# Patient Record
Sex: Female | Born: 1945 | Race: White | Hispanic: No | Smoking: Never smoker
Health system: Southern US, Community
[De-identification: ages and names within clinical notes are randomized; demographics above are authoritative.]

## PROBLEM LIST (undated history)

## (undated) DIAGNOSIS — Z87440 Personal history of urinary (tract) infections: Secondary | ICD-10-CM

## (undated) DIAGNOSIS — I10 Essential (primary) hypertension: Secondary | ICD-10-CM

## (undated) DIAGNOSIS — K222 Esophageal obstruction: Secondary | ICD-10-CM

## (undated) DIAGNOSIS — K219 Gastro-esophageal reflux disease without esophagitis: Secondary | ICD-10-CM

## (undated) DIAGNOSIS — K449 Diaphragmatic hernia without obstruction or gangrene: Secondary | ICD-10-CM

## (undated) DIAGNOSIS — Z85828 Personal history of other malignant neoplasm of skin: Secondary | ICD-10-CM

## (undated) DIAGNOSIS — Z8709 Personal history of other diseases of the respiratory system: Secondary | ICD-10-CM

## (undated) DIAGNOSIS — C801 Malignant (primary) neoplasm, unspecified: Secondary | ICD-10-CM

## (undated) DIAGNOSIS — F329 Major depressive disorder, single episode, unspecified: Secondary | ICD-10-CM

## (undated) DIAGNOSIS — Z973 Presence of spectacles and contact lenses: Secondary | ICD-10-CM

## (undated) DIAGNOSIS — R296 Repeated falls: Secondary | ICD-10-CM

## (undated) DIAGNOSIS — F32A Depression, unspecified: Secondary | ICD-10-CM

## (undated) DIAGNOSIS — S8291XA Unspecified fracture of right lower leg, initial encounter for closed fracture: Secondary | ICD-10-CM

## (undated) DIAGNOSIS — M1990 Unspecified osteoarthritis, unspecified site: Secondary | ICD-10-CM

## (undated) DIAGNOSIS — E785 Hyperlipidemia, unspecified: Secondary | ICD-10-CM

## (undated) DIAGNOSIS — J309 Allergic rhinitis, unspecified: Secondary | ICD-10-CM

## (undated) DIAGNOSIS — J189 Pneumonia, unspecified organism: Secondary | ICD-10-CM

## (undated) HISTORY — DX: Esophageal obstruction: K22.2

## (undated) HISTORY — DX: Essential (primary) hypertension: I10

## (undated) HISTORY — PX: HAND SURGERY: SHX662

## (undated) HISTORY — DX: Major depressive disorder, single episode, unspecified: F32.9

## (undated) HISTORY — DX: Depression, unspecified: F32.A

## (undated) HISTORY — DX: Unspecified osteoarthritis, unspecified site: M19.90

## (undated) HISTORY — PX: BLADDER SUSPENSION: SHX72

## (undated) HISTORY — DX: Hyperlipidemia, unspecified: E78.5

## (undated) HISTORY — PX: LATERAL COLLATERAL LIGAMENT REPAIR, KNEE: SHX1957

## (undated) HISTORY — DX: Allergic rhinitis, unspecified: J30.9

## (undated) HISTORY — PX: TOTAL ANKLE REPLACEMENT: SUR1218

## (undated) HISTORY — PX: TUBAL LIGATION: SHX77

## (undated) HISTORY — DX: Diaphragmatic hernia without obstruction or gangrene: K44.9

---

## 1975-02-25 HISTORY — PX: OTHER SURGICAL HISTORY: SHX169

## 1982-02-24 HISTORY — PX: APPENDECTOMY: SHX54

## 1998-11-07 ENCOUNTER — Other Ambulatory Visit: Admission: RE | Admit: 1998-11-07 | Discharge: 1998-11-07 | Payer: Self-pay | Admitting: Obstetrics and Gynecology

## 1999-10-30 ENCOUNTER — Encounter: Payer: Self-pay | Admitting: Gastroenterology

## 1999-11-07 ENCOUNTER — Other Ambulatory Visit: Admission: RE | Admit: 1999-11-07 | Discharge: 1999-11-07 | Payer: Self-pay | Admitting: Obstetrics and Gynecology

## 2000-06-08 ENCOUNTER — Encounter: Payer: Self-pay | Admitting: *Deleted

## 2000-06-09 ENCOUNTER — Encounter: Payer: Self-pay | Admitting: Orthopaedic Surgery

## 2000-06-09 ENCOUNTER — Inpatient Hospital Stay (HOSPITAL_COMMUNITY): Admission: EM | Admit: 2000-06-09 | Discharge: 2000-06-11 | Payer: Self-pay | Admitting: Emergency Medicine

## 2001-01-07 ENCOUNTER — Other Ambulatory Visit: Admission: RE | Admit: 2001-01-07 | Discharge: 2001-01-07 | Payer: Self-pay | Admitting: Obstetrics and Gynecology

## 2003-09-22 ENCOUNTER — Observation Stay (HOSPITAL_COMMUNITY): Admission: EM | Admit: 2003-09-22 | Discharge: 2003-09-22 | Payer: Self-pay | Admitting: Emergency Medicine

## 2004-04-24 HISTORY — PX: LAPAROSCOPIC CHOLECYSTECTOMY: SUR755

## 2004-04-30 ENCOUNTER — Ambulatory Visit: Payer: Self-pay | Admitting: Gastroenterology

## 2004-05-01 ENCOUNTER — Ambulatory Visit: Payer: Self-pay | Admitting: Gastroenterology

## 2004-05-01 ENCOUNTER — Ambulatory Visit (HOSPITAL_COMMUNITY): Admission: RE | Admit: 2004-05-01 | Discharge: 2004-05-01 | Payer: Self-pay | Admitting: Gastroenterology

## 2004-05-01 DIAGNOSIS — K2990 Gastroduodenitis, unspecified, without bleeding: Secondary | ICD-10-CM

## 2004-05-01 DIAGNOSIS — K2970 Gastritis, unspecified, without bleeding: Secondary | ICD-10-CM | POA: Insufficient documentation

## 2004-05-20 ENCOUNTER — Ambulatory Visit (HOSPITAL_COMMUNITY): Admission: RE | Admit: 2004-05-20 | Discharge: 2004-05-20 | Payer: Self-pay

## 2004-05-20 ENCOUNTER — Encounter (INDEPENDENT_AMBULATORY_CARE_PROVIDER_SITE_OTHER): Payer: Self-pay | Admitting: *Deleted

## 2004-06-13 ENCOUNTER — Ambulatory Visit: Payer: Self-pay | Admitting: Gastroenterology

## 2005-05-23 ENCOUNTER — Ambulatory Visit (HOSPITAL_BASED_OUTPATIENT_CLINIC_OR_DEPARTMENT_OTHER): Admission: RE | Admit: 2005-05-23 | Discharge: 2005-05-23 | Payer: Self-pay | Admitting: Urology

## 2006-03-09 ENCOUNTER — Encounter: Admission: RE | Admit: 2006-03-09 | Discharge: 2006-03-09 | Payer: Self-pay | Admitting: Family Medicine

## 2006-09-17 ENCOUNTER — Ambulatory Visit: Payer: Self-pay | Admitting: Gastroenterology

## 2006-09-17 LAB — CONVERTED CEMR LAB
ALT: 39 units/L — ABNORMAL HIGH (ref 0–35)
Basophils Relative: 0.7 % (ref 0.0–1.0)
HCT: 36 % (ref 36.0–46.0)
Hemoglobin: 12.9 g/dL (ref 12.0–15.0)
Lipase: 20 units/L (ref 11.0–59.0)
MCHC: 35.8 g/dL (ref 30.0–36.0)
MCV: 93.4 fL (ref 78.0–100.0)
Neutro Abs: 2.8 10*3/uL (ref 1.4–7.7)
Platelets: 289 10*3/uL (ref 150–400)
RBC: 3.86 M/uL — ABNORMAL LOW (ref 3.87–5.11)
Total Protein: 7.3 g/dL (ref 6.0–8.3)
WBC: 5.6 10*3/uL (ref 4.5–10.5)

## 2006-09-18 ENCOUNTER — Ambulatory Visit: Payer: Self-pay | Admitting: Gastroenterology

## 2006-10-08 ENCOUNTER — Ambulatory Visit: Payer: Self-pay | Admitting: Gastroenterology

## 2007-05-14 ENCOUNTER — Inpatient Hospital Stay (HOSPITAL_COMMUNITY): Admission: EM | Admit: 2007-05-14 | Discharge: 2007-05-16 | Payer: Self-pay | Admitting: Emergency Medicine

## 2007-06-02 DIAGNOSIS — R1013 Epigastric pain: Secondary | ICD-10-CM | POA: Insufficient documentation

## 2007-06-02 DIAGNOSIS — K449 Diaphragmatic hernia without obstruction or gangrene: Secondary | ICD-10-CM | POA: Insufficient documentation

## 2007-06-02 DIAGNOSIS — E7800 Pure hypercholesterolemia, unspecified: Secondary | ICD-10-CM | POA: Insufficient documentation

## 2007-06-02 DIAGNOSIS — K219 Gastro-esophageal reflux disease without esophagitis: Secondary | ICD-10-CM | POA: Insufficient documentation

## 2007-06-02 DIAGNOSIS — J309 Allergic rhinitis, unspecified: Secondary | ICD-10-CM | POA: Insufficient documentation

## 2007-06-02 DIAGNOSIS — I10 Essential (primary) hypertension: Secondary | ICD-10-CM | POA: Insufficient documentation

## 2007-07-05 ENCOUNTER — Ambulatory Visit (HOSPITAL_COMMUNITY): Admission: RE | Admit: 2007-07-05 | Discharge: 2007-07-05 | Payer: Self-pay | Admitting: Cardiology

## 2007-07-05 ENCOUNTER — Ambulatory Visit: Payer: Self-pay | Admitting: Vascular Surgery

## 2007-07-05 ENCOUNTER — Encounter (INDEPENDENT_AMBULATORY_CARE_PROVIDER_SITE_OTHER): Payer: Self-pay | Admitting: Cardiology

## 2009-01-17 ENCOUNTER — Ambulatory Visit (HOSPITAL_COMMUNITY): Admission: RE | Admit: 2009-01-17 | Discharge: 2009-01-19 | Payer: Self-pay | Admitting: Orthopedic Surgery

## 2010-04-09 ENCOUNTER — Encounter (INDEPENDENT_AMBULATORY_CARE_PROVIDER_SITE_OTHER): Payer: Self-pay | Admitting: *Deleted

## 2010-04-17 NOTE — Letter (Signed)
Summary: New Patient letter  [facility]  [address] [place], [place] [address]   Phone: [phone]  Fax: [phone]       04/09/2010 MRN: [id number]  [facility] [address] [place], [place]  [address]  Dear Ms. Rathe,  Welcome to the Gastroenterology Division at [facility].    You are scheduled to see Dr. Sheryn Bison on May 16, 2010 at 9:45am on the 3rd floor at [facility], [address]  We ask that you try to arrive at our office 15 minutes prior to your appointment time to allow for check-in.  We would like you to complete the enclosed self-administered evaluation form prior to your visit and bring it with you on the day of your appointment.  We will review it with you.  Also, please bring a complete list of all your medications or, if you prefer, bring the medication bottles and we will list them.  Please bring your insurance card so that we may make a copy of it.  If your insurance requires a referral to see a specialist, please bring your referral form from your primary care physician.  Co-payments are due at the time of your visit and may be paid by cash, check or credit card.     Your office visit will consist of a consult with your physician (includes a physical exam), any laboratory testing he/she may order, scheduling of any necessary diagnostic testing (e.g. x-ray, ultrasound, CT-scan), and scheduling of a procedure (e.g. Endoscopy, Colonoscopy) if required.  Please allow enough time on your schedule to allow for any/all of these possibilities.    If you cannot keep your appointment, please call [phone] to cancel or reschedule prior to your appointment date.  This allows [place] the opportunity to schedule an appointment for another patient in need of care.  If you do not cancel or reschedule by 5 p.m. the business day prior to your appointment date, you will be charged a $50.00 late cancellation/no-show fee.    Thank  you for choosing [facility] for your medical needs.  We appreciate the opportunity to care for you.  Please visit [place] at our website  to learn more about our practice.                     Sincerely,                                                             The Gastroenterology Division

## 2010-05-16 ENCOUNTER — Other Ambulatory Visit (INDEPENDENT_AMBULATORY_CARE_PROVIDER_SITE_OTHER): Payer: 59

## 2010-05-16 ENCOUNTER — Encounter: Payer: Self-pay | Admitting: Gastroenterology

## 2010-05-16 ENCOUNTER — Other Ambulatory Visit (INDEPENDENT_AMBULATORY_CARE_PROVIDER_SITE_OTHER): Payer: 59 | Admitting: Gastroenterology

## 2010-05-16 ENCOUNTER — Ambulatory Visit (INDEPENDENT_AMBULATORY_CARE_PROVIDER_SITE_OTHER): Payer: 59 | Admitting: Gastroenterology

## 2010-05-16 ENCOUNTER — Encounter: Payer: Self-pay | Admitting: *Deleted

## 2010-05-16 DIAGNOSIS — Z72820 Sleep deprivation: Secondary | ICD-10-CM

## 2010-05-16 DIAGNOSIS — K589 Irritable bowel syndrome without diarrhea: Secondary | ICD-10-CM

## 2010-05-16 DIAGNOSIS — R079 Chest pain, unspecified: Secondary | ICD-10-CM

## 2010-05-16 DIAGNOSIS — K219 Gastro-esophageal reflux disease without esophagitis: Secondary | ICD-10-CM

## 2010-05-16 DIAGNOSIS — Z7689 Persons encountering health services in other specified circumstances: Secondary | ICD-10-CM

## 2010-05-16 DIAGNOSIS — R159 Full incontinence of feces: Secondary | ICD-10-CM

## 2010-05-16 LAB — IBC PANEL
Saturation Ratios: 23.5 % (ref 20.0–50.0)
Transferrin: 304.5 mg/dL (ref 212.0–360.0)

## 2010-05-16 LAB — BASIC METABOLIC PANEL
CO2: 22 mEq/L (ref 19–32)
Chloride: 106 mEq/L (ref 96–112)
Creatinine, Ser: 0.8 mg/dL (ref 0.4–1.2)
Potassium: 4 mEq/L (ref 3.5–5.1)
Sodium: 136 mEq/L (ref 135–145)

## 2010-05-16 LAB — CBC WITH DIFFERENTIAL/PLATELET
Basophils Relative: 0.7 % (ref 0.0–3.0)
Eosinophils Absolute: 0.1 10*3/uL (ref 0.0–0.7)
Eosinophils Relative: 1.9 % (ref 0.0–5.0)
HCT: 39.3 % (ref 36.0–46.0)
Lymphs Abs: 2.5 10*3/uL (ref 0.7–4.0)
MCHC: 35.3 g/dL (ref 30.0–36.0)
MCV: 93.8 fl (ref 78.0–100.0)
Monocytes Absolute: 0.3 10*3/uL (ref 0.1–1.0)
Platelets: 254 10*3/uL (ref 150.0–400.0)
WBC: 5.6 10*3/uL (ref 4.5–10.5)

## 2010-05-16 LAB — HEPATIC FUNCTION PANEL
ALT: 37 U/L — ABNORMAL HIGH (ref 0–35)
AST: 28 U/L (ref 0–37)
Bilirubin, Direct: 0.1 mg/dL (ref 0.0–0.3)
Total Bilirubin: 0.8 mg/dL (ref 0.3–1.2)

## 2010-05-16 LAB — SEDIMENTATION RATE: Sed Rate: 15 mm/hr (ref 0–22)

## 2010-05-16 LAB — FERRITIN: Ferritin: 169 ng/mL (ref 10.0–291.0)

## 2010-05-16 LAB — TSH: TSH: 1.39 u[IU]/mL (ref 0.35–5.50)

## 2010-05-16 MED ORDER — ESOMEPRAZOLE MAGNESIUM 40 MG PO PACK: 40.0000 mg | PACK | Freq: Two times a day (BID) | ORAL | Status: DC

## 2010-05-16 MED ORDER — PEG-KCL-NACL-NASULF-NA ASC-C 100 G PO SOLR: 1.0000 | Freq: Once | ORAL | Status: AC

## 2010-05-16 MED ORDER — SUCRALFATE 1 GM/10ML PO SUSP: ORAL | Status: DC

## 2010-05-16 NOTE — Progress Notes (Signed)
History of Present Illness:  This is a  Pleasant 65 year old white female with multiple GI complaints. [name] saw her in 2006 at which time she had endoscopy and colonoscopy which were fairly unremarkable. At that time she was diagnosed as having symptomatic cholelithiasis and underwent laparoscopic cholecystectomy with Dr. Orson Slick. She currently is having rather severe substernal chest pain radiating  Into her neck and has intermittent solid food dysphagia, hoarseness, choking, S. Of cardiac evaluation by Dr. Julieanne Manson has been unremarkable. She also has insomnia A. Chronic fatigue and probably has sleep apnea. She denies anorexia or weight loss, for does have alternating diarrhea and constipation consistent with irritable bowel syndrome. She denies systemic or hepatobiliary complaints. She also denies any specific food intolerances.    she has noted intermittent fecal incontinency  Has had one child some 40 years ago. She denies rectal trauma or rectal surgery. She has depression and is currently on Effexor 75 mg 3 times a day per primary care. She has been on Nexium 40 mg a day for several years. Family history is noncontributory.    ROS: The remainder of the 8 point ROS is negative. There are multiple blunt complaints list of outpatient including anxiety and depression, low back pain, confusion, fatigue, nonspecific headaches, arise, shortness of breath with exertion, insomnia, swelling of her legs and feet, excessive thirst, excessive urination, horness, and occasional fecal and urine incontinency.    Past Medical History  Diagnosis Date  . Unspecified gastritis and gastroduodenitis without mention of hemorrhage   . Hiatal hernia   . Allergic rhinitis, cause unspecified   . Pure hypercholesterolemia   . Unspecified essential hypertension   . Esophageal reflux   . Abdominal pain, epigastric   . Arthritis   . Esophageal stenosis    Past Surgical History  Procedure Date  . Appendectomy     . Tubal ligation     76 reversal in 81 and tubal in 85  . Laparoscopic cholecystectomy 04/2004  . Laporoscopy 1977    ovarian cyst    reports that she has never smoked. She has never used smokeless tobacco. She reports that she drinks alcohol. She reports that she does not use illicit drugs. family history includes Diabetes in her sister; Heart disease in her brother; and Stomach cancer (age of onset:70) in her brother.  There is no history of Colon cancer. No Known Allergies     Physical Exam: General well developed well nourished patient in no acute distress, appearing their stated age Eyes PERRLA, no icterus fundoscopic exam per opthamologist Skin no lesions noted Neck supple, no adenopathy, no thyroid enlargement, no tenderness Chest clear to percussion and auscultation Heart no significant murmurs, gallops or rubs noted Abdomen no hepatosplenomegaly masses or tenderness, BS normal.  Rectal inspection normal no fissures, or fistulae noted.  No masses or tenderness on digital exam. Stool guaiac negative. There is reduced rectal tone and squeeze pressure noted. Extremities no acute joint lesions, edema, phlebitis or evidence of cellulitis. Neurologic patient oriented x 3, cranial nerves intact, no focal neurologic deficits noted. Psychological mental status normal and normal affect. 1 Assessment and plan: this patient has chronic IBS with alternating diarrhea and constipation. She also has gross severe acid reflux unresponsive to Nexium therapy. I have scheduled endoscopy and will screen for celiac disease, H. Pylori, and eosinophilic esophagitis. Increased her Nexium to 40 mg twice a day with when necessary Carafate suspension PC and each bedtime. followup colonoscopy also scheduled per her her abdominal symptomatology  most consistent with IBS. As mentioned above she is status post cholecystectomy. Symptomatology also is consistent with sleep apnea, I have scheduled her appointment Dr.  Shelle Iron  4 consideration of sleep apnea studies. Vascular not be within 2 hours of going to bed at night with other standard antireflux maneuvers. Numerous screening laboratory parameters also been ordered 4 review.

## 2010-05-16 NOTE — Patient Instructions (Signed)
Please go to the basement today for your labs.  Your prescription(s) have been sent to you pharmacy.  Your procedure has been scheduled for 05/17/2010, please follow the seperate instructions.  We have Scheduled you to see Dr Shelle Iron to discuss having a sleep study on 06/14/2010 arrive at 11:30am if you can not keep this appt please call [phone] to reschedule or cancel 24 hours in advance.  Increase your Nexium to twice a day dosing, a new rx has been sent.

## 2010-05-17 ENCOUNTER — Encounter: Payer: Self-pay | Admitting: Gastroenterology

## 2010-05-17 ENCOUNTER — Ambulatory Visit (AMBULATORY_SURGERY_CENTER): Payer: 59 | Admitting: Gastroenterology

## 2010-05-17 DIAGNOSIS — K222 Esophageal obstruction: Secondary | ICD-10-CM

## 2010-05-17 DIAGNOSIS — K449 Diaphragmatic hernia without obstruction or gangrene: Secondary | ICD-10-CM

## 2010-05-17 DIAGNOSIS — K589 Irritable bowel syndrome without diarrhea: Secondary | ICD-10-CM

## 2010-05-17 DIAGNOSIS — R1310 Dysphagia, unspecified: Secondary | ICD-10-CM

## 2010-05-17 DIAGNOSIS — R109 Unspecified abdominal pain: Secondary | ICD-10-CM

## 2010-05-17 DIAGNOSIS — Z1211 Encounter for screening for malignant neoplasm of colon: Secondary | ICD-10-CM

## 2010-05-17 DIAGNOSIS — K5730 Diverticulosis of large intestine without perforation or abscess without bleeding: Secondary | ICD-10-CM

## 2010-05-17 DIAGNOSIS — K219 Gastro-esophageal reflux disease without esophagitis: Secondary | ICD-10-CM

## 2010-05-17 DIAGNOSIS — K209 Esophagitis, unspecified without bleeding: Secondary | ICD-10-CM

## 2010-05-17 LAB — GLIA (IGA/G) + TTG IGA: Gliadin IgG: 6.2 U/mL (ref <20)

## 2010-05-17 NOTE — Patient Instructions (Signed)
Reviewed blue and green discharge sheets with patient and care partner.  Care partner signed discharge sheets.  Teaching Materials: Diverticulosis, High Fiber, GERD, Esophagitis, Dilation Diet  Findings:  Normal Colon except for the presence of diverticulosis.                  Recommended follow up 10 years.                  Hiatal Hernia, Esophagitis, Chronic GERD                  Probable stricture - dilated

## 2010-05-20 ENCOUNTER — Telehealth: Payer: Self-pay | Admitting: *Deleted

## 2010-05-20 DIAGNOSIS — R109 Unspecified abdominal pain: Secondary | ICD-10-CM

## 2010-05-20 LAB — HELICOBACTER PYLORI SCREEN-BIOPSY: UREASE: NEGATIVE

## 2010-05-20 NOTE — Telephone Encounter (Signed)

## 2010-05-22 ENCOUNTER — Encounter: Payer: Self-pay | Admitting: Gastroenterology

## 2010-05-23 NOTE — Procedures (Signed)
Summary: Colonoscopy  Patient: [name] Note: All result statuses are Final unless otherwise noted.  Tests: (1) Colonoscopy (COL)   COL Colonoscopy           DONE     [facility]     [address]     [place], [place]  [address]          COLONOSCOPY PROCEDURE REPORT          PATIENT:  [name], [name]  MR#:  [id number]     BIRTHDATE:  1945/08/11, 64 yrs. old  GENDER:  female     ENDOSCOPIST:  Vania Rea. Jarold Motto, MD, [facility]     REF. BY:     PROCEDURE DATE:  05/17/2010     PROCEDURE:  Average-risk screening colonoscopy     G0121     ASA CLASS:  Class II     INDICATIONS:  Routine Risk Screening     MEDICATIONS:   Fentanyl 75 mcg IV, Versed 10 mg IV          DESCRIPTION OF PROCEDURE:   After the risks benefits and     alternatives of the procedure were thoroughly explained, informed     consent was obtained.  Digital rectal exam was performed and     revealed no abnormalities.   The LB CF-H180AL [id number] endoscope     was introduced through the anus and advanced to the cecum, which     was identified by both the appendix and ileocecal valve, without     limitations.  The quality of the prep was excellent, using     MoviPrep.  The instrument was then slowly withdrawn as the colon     was fully examined.     <<PROCEDUREIMAGES>>          FINDINGS:  Mild diverticulosis was found in the sigmoid colon.     This was otherwise a normal examination of the colon.  No polyps or     cancers were seen.   Retroflexed views in the rectum revealed no     abnormalities.    The scope was then withdrawn from the patient     and the procedure completed.          COMPLICATIONS:  None     ENDOSCOPIC IMPRESSION:     1) Mild diverticulosis in the sigmoid colon     2) Otherwise normal examination     RECOMMENDATIONS:     1) Continue current colorectal screening recommendations for     "routine risk" patients with a repeat colonoscopy in 10 years.     REPEAT EXAM:  No       ______________________________     Vania Rea. Jarold Motto, MD, [facility]          CC:          n.     eSIGNED:   Vania Rea. Patterson at 05/17/2010 02:58 PM          [place], Beckstrom Dundarrach, [phone]  Note: An exclamation mark (!) indicates a result that was not dispersed into the flowsheet. Document Creation Date: 05/17/2010 2:59 PM _______________________________________________________________________  (1) Order result status: Final Collection or observation date-time: 05/17/2010 14:53 Requested date-time:  Receipt date-time:  Reported date-time:  Referring Physician:   Ordering Physician: Sheryn Bison [phone]) Specimen Source:  Source: Launa Grill Order Number: [id number] Lab site:

## 2010-05-23 NOTE — Procedures (Signed)
Summary: Upper Endoscopy  Patient: [name] Note: All result statuses are Final unless otherwise noted.  Tests: (1) Upper Endoscopy (EGD)   EGD Upper Endoscopy       DONE     [facility]     [address]     [place], [place]  [address]          ENDOSCOPY PROCEDURE REPORT          PATIENT:  [name], [name]  MR#:  [id number]     BIRTHDATE:  [birth year], 64 yrs. old  GENDER:  female          ENDOSCOPIST:  Vania Rea. Jarold Motto, MD, [facility]     Referred by:          PROCEDURE DATE:  05/17/2010     PROCEDURE:  EGD with biopsy, [address], Maloney Dilation of Esophagus     ASA CLASS:  Class II     INDICATIONS:  GERD, dysphagia          MEDICATIONS:   There was residual sedation effect present from     prior procedure., Fentanyl 25 mcg IV, Versed 3 mg IV     TOPICAL ANESTHETIC:  Exactacain Spray          DESCRIPTION OF PROCEDURE:   After the risks benefits and     alternatives of the procedure were thoroughly explained, informed     consent was obtained.  The LB GIF-H180 [id number] endoscope was     introduced through the mouth and advanced to the second portion of     the duodenum, without limitations.  The instrument was slowly     withdrawn as the mucosa was fully examined.     <<PROCEDUREIMAGES>>          A hiatal hernia was found.  Esophagitis was found at the     gastroesophageal junction. DILATED #21F MALONEY.TOLERATED WELL.     Normal duodenal folds were noted. si bx. done.    Retroflexed views     revealed a hiatal hernia.    The scope was then withdrawn from the     patient and the procedure completed.          COMPLICATIONS:  None          ENDOSCOPIC IMPRESSION:     1) Hiatal hernia     2) Esophagitis at the gastroesophageal junction     3) Normal duodenal folds     4) A hiatal hernia     CHRONIC GERD.PROBABLE STRICTURE DILATED.     RECOMMENDATIONS:     1) Await biopsy results     2) Clear liquids until, then soft foods rest iof day. Resume  prior diet tomorrow.     3) Rx CLO if positive     4) continue current medications          REPEAT EXAM:  No          ______________________________     Vania Rea. Jarold Motto, MD, [facility]          CC:          n.     eSIGNED:   Vania Rea. Kadee Philyaw at 05/17/2010 03:29 PM          [name], [name], [phone]  Note: An exclamation mark (!) indicates a result that was not dispersed into the flowsheet. Document Creation Date: 05/17/2010 3:29 PM _______________________________________________________________________  (1) Order result status: Final Collection or observation date-time: 05/17/2010 15:14 Requested date-time:  Receipt date-time:  Reported date-time:  Referring Physician:   Ordering Physician: Sheryn Bison [id number]) Specimen Source:  Source: Launa Grill Order Number: [id number] Lab site:

## 2010-05-24 ENCOUNTER — Telehealth: Payer: Self-pay | Admitting: Gastroenterology

## 2010-05-24 NOTE — Telephone Encounter (Signed)
Advised pt that her bx is neg for h pylori

## 2010-05-27 ENCOUNTER — Encounter: Payer: 59 | Admitting: Gastroenterology

## 2010-05-29 LAB — CBC
HCT: 38.4 % (ref 36.0–46.0)
Hemoglobin: 11.8 g/dL — ABNORMAL LOW (ref 12.0–15.0)
Hemoglobin: 13.3 g/dL (ref 12.0–15.0)
MCHC: 34.7 g/dL (ref 30.0–36.0)
MCV: 96.6 fL (ref 78.0–100.0)
Platelets: 258 10*3/uL (ref 150–400)
RBC: 3.48 MIL/uL — ABNORMAL LOW (ref 3.87–5.11)
RBC: 3.98 MIL/uL (ref 3.87–5.11)
RDW: 13.2 % (ref 11.5–15.5)
RDW: 13.4 % (ref 11.5–15.5)
WBC: 7 10*3/uL (ref 4.0–10.5)

## 2010-05-29 LAB — BASIC METABOLIC PANEL
BUN: 7 mg/dL (ref 6–23)
CO2: 25 mEq/L (ref 19–32)
Calcium: 8.8 mg/dL (ref 8.4–10.5)
Calcium: 9.7 mg/dL (ref 8.4–10.5)
Chloride: 106 mEq/L (ref 96–112)
Creatinine, Ser: 0.8 mg/dL (ref 0.4–1.2)
GFR calc Af Amer: >60 mL/min (ref >60)
GFR calc Af Amer: >60 mL/min (ref >60)
GFR calc non Af Amer: >60 mL/min (ref >60)
GFR calc non Af Amer: >60 mL/min (ref >60)
Glucose, Bld: 85 mg/dL (ref 70–99)
Potassium: 4.3 mEq/L (ref 3.5–5.1)
Sodium: 135 mEq/L (ref 135–145)
Sodium: 139 mEq/L (ref 135–145)

## 2010-05-29 LAB — D-DIMER, QUANTITATIVE: D-Dimer, Quant: 1.18 ug/mL-FEU — ABNORMAL HIGH (ref 0.00–0.48)

## 2010-05-29 LAB — BRAIN NATRIURETIC PEPTIDE: Pro B Natriuretic peptide (BNP): 64 pg/mL (ref 0.0–100.0)

## 2010-06-14 ENCOUNTER — Ambulatory Visit (INDEPENDENT_AMBULATORY_CARE_PROVIDER_SITE_OTHER): Payer: 59 | Admitting: Pulmonary Disease

## 2010-06-14 ENCOUNTER — Encounter: Payer: Self-pay | Admitting: Pulmonary Disease

## 2010-06-14 DIAGNOSIS — F5104 Psychophysiologic insomnia: Secondary | ICD-10-CM

## 2010-06-14 DIAGNOSIS — G4700 Insomnia, unspecified: Secondary | ICD-10-CM | POA: Insufficient documentation

## 2010-06-14 NOTE — Patient Instructions (Signed)
Start trazodone 50mg  2 tabs one or two hrs before bedtime Move bedtime to 11-12am, do not read or watch tv in bed If you cannot initiate sleep within , leave bedroom and watch tv or read in family room.  Do not use computer after 10pm.  Can return to bedroom when you begin to get sleepy.   Get up each am by 7:30, no napping during day, stay out of bedroom during day, and no exercise within 4 hrs of bedtime.  Work on weight loss If we can get you sleeping better, will do testing for sleep apnea.

## 2010-06-14 NOTE — Progress Notes (Signed)
  Subjective:    Patient ID: [name], female    DOB: 1945-07-11, 65 y.o.   MRN: [id number]  HPI The pt is a 64y/o female who I have been asked to see for sleep issues that she believes are due to insomnia.  She has had this for many years.  She goes to bed btw 9-10pm, and will usually read in bed.  She may take up to 3 hrs to fall asleep, or may never fall asleep the whole night.  She will usually give herself 30-31min to fall asleep while in bed, but then will then get up and go to family room to watch tv, read, or get on computer.  She will typically return to bed at 1am, will toss and turn until 2-3am, then will fall asleep until 730am 3/7 nights.  4/7 nights she continues to toss and turn until it is time to start her day.  She has a sense of frustration about going to sleep, and feels she cannot turn off her racing mind.  She has been tried on antihistamines, trazodone, and ambien without success.  She has been noted to have snoring, but never witnessed apneas.  She denies significant leg kicking or RLS symptoms.  She denies sleepiness during the day, and never takes naps.  She has no pets, and denies her husband snores.  She is not rested upon arising in the am's.  Her weight is up about 16 pounds over the last 31yrs.     Review of Systems  Constitutional: Positive for unexpected weight change. Negative for fever.  HENT: Positive for congestion, rhinorrhea, sneezing, postnasal drip and sinus pressure. Negative for ear pain, nosebleeds, sore throat, trouble swallowing and dental problem.   Eyes: Positive for redness and itching.  Respiratory: Positive for cough, chest tightness, shortness of breath and wheezing.   Cardiovascular: Negative for palpitations and leg swelling.  Gastrointestinal: Positive for nausea. Negative for vomiting.  Genitourinary: Negative for dysuria.  Musculoskeletal: Positive for joint swelling.  Skin: Positive for rash.  Neurological: Positive for headaches.    Hematological: Does not bruise/bleed easily.  Psychiatric/Behavioral: Positive for dysphoric mood. The patient is nervous/anxious.        Objective:   Physical Exam Constitutional:  Obese female, no acute distress  HENT:  Nares patent without discharge, but mildly narrowed  Oropharynx without exudate, palate and uvula are not significantly abnormal  Eyes:  Perrla, eomi, no scleral icterus  Neck:  No JVD, no TMG  Cardiovascular:  Normal rate, regular rhythm, no rubs or gallops.  No murmurs        Intact distal pulses  Pulmonary :  Normal breath sounds, no stridor or respiratory distress   No rales, rhonchi, or wheezing  Abdominal:  Soft, nondistended, bowel sounds present.  No tenderness noted.   Musculoskeletal:  No lower extremity edema noted.  Lymph Nodes:  No cervical lymphadenopathy noted  Skin:  No cyanosis noted  Neurologic:  Alert, appropriate, moves all 4 extremities without obvious deficit.         Assessment & Plan:

## 2010-06-18 ENCOUNTER — Telehealth: Payer: Self-pay | Admitting: Gastroenterology

## 2010-06-18 NOTE — Telephone Encounter (Signed)
LMOM for pt to call back.

## 2010-06-19 NOTE — Telephone Encounter (Signed)
Pt received a bill for 05/17/10 procedures-ECL- from Dr Arlyce Dice and wondered if he "put her to sleep"?  Dr Jarold Motto performed and signed off on the procedures, billing must have made a mistake. Pt will call the number listed for billing problems.

## 2010-06-23 NOTE — Assessment & Plan Note (Signed)
The pt is describing pyschophysiologic insomnia, and I have explained to her this is a learned behavior.  It is unclear if she has another sleep d/o superimposed such as osa or rls, but will have to treat her insomnia first and see how she responds.  I have told her that sleeping medications are never the answer for her problems, but may help short term when combined with behavioral therapy (the mainstay of treatment).  I have reviewed both stimulus control therapy and sleep restriction with her, as well as what constitutes good sleep hygiene.  She will try some of these techniques over the next 3 weeks to see if they help her.  If she is not making any progress, would recommend referral to behavioral specialist for cognitive behavioral therapy.

## 2010-06-28 ENCOUNTER — Ambulatory Visit: Payer: 59 | Admitting: Gastroenterology

## 2010-07-05 ENCOUNTER — Ambulatory Visit: Payer: 59 | Admitting: Pulmonary Disease

## 2010-07-09 NOTE — H&P (Signed)
[name]             ACCOUNT NO.:  [id number]   MEDICAL RECORD NO.:  [id number]          PATIENT TYPE:  INP   LOCATION:  1825                         FACILITY:  [facility]   PHYSICIAN:  Lonia Blood, M.D.DATE OF BIRTH:  10/08/1945   DATE OF ADMISSION:  05/14/2007  DATE OF DISCHARGE:                              HISTORY & PHYSICAL   PRIMARY CARE PHYSICIAN:  Dr. Bayard Beaver. Spear.   CHIEF COMPLAINT:  Left facial weakness and slurred speech.   HISTORY OF PRESENT ILLNESS:  [name] is a 65 year old female  with medical history as detailed below.  She was in her usual state of  health until today at 2:00 p.m.  She began to experience the sudden  onset of left-sided facial numbness.  This was affecting the entire left  side of her face.  Shortly thereafter she began to feel a facial droop  involving the left lateral aspect of the upper lip.  Shortly thereafter  she felt that her speech was slurred.  No one witnessed this directly.  She did speak multiple family members on the phone, however, who did  report that her speech was abnormal and that she did in fact sound to be  slurring her speech.  The patient reports that she had been recovering  from a viral upper respiratory infection lately.  She also has had some  intermittent left-sided flank and upper abdominal pain which she says is  subacute/semichronic.  The patient has had no chest pain.  There have  been no fevers or chills.  There has been no nausea or vomiting.  The  patient has not had similar symptoms.  There was no loss of sensation in  the arms or legs and no loss of strength in arms or legs.  There was no  gait abnormalities.  There was no loss of consciousness.   REVIEW OF SYSTEMS:  The patient reports that she had been severely  stressed.  Family members support this is well.  She also reports that  she is recently recovering from a viral upper respiratory type symptoms.  She also reports migratory  left low back, left lower quadrant and left  upper back crampy type musculoskeletal pains.  Comprehensive review of  systems is otherwise unremarkable.   PAST MEDICAL HISTORY:  1. Hyperlipidemia.  2. Hypertension.  3. Hiatal hernia with gastroesophageal reflux disease.  4. Open reduction and internal fixation of left trimalleolar ankle      fracture.  5. Status post cholecystectomy.  6. Degenerative joint disease of both knees.  7. Anxiety disorder.   OUTPATIENT MEDICATIONS:  1. Allegra daily.  2. Aspirin 81 mg daily.  3. Crestor 10 mg daily.  4. Effexor XR 37.5 mg daily.  5. Lisinopril 40 mg daily.  6. Nasonex daily.  7. Nexium daily.  8. Calcium plus D daily.  9. Xanax 0.5 mg b.i.d. p.r.n.   ALLERGIES:  NO KNOWN DRUG ALLERGIES.   FAMILY HISTORY:  The patient's family history is reviewed and is not  directly contributory to this admission.   SOCIAL HISTORY:  The patient is married.  She has a son.  She does not  smoke.  She does not drink alcohol to excess but does occasionally  partake of alcohol.   LAB REVIEW:  CBC is unremarkable.  Metabolic panel is unremarkable.  LFTs are unremarkable.  Albumin 4.2.  Coags are normal.  Point of care  cardiac markers are negative times one.  Alcohol level is 18 at time of  presentation.  CT scan of the head reveals no acute disease.   PHYSICAL EXAMINATION:  Temperature 97.2, blood pressure 147/73, heart  rate 92, respiratory rate 22, O2 saturation 98% on 3 liters per minute  nasal cannula.  GENERAL - Well-developed, obese female in no acute  respiratory distress.  HEENT: Normocephalic, atraumatic.  Pupils equal,  round, reactive to light and accommodation.  Extraocular muscles intact  bilaterally.  OC/OP clear.  NECK - No JVD.  No lymphadenopathy, no  thyromegaly.  LUNGS:  Clear to auscultation bilaterally without wheeze  or rhonchi.  CARDIOVASCULAR:  Regular rate and rhythm without murmur,  gallop or rub.  Normal S1 and S2.   ABDOMEN - Obese, soft, bowel sounds  present, no hepatosplenomegaly, no rebound or ascites.  EXTREMITIES:  There is no significant cyanosis, clubbing and edema bilateral lower  extremities.  NEUROLOGIC - Alert and oriented x4.  Cranial II-XII intact  bilaterally, 5/5 strength bilateral upper and lower extremities.  Intact  sensation judged throughout the Babinski.   ASSESSMENT/PLAN:  1. Left-sided facial weakness, slurred speech and numbness - Ms.      Jacobs presents with symptoms that are of concern for a transient      ischemic attack.  She will be admitted to rule out TIA.  She will      receive a full workup to include MRI and MRA of the head.  Must      also consider the possibility of a Bell's palsy.  Family reports a      significant history of severe anxiety/stress.  The possibility of      conversion disorder should also be considered, however I am      concerned that her symptoms are most consistent with a TIA.  We      will add Plavix to her typical aspirin a day regimen and follow up      lab results.  2. Hyperlipidemia - we will continue the patient's Crestor.  We will      check her fasting lipid panel and adjust her treatment as      necessary.  3. Hypertension - we will continue the patient's outpatient medical      regimen and follow her blood pressure closely.  4. Gastroesophageal reflux disease - we will dose the patient with      Protonix.  5. Anxiety disorder - we will continue Xanax and we will follow her      clinical course.      Lonia Blood, M.D.  Electronically Signed     JTM/MEDQ  D:  05/14/2007  T:  05/14/2007  Job:  [id number]

## 2010-07-09 NOTE — Assessment & Plan Note (Signed)
[facility]                         GASTROENTEROLOGY OFFICE NOTE   NAME:Folts, TACOYA ALTIZER                    MRN:          [id number]  DATE:09/17/2006                            DOB:          October 21, 1945    Ms. Whitacre is status post laparoscopic cholecystectomy in March of  2006.  She has done well since that time and has had chronic acid  reflux, for which she takes daily Nexium.  She recently, over the last  several months, has had a recurrent knot-like sensation in her right  upper quadrant with rather persistent nausea.  She has also had some  postoperative loose stools and diarrhea, especially if she eats fatty  foods.  She has had no melena or hematochezia, fever, chills, or any  specific hepatobiliary complaints.  Her appetite is good and her weight  is stable.  She denies true reflux or dysphagia.  She specifically  denies clay-colored stools, dark urine, icterus, fever, or chills.   MEDICATIONS:  1. In addition to her Nexium.  2. She takes daily Effexor.  3. Allegra.  4. Multivitamins.  5. Quinapril - hydrochlorothiazide 20/12.5 mg daily.   EXAM:  She is a healthy-appearing white female in no distress, appearing  her stated age.  She weighs 198 pounds and blood pressure is 118/82.  Pulse was 72 and  regular.  I could not appreciate stigmata of chronic liver disease.  Her abdominal  exam showed no real hepatosplenomegaly, and I could not feel any  abnormal mass in the right upper quadrant.  Abdominal exam otherwise is  normal and bowel sounds were normal.   ASSESSMENT:  Ms. Sachs has somewhat unusual pain, which really does  not sound like biliary colic.  As mentioned above, she is on regular  acid suppressive therapy and does not abuse NSAID.  She did have  previous colonoscopy 2 years ago that was unremarkable.   RECOMMENDATIONS:  1. Check liver profile, amylase, lipase, and CBC, along with upper      abdominal ultrasound exam to  exclude retained common duct stone.  2. Trial of Colestid 1 g mid morning for her bile-salt diarrhea.  3. Check in 2 to 3 weeks' time or p.r.n. depending on her clinical      workup and response.     Vania Rea. Jarold Motto, MD, Caleen Essex, FAGA  Electronically Signed    DRP/MedQ  DD: 09/17/2006  DT: 09/17/2006  Job #: [id number]   cc:   Lavonda Jumbo, M.D.  Lebron Conners, M.D.

## 2010-07-09 NOTE — Assessment & Plan Note (Signed)
[facility]                         GASTROENTEROLOGY OFFICE NOTE   NAME:[name], [name]                    MRN:          [id number]  DATE:10/08/2006                            DOB:          1945-10-01    Ms. Sollars's GI workup history been negative, including an ultrasound  of the abdomen with good pancreatic visualization.  She is concerned  because her brother died of pancreatic cancer.  Is status post  cholecystectomy and repeat ultrasound showed no evidence of common bile  duct enlargement.  Her liver tests were all normal except for an SGPT of  39, but she does have fatty infiltration of the liver associated with  her metabolic syndrome.   Her exam today shows rather exquisite tenderness over the left anterior  rib area.  Abdominal exam otherwise was unremarkable.  Vital signs were  normal.   ASSESSMENT:  I think Ms. 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   RECOMMENDATIONS:  1. Celebrex 200 mg twice a day while continuing Nexium therapy.  2. Orthopedic referral to Dr. Ranee Gosselin for consideration of      steroid injection to the costochondral area.     Vania Rea. Jarold Motto, MD, Caleen Essex, FAGA  Electronically Signed    DRP/MedQ  DD: 10/08/2006  DT: 10/09/2006  Job #: [id number]   cc:   Georges Lynch. Darrelyn Hillock, M.D.  Lavonda Jumbo, M.D.  Lebron Conners, M.D.

## 2010-07-09 NOTE — Discharge Summary (Signed)
Cassandra, Jacobs             ACCOUNT NO.:  [id number]   MEDICAL RECORD NO.:  [id number]          PATIENT TYPE:  INP   LOCATION:  3017                         FACILITY:  [facility]   PHYSICIAN:  Lonia Blood, M.D.DATE OF BIRTH:  Nov 06, 1945   DATE OF ADMISSION:  05/14/2007  DATE OF DISCHARGE:  05/16/2007                               DISCHARGE SUMMARY   PRIMARY CARE PHYSICIAN:  Tammy R. Collins Scotland, M.D.   DISCHARGE DIAGNOSES:  1. Left-sided facial weakness and slurred speech.      a.     Possible right brain transient ischemic attack.      b.     Complete workup negative for history for cerebrovascular       accident.      c.     Atherosclerotic disease noted with no focal stenosis.      d.     Plavix added to aspirin therapy with increase of lipid       lowering agent.      e.     Outpatient echocardiogram within 5-7 days recommended to       complete a workup.  2. Hypertension.  3. Gastroesophageal reflux disease with hiatal hernia.  4. Status post open reduction and internal fixation left trimalleolar      ankle fracture.  5. Status post cholecystectomy.  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  7. Anxiety disorder.   DISCHARGE MEDICATIONS:  1. Aspirin 81 mg p.o. daily.  2. Plavix 75 mg p.o. daily.  3. Crestor 20 mg p.o. daily.  4. Nexium 40 mg p.o. daily.  5. Effexor XR 37.5 mg p.o. daily.  6. Lisinopril 40 mg p.o. daily.  7. Nasonex inhaled daily.  8. Calcium plus D p.o. daily.  9. Xanax 0.5 mg p.o. b.i.d.   FOLLOWUP:  The patient is advised to follow up with her primary care  physician, Dr. Herb Grays, in 5-7 days.  At that time the patient  should be set up for an outpatient echocardiogram to rule out valvular  heart disease or intracardiac source of thrombi.  This is felt to be  exceedingly unlikely as the patient was in normal sinus rhythm  throughout her entire hospital stay and there are no appreciable murmurs  on exam.  This is recommended,  however, to simply complete the entire  inpatient stroke evaluation.  Also LFTs should be obtained at  approximately 3 weeks to assure the patient is tolerating her increased  dose of Crestor.  They were normal during hospital stay.   HOSPITAL CONSULTATIONS:  None.   PROCEDURES:  1. CT scan of the head on May 14, 2007, negative noncontrasted CT      scan of the head.  2. MRI and MRA of the head and neck May 15, 2007, mild atrophy and      small vessel disease.  No acute intracranial findings.  Mild      extracranial atherosclerotic type change without flow limiting      stenosis.  Mild intracranial atherosclerotic change without      significant stenosis intracranially.   HOSPITAL COURSE:  Ms. [name] is a very pleasant 65 year old  female who was admitted to the hospital on May 14, 2007, on suspicion  of TIA.  She presented with left-sided facial weakness and slurred  speech which lasted a number of hours and then resolved spontaneously.  By the time that she was seen by the admitting physician her symptoms  had completely resolved.  She was admitted for a full evaluation of TIA.  She passed a swallowing screen at the time of her admission.  Homocysteine level was found to be unremarkable.  CBC, CMET, coags and  UA were all unremarkable as well.  Fasting lipid panel revealed elevated  LDL in excess of 100 and therefore the patient's Crestor was increased.  LFTs were noted to be normal.  CT scan of the head was in fact normal.  MRI and MRA of the head and neck was carried out.  Results of this were  as noted above.  In summary there were no acute deficits appreciated.  The patient remained in her normal state throughout hospitalization with  no recurrence of symptoms.  Blood pressure was well-controlled.  Vital  signs were stable.  Carotid Dopplers had been ordered but given the  successful viewing of the carotids with the MRA of the neck it was not  felt that these  were indicated.  The patient was monitored on tele and  remained in normal sinus rhythm throughout her hospitalization without  arrhythmia.  Auscultation of the chest failed to reveal any evidence of  cardiac abnormality such as murmur, gallop or rub.  By March 22 the  patient was perfectly clinically stable and desired discharge home.  It  was not felt clinically indicated to keep the patient in the hospital  for an additional 48 hours that would be required to have an echo  obtained and read, in the absence of evidence of heart disease or  predisposing factors for intracardiac source of thrombus.  As a simple  measure of completeness it is recommended that this be accomplished in  the outpatient setting.  I have discussed this option with the patient  and she is understanding of the minimal risk involved and would like to  pursue an outpatient echo versus prolonging her inpatient stay for such.  Due to the noted atherosclerotic disease on MRI and MRA Plavix was added  to the patient's aspirin regimen.  She will continue this in the  outpatient setting.   On May 16, 2007, the patient is deemed to be stable medically.  Vital  signs were stable.  She is afebrile.  Physical exam is completely  unremarkable.  She is cleared for discharge with followup with her  primary care physician as discussed above.      Lonia Blood, M.D.  Electronically Signed     JTM/MEDQ  D:  05/16/2007  T:  05/16/2007  Job:  [id number]   cc:   Tammy R. Collins Scotland, M.D.

## 2010-07-12 ENCOUNTER — Other Ambulatory Visit: Payer: Self-pay | Admitting: Dermatology

## 2010-07-12 NOTE — Op Note (Signed)
[facility]  Patient:    [name], [name]                        MRN: [id number] Proc. Date: 06/09/00 Adm. Date:  [id number] Attending:  Randolm Idol                           Operative Report  PREOPERATIVE DIAGNOSIS:       Displaced bimalleolar fracture, left ankle with                               diastasis.  POSTOPERATIVE DIAGNOSIS:      Displaced bimalleolar fracture, left ankle with                               diastasis.  OPERATION:                    Open reduction and internal fixation.  SURGEON:                      Claude Manges. Cleophas Dunker, M.D.  ASSISTANT:                    Joan Mayans, P.A.-C.  ANESTHESIA:                   General orotracheal.  COMPLICATIONS:                None.  DESCRIPTION OF PROCEDURE:     With the patient comfortable on the operating table and under general orotracheal anesthesia, the left lower extremity was placed in a thigh tourniquet.  The previously applied posterior splint was removed and the leg was then prepped with Betadine scrub and DuraPrep from the tips of her toes to the knees.  Sterile draping was performed.  With the extremity still elevated, it was Esmarch exsanguinated with the proximal tourniquet at 350 mmHg.  The patients ankle had been reduced in the emergency room last evening. There was posterolateral position of the talus which was now in the ankle mortis, and the ankle appeared to be relatively stable.  The initial incision was made longitudinally over the fibula via sharp dissection and carried down to the subcutaneous tissue.  By blunt dissection, the soft tissue was then elevated off the fibula and retractors were inserted. The fibular fracture was significantly comminuted.  The distal extent of the fracture was just proximal to the ankle joint.  There were numerous segmental fractures over an area of about 3-4 inches.  A 9-hole titanium Ace fibular plate was applied after  reduction of the fracture, maintaining the reduction of bone clamps.  The plate was bent to conform to the shape of the fibula. The two distal screws were drilled, measured, and filled with self-tapping cancellous screws with excellent purchase.  The third hole and the one more proximal to the fracture site in the distal three segments was left open.  Five of the remaining six holes proximal to the fracture were then drilled, measured, and filled with cortical screws.  The more distal screws purchased the segmental fractures.  I thought the alignment looked quite good.  With the ankle hyper-dorsiflexion and maintaining compression across the diastasis, a drill hole was then placed through the first screw hole  distal to the fracture site at the level of the supramalleolar region.  I measured a 60 mm screw and a capped cortical screw was then placed across the fibula into the tibia with excellent purchase.  I obtained x-rays and felt that I had reduced the diastasis with symmetrical spaces both medially and laterally.  An oblique incision was then made over the medial malleolus by sharp dissection and carried down to the subcutaneous tissue, and then via blunt dissection, the soft tissue was elevated off the malleolus.  A portion of the posterior tibial tendon had invaginated into the fracture as well as portions of the periosteum.  These were removed.  The fracture was reduced.  A K-wire was placed across the fracture site.  We had excellent purchase with the guide pin.  We then drilled 60 mm and inserted a 60 mm cancellous screw with excellent purchase.  There was absolutely no motion across the fracture site.  I then checked the fracture again with image intensification in AP and lateral projections.  I felt we had excellent position.  The trimalleolar portion or posterior tibial plafond portion was anatomically reduced.  The wounds were irrigated, the periosteum closed with 3-0  Vicryl, the subcutaneous with 3-0 Vicryl, and the skin closed with skin clips.  Marcaine 0.25% without epinephrine was injected into the wound edges.  A sterile bulky dressing was applied.  The tourniquet was deflated with immediate capillary refill to the toes.  Posterior splints were then applied with Ace bandages.  The patient tolerated the procedure without complications. DD:  06/09/00 TD:  06/10/00 Job: [id number]

## 2010-07-12 NOTE — Consult Note (Signed)
[name], [name]                         ACCOUNT NO.:  [id number]   MEDICAL RECORD NO.:  [id number]                   PATIENT TYPE:  INP   LOCATION:  5511                                 FACILITY:  [facility]   PHYSICIAN:  Francisca December, M.D.               DATE OF BIRTH:  1945-11-15   DATE OF CONSULTATION:  09/22/2003  DATE OF DISCHARGE:                                   CONSULTATION   REASON FOR CONSULTATION:  Chest pain.   HISTORY OF PRESENT ILLNESS:  [name] is a 65 year old woman without  prior cardiac history who presented to [facility] emergency room with acute  episode of substernal chest pain/pressure which was crushing in nature.  It  was her first episode of chest pain experienced.  She has a history of GERD  but her prior symptoms were not like this.  Her chest pain duration was  about 15 minutes and she took three baby aspirin with some decrease in the  pain.  The pain radiated to the left anterior shoulder, up in the left jaw,  and left back.  She came to the emergency room and by the time of arrival  her chest pain had mostly resolved.  ECGs were negative for ischemia.  Cardiac enzymes have been negative x3.  There was some mild nausea with her  chest pressure and mild shortness of breath.   PAST MEDICAL HISTORY:  1. GERD/hiatal hernia.  2. Hypertension.  3. Dyslipidemia.  4. Anxiety.   FAMILY HISTORY:  Father had an enlarged heart.  Brother had bypass surgery  and is a smoker.   HOME MEDICATIONS:  Zocor and Nexium.   DRUG ALLERGIES:  None known.   SOCIAL HISTORY:  No history of alcohol or tobacco abuse.  She is married.   REVIEW OF SYMPTOMS:  Recently weaning off Effexor without history of  tachypalpitations.  No fevers or other constitutional symptoms.  No dyspnea,  orthopnea, or PND.  Has not had any abdominal pain.  She has had no change  in her exercise tolerance, usually does exercise daily without symptoms.  No  dyspnea, no fatigue.  She has no  lower extremity edema.   PHYSICAL EXAMINATION:  VITAL SIGNS:  Her blood pressure is 127/57, pulse 82,  respirations 18, temperature 97.9.  GENERAL:  This is a pleasant, well-appearing, mildly-obese 65 year old woman  in no distress.  HEENT:  Unremarkable.  Head is atraumatic and normocephalic.  The pupils are  equal, round, react to light and accommodation.  Extraocular movements are  intact.  Oral mucosa is pink and moist.  Tongue is not coated.  NECK:  Supple without thyromegaly or masses.  The carotid upstrokes are  normal.  There is no bruit, there is no jugular venous distention.  CHEST:  Clear with adequate excursion, normal vesicular breath sounds are  heard throughout.  The precordium is quiet.  Normal S1 and S2.  A  soft  ejection systolic murmur present along the left sternal border.  PMI is not  palpable.  ABDOMEN:  Soft, nontender, without hepatosplenomegaly or midline pulsatile  mass.  Bowel sounds present in all quadrants.  GENITOURINARY:  External genitalia is without lesions.  RECTAL:  Not performed.  EXTREMITIES:  Show full range of motion, no edema, and intact distal pulses.  NEUROLOGIC:  Cranial nerves II-XII are intact.  Motor and sensory grossly  intact. Gait not tested.  SKIN:  Warm, dry, and clear.   ACCESSORY CLINICAL DATA:  Hemoglobin is 11.9, hematocrit 35.1.  Serum  electrolytes:  BUN, creatinine, glucose are normal.  CK-MB, troponin,  myoglobin all negative x3 (point of care enzymes).   Electrocardiogram normal.   Exercise stress test:  The patient exercised into the third stage of Bruce  protocol, achieved target heart rate.  There was mild left anterior chest  discomfort during exercise, resolved 4 minutes into recovery.  There were no  diagnostic electrocardiographic changes.  Subsequent Cardiolite images show  no perfusion defect.  There is a mild fixed defect from breast attenuation  anteriorly.  LV EF 62%.   IMPRESSION:  Noncardiac chest pain also  quite anginal in nature.  Probably  secondary to gastroesophageal reflux disease.   PLAN/RECOMMENDATION:  1. Reassurance given.  Would continue Nexium, perhaps increase to twice     daily.  2. No restrictions on activity level.  3. Would be happy to reevaluate in the future if chest discomfort continues     at either her discretion or Dr. Delford Field.                                               Francisca December, M.D.    JHE/MEDQ  D:  09/22/2003  T:  09/22/2003  Job:  [id number]   cc:   Angeline Slim, M.D.

## 2010-07-12 NOTE — Discharge Summary (Signed)
[name], [name]                         ACCOUNT NO.:  [id number]   MEDICAL RECORD NO.:  [id number]                   PATIENT TYPE:  INP   LOCATION:  5511                                 FACILITY:  [facility]   PHYSICIAN:  Isla Pence, M.D.             DATE OF BIRTH:  [birth year]   DATE OF ADMISSION:  09/21/2003  DATE OF DISCHARGE:  09/22/2003                                 DISCHARGE SUMMARY   DISCHARGE DIAGNOSES:  1. Noncardiac chest pain with negative stress Cardiolite and negative     cardiac enzymes.  2. History of hiatal hernia.  3. History of hypertension.  4. History of hyperlipidemia.  5. Previous history of anxiety disorder.   DISCHARGE MEDICATIONS:  The patient is to resume all of her home medications  including Tenoretic which she could not tell what the dose was, Zocor, and  Nexium; however, the Nexium she has been advised to increase it to twice a  day to see if it would be any help with her pain.  Although this pain was a  one-time episode.  I have also advised the patient to do Ecotrin 81 mg p.o.  q.d.   ACTIVITY:  as tolerated.   DIET:  Low fat and low salt.   FOLLOW UP:  Follow up with Dr. Lavonda Jumbo, her primary care physician, in  one to two weeks.   HOSPITAL COURSE:  This 65 year old female, with history of hypertension and  hyperlipidemia, was admitted to the [facility] service with classic  anginal-type of symptoms with midsternal chest pressure that happened on  awakening.  There was no associated shortness of breath but she did have  mild diaphoresis with radiation of the pain into her left arm and into her  left jaw.  She did feel a little nauseated.   She at that time took three aspirin and came into the emergency room.  In  any case, the patient was admitted on the telemetry service.  Cardiac  enzymes were obtained and all two have been negatives.  Her cardiac markers  have been negative.  Because of the nature of the pain, cardiology  was  consulted for a stress test and she underwent the stress Cardiolite earlier  today and cardiology came back after reviewing the nuclear imaging studies  to say that it was negative.  There was no suggestion of reversible  ischemia.  Therefore, the patient was deemed safe for discharge.  The  patient is also anxious about wanting to get home.  The patient has not had  any more chest pain since her admission here.   The cardiologist questions as to whether she might have a component of  gastroesophageal reflux disease contributing towards her symptoms and she  does have the hiatal hernia and therefore recommended the possibility of  increasing her Nexium to b.i.d.  I have gone ahead and advised her to do so.  Of mention, the EF per the cardiology note on the stress Cardiolite was 62%  and there was mild breast attenuation noted.   LABORATORY DATA:  Her admission labs showed H&H of 11.9 and 35.  Sodium was  139, potassium 3.7, chloride was 106, glucose of 94.  BUN and creatinine  were normal at 14 and 1.1.  As mentioned earlier, the cardiac markers showed  a MB of less than 1.  Her myoglobin was 47.7 on the second one.  The first  one was 49.9.  Her cardiac panel showed a total CPK of the max of 100.  MB  max was 1.2.  Troponin was 0.01 and 0.02 respectively on the first and  second.   Chest x-ray showed no acute disease.  It was essentially read as a normal  chest x-ray.   The patient is being discharged to home in stable condition with follow up  as previously mentioned and once again the patient is also anxious and  wanting to go home.                                                Isla Pence, M.D.    RRV/MEDQ  D:  09/22/2003  T:  09/23/2003  Job:  [id number]   cc:   Lavonda Jumbo, M.D.  [address] [place], [place] [address]  Fax: [phone]

## 2010-07-12 NOTE — Op Note (Signed)
[name], [name]             ACCOUNT NO.:  [id number]   MEDICAL RECORD NO.:  [id number]          PATIENT TYPE:  AMB   LOCATION:  DAY                          FACILITY:  [facility]   PHYSICIAN:  Lorre Munroe., M.D.DATE OF BIRTH:  30-Dec-1945   DATE OF PROCEDURE:  05/20/2004  DATE OF DISCHARGE:                                 OPERATIVE REPORT   PREOPERATIVE DIAGNOSIS:  Symptomatic gallstones.   POSTOPERATIVE DIAGNOSIS:  Symptomatic gallstones.   OPERATION:  Laparoscopic cholecystectomy.   SURGEON:  Lebron Conners, M.D.   ASSISTANT:  Anselm Pancoast. Zachery Dakins, M.D.   ANESTHESIA:  General.   PROCEDURE:  After the patient was monitored and anesthetized and had routine  preparation and draping of the abdomen, I infiltrated local anesthetic just  below the umbilicus and made a short transverse incision and dissected down  through the fat into the fascia and incised the fascia in the midline, then  bluntly entered the peritoneum.  There were a few fatty adhesions in that  region, and I broke those down with my finger.  I placed a 0 Vicryl  pursestring suture in the fascia, and then secured a Hasson cannula and  inflated the abdomen with CO2.  I put in the camera and saw that there was  some evidence of chronic inflammation of the gallbladder, and there were  some adhesions in the lower abdomen due to previous surgery.  Otherwise,  everything had a normal appearance.  I anesthetized three additional spots  and placed a 10 mm epigastric port and two 5 mm right abdominal ports.  I  then positioned the patient head up, foot down, and tilted to the left.  Retracting the gallbladder toward the right shoulder, I took down the  adhesions until I clearly saw the infundibulum of the gallbladder, and I  grasped that and retracted it to the right.  I then dissected the  hepatoduodenal ligament, incising the anterior peritoneum and dissecting out  structures which were clearly the cystic duct  emerging from the infundibulum  of the gallbladder, and clearly the cystic artery traversing the triangle of  Calot.  I clipped the cystic duct with four clips and cut between the two  closest to the gallbladder, and clipped the cystic artery with three clips  and cut between the two closest to the gallbladder.  I found one other small  vessel and clipped and divided that as well.  I then used the cautery to  dissect the gallbladder out of the fossa and the liver.  It was very thin  walled, and I accidentally made a couple of small holes in it.  Some bile  spilled, and I suctioned that away and irrigated that away and removed the  irrigant, but no stones spilled out.  After detaching the gallbladder from  the liver, I got good hemostasis in the gallbladder fossa, and I checked the  clips to ensure security.  I then placed the gallbladder in a plastic pouch  and removed it through the umbilical incision and tied the pursestring  suture.  I checked the area to make sure there  was no visceral injury, and I  saw no sign of that.  I  then removed the two lateral ports under direct vision and saw no bleeding  from the abdominal wall.  I allowed the CO2 to escape and removed the  epigastric port, and then closed all skin incisions with intracuticular 4-0  Vicryl and Steri-Strips.  The patient was stable throughout the procedure  and went to PACU in good condition.      WB/MEDQ  D:  05/20/2004  T:  05/20/2004  Job:  [id number]   cc:   Lavonda Jumbo, M.D.  [address] [place], [place] [address]  Fax: [phone]   Vania Rea. Jarold Motto, M.D. [facility]

## 2010-07-12 NOTE — Op Note (Signed)
[name], [name]             ACCOUNT NO.:  [id number]   MEDICAL RECORD NO.:  [id number]          PATIENT TYPE:  AMB   LOCATION:  [facility]                         FACILITY:  [facility]   PHYSICIAN:  Mark C. Vernie Ammons, M.D.  DATE OF BIRTH:  1945/12/25   DATE OF PROCEDURE:  05/23/2005  DATE OF DISCHARGE:                                 OPERATIVE REPORT   PREOP DIAGNOSIS:  Stress urinary continence.   POSTOP DIAGNOSIS:  Stress urinary continence.   PROCEDURE:  Transobturator sling.   SURGEON:  Mark C. Vernie Ammons, M.D.   Threasa HeadsLudger Nutting, MD   ANESTHESIA:  General.   SPECIMENS:  None.   BLOOD LOSS:  Approximately 50 mL.   DRAINS:  None.   COMPLICATIONS:  None.   INDICATIONS:  The patient is a 65 year old white female with stress urinary  incontinence.  She was found on CMG to have a stable bladder with normal  capacity.  She had a grade 1 cystocele as well.  We discussed the treatment  options.  She has elected to proceed with transobturator sling; and  understands the risks, complications, and alternatives.   DESCRIPTION OF OPERATION:  After informed consent, the patient brought to  the major OR, placed on the table, administered general anesthesia, then  moved to the dorsal lithotomy position.  Genitalia and vagina were sterilely  prepped and draped.  A 16-French Foley catheter was placed in the bladder;  and a weighted speculum was placed in the vagina.  Then 1% lidocaine with  epinephrine was used to the infiltrate the subvaginal mucosa in the midline  over the urethra; and then a midline incision was made over the mid urethral  level.  Sharp dissection was then undertaken on right and left sides,  dissecting beneath the vaginal mucosa.  This allowed palpation of the mid  urethra easily and an index finger could be placed on each side with the  undersurface of the symphysis pubis easily palpable.   Stab incisions were then made over the obturator fossae on the right  and  left sides, 5 mm lateral to the midline at the level of the clitoris.  The  bladder was then fully drained through the Foley catheter and the  transobturator trocar was then passed through the skin incision, through the  transobturator fascia, and obturator fossa, and back behind the symphysis  pubis and directed out at the mid urethral level through the vaginal  incision.  This was performed first on the left, then right sides.  The  sling material was affixed to the obturators and brought back through the  skin incisions.   The Foley catheter was then removed; and the 22-French rigid cystoscope with  70-degree lens was inserted into the bladder.  The bladder was noted be free  of any tumor, stones, or inflammatory lesions.  Ureteral orifices had normal  configuration and position.  There was no evidence of perforation, injury,  foreign bodies, or other abnormality.  The urethra appeared normal as well.   Foley catheter was replaced; the sling was then adjusted; and the plastic  coating removed  first on right then left sides.  I made sure that there was  no tension on the sling material at the mid urethral level.  I then  irrigated this location as well as both stab incisions with antibiotic  solution and excised the excess sling material.  The skin incisions were  then closed with Dermabond.  The vaginal incision was then closed with a  running 2-0 Vicryl suture.  The Foley catheter was removed; and the patient  was taken to recovery room in stable satisfactory condition.  She tolerated  the procedure well with no intraoperative complications.  She will be  observed in the recovery room and discharged home after she voids.  She will  follow up my office in 1 week for recheck; and she will be given a  prescription for Cipro 500 mg b.i.d. for 5 days and Vicodin HP #38.      Mark C. Vernie Ammons, M.D.  Electronically Signed     MCO/MEDQ  D:  05/23/2005  T:  05/26/2005  Job:   [id number]

## 2010-07-12 NOTE — Discharge Summary (Signed)
[facility]  Patient:    [name], [name]                        MRN: [id number] Adm. Date:  [id number] Disc. Date: [id number] Attending:  Randolm Idol Dictator:   Jamelle Rushing, P.A.                           Discharge Summary  ADMISSION DIAGNOSES: 1. Left trimalleolar ankle fracture. 2. Hypertension. 3. Gastroesophageal reflux disease. 4. Menopause.  DISCHARGE DIAGNOSES: 1. Status post open reduction and internal fixation of left trimalleolar ankle    fracture. 2. Hypertension. 3. Gastroesophageal reflux disease.  HISTORY OF PRESENT ILLNESS:  The patient is a 65 year old white female who slipped on the wet grass on the date of admission.  Patient fell on her left ankle having significant deformity and unable to stand.  Patient was brought to the emergency room for evaluation where x-rays revealed a left trimalleolar ankle fracture.  HOSPITAL COURSE:  Patient was admitted for pain management and planning ORIF of her left ankle.  In the emergency room, patient did have some local analgesia and had her ankle reduced by Dr. Norlene Campbell.  The patient was then placed in a posterior splint and transferred to the orthopedic floor to await a surgical time for ORIF of her ankle.  MEDICATIONS: 1. ______ 25 mg p.o. q.d. q.d. 2. Premarin 0.625 mg p.o. q.d. 3. Vitamin E, B, C, and soy.  DRUG ALLERGIES:  No known drug allergies.  SURGICAL PROCEDURE:  On June 09, 2000, patient was taken to the OR by Dr. Norlene Campbell, assisted by Jamelle Rushing, P.A.-C.  Under general anesthesia, an ORIF of her left trimalleolar fracture was performed.  Patient tolerated the surgical procedure well, there were no complications, and she was placed in a posterior splint after the surgical procedure and transferred to the recovery room and then to the orthopedic floor in stable condition.  CONSULTS:  On April 16, a physical therapy consult was requested.  The  patient did one course of physical therapy for crutch ambulation and, then, she refused any further physical therapy attempts.  HOSPITAL COURSE:  On June 09, 2000, patient was admitted to [facility] under the care of Dr. Norlene Campbell.  Patient was seen in the emergency room where she was evaluated for a left trimalleolar fracture which was dislocated.  The patient had local anesthetic applied and reduced in the ER by Dr. Norlene Campbell.  The patient was placed in the posterior splint, then transferred to the orthopedics floor for further awaiting of a surgical time.  Patient had surgical procedure for ORIF of her anle on April 16.  The patient tolerated this well and, then, had a two-day postoperative course in which she worked well with physical therapy on one attempt but then refused on any further attempts.  The patients initial pain management with PCA morphine was not controlling her very well, so she was changed to OxyContin CR 10 mg and Percocet p.r.n. breakthrough pain, and this did very well.  The patient initially had some problems with nausea, but this improved over the first postoperative day.  Patient was ready to be discharged home on postoperative day #2.  Her foot was neuromotor, vascularly intact.  The splint was a good fit.  The patient refused further home health physical therapy.  Patient was discharged to home in good condition.  LABORATORY DATA:  On June 10, 2000, CBC - WBC was 11.4, hemoglobin 11.4, hematocrit 32.1, platelets 317.  Routine chemistries on admission were all within normal limits with glucose of 125.  A urinalysis on admission was normal.  EKG on admission had a ventricular rate of 85 beats per minute with an unusual P axis, possible ectopic atrial rhythm.  I was unable to rule out anterior infarct, age undetermined.  MEDICATIONS ON DISCHARGE: 1. Continue routine home medications. 2. OxyContin CR 10 mg one tablet every 12  hours. 3. Percocet 5 mg 100 tablets every 4 to 6 hours for breakthrough pain if    needed.  ACTIVITY:  Rest with foot elevated as much as possible.  Patient is to maintain touchdown weightbearing of the left foot with the use of crutches.  DIET:  No restrictions.  WOUND CARE:  Patient is to keep splint clean and dry.  FOLLOW-UP:  Patient is to call for a follow-up appointment on the next Monday with Dr. Cleophas Dunker.  CONDITION ON DISCHARGE:  Good. DD:  06/11/00 TD:  06/11/00 Job: [id number]

## 2010-07-12 NOTE — H&P (Signed)
NAMEMENDI, CONSTABLE                         ACCOUNT NO.:  [id number]   MEDICAL RECORD NO.:  [id number]                   PATIENT TYPE:  EMS   LOCATION:  [facility]                                 FACILITY:  [facility]   PHYSICIAN:  Hollice Espy, M.D.            DATE OF BIRTH:  October 12, 1945   DATE OF ADMISSION:  09/21/2003  DATE OF DISCHARGE:                                HISTORY & PHYSICAL   ATTENDING PHYSICIAN:  Isla Pence, M.D.   PRIMARY CARE PHYSICIAN:  Lavonda Jumbo, M.D.   CHIEF COMPLAINT:  Chest pain.   HISTORY OF PRESENT ILLNESS:  The patient is a 65 year old white female with  a past medical history of hyperlipidemia and hypertension who presents with  a one day history of left-sided and mid sternal chest pressure. She states  that has been previously doing well with no complaints of previous episodes  of chest pain. In the late afternoon she started having some chest  discomfort described as a heavy chest pressure, crushing sensation which  also radiated to her left shoulder and to the left side of her jaw. She felt  diaphoretic and slight nauseous, but she denied any syncope or shortness of  breath. She became concerned, took three aspirin, and came to the emergency  room. There in the emergency room she had an EKG, cardiac enzymes, and a  standard set of labs ordered. Her initial set of cardiac enzymes drawn at  approximately 7:30 p.m. were normal. In addition, her EKG showed no evidence  of ST-wave changes or depression. It was read as a normal sinus rhythm. The  patient was given nitroglycerin and IV fluids in the emergency room. She  states that her chest pressure greatly decreased, but she could fully say it  was secondary to nitroglycerin or just on its own. She she still says she  has a trace of this mild chest discomfort, but it is less than 1/10 compared  to the previous it was an 8 or 9 out of 10. She, otherwise, has no  complaints. She does have a mild  headache now. She denies any visual  changes, dysphagia, any sharp chest pain, palpitations, shortness of breath,  wheeze, cough, abdominal pain, hematuria, dysuria, constipation, or  diarrhea. She denies any focal extremity weakness. Overall she does feel  fatigued.   PAST MEDICAL HISTORY:  1. Hiatal hernia.  2. Hypertension.  3. Hyperlipidemia.  4. Previous anxiety.   MEDICATIONS:  The patient is currently on Tenoretic which is a combination  ACE inhibitor and diuretic as well as Zocor. She was recently weaned off  SSRI.   ALLERGIES:  She has no known drug allergies.   SOCIAL HISTORY:  She denies any tobacco, alcohol, or drug abuse.   FAMILY HISTORY:  History of hypertension.   PHYSICAL EXAMINATION:  VITAL SIGNS: On admission temperature 99, heart rate  98, blood pressure 129/76, respirations 24. Since then her  heart rate has  come down to 77. O2 saturation is 94% on room air and 98% on two liters.  GENERAL: She is alert and oriented times three, in no apparent distress.  HEENT: Normocephalic and atraumatic. She has no carotid bruits.  HEART: Regular rate and rhythm. S1 and S2.  LUNGS: Clear to auscultation bilaterally.  ABDOMEN: Soft, nontender, nondistended. Positive bowel sounds.  EXTREMITIES: No clubbing or cyanosis. Her left leg is noted to be slightly  swollen, but she tells me she had a broken ankle with a chronic plate placed  in it.   LABORATORY WORK:  Sodium 139, potassium 2.7, chloride 106, bicarbonate 22,  BUN 14, creatinine 1.1, glucose 94. CPK=MB 49.9 and troponin-I less than  0.05.  The second set is 47.7  and less than 0.05. H&H is noted to be 11.9  and 35.   ASSESSMENT/PLAN:  1. Chest pain times one day classic description with her electrocardiogram     and first set of enzymes normal.  Will two more sets.  Stress test in the     morning.  2. Hyperlipidemia. She is on Zocor.  3. Hypertension. She is on Tenoretic.  4. History of hiatal hernia and her  first stress test is negative. She may     be worked up for gastroesophageal reflux disease.                                                Hollice Espy, M.D.    SKK/MEDQ  D:  09/22/2003  T:  09/22/2003  Job:  [id number]

## 2010-09-23 ENCOUNTER — Other Ambulatory Visit: Payer: Self-pay | Admitting: Gastroenterology

## 2010-11-18 LAB — CBC
HCT: 35.3 — ABNORMAL LOW
HCT: 39
Hemoglobin: 13.8
Platelets: 217
Platelets: 231
RBC: 3.73 — ABNORMAL LOW
RDW: 13.1
WBC: 7.9
WBC: 9.3

## 2010-11-18 LAB — DIFFERENTIAL
Eosinophils Relative: 1
Lymphocytes Relative: 29
Lymphs Abs: 2.7
Monocytes Absolute: 0.6
Monocytes Relative: 6
Neutro Abs: 6

## 2010-11-18 LAB — LIPID PANEL
Triglycerides: 111
VLDL: 22

## 2010-11-18 LAB — BASIC METABOLIC PANEL
BUN: 11
Creatinine, Ser: 0.77
GFR calc Af Amer: >60
GFR calc non Af Amer: >60
Potassium: 4.4

## 2010-11-18 LAB — COMPREHENSIVE METABOLIC PANEL
AST: 25
Albumin: 4.2
BUN: 13
Chloride: 108
Creatinine, Ser: 0.9
GFR calc Af Amer: >60
Total Protein: 7

## 2010-11-18 LAB — CK TOTAL AND CKMB (NOT AT ARMC)
CK, MB: 1.7
Relative Index: 1.5

## 2010-11-18 LAB — URINALYSIS, ROUTINE W REFLEX MICROSCOPIC
Hgb urine dipstick: NEGATIVE
Nitrite: NEGATIVE
Protein, ur: NEGATIVE
Specific Gravity, Urine: 1.005
Urobilinogen, UA: 1

## 2010-11-18 LAB — APTT: aPTT: 29

## 2010-11-18 LAB — ETHANOL: Alcohol, Ethyl (B): 18 — ABNORMAL HIGH

## 2010-11-18 LAB — HEMOGLOBIN A1C
Hgb A1c MFr Bld: 5.9
Mean Plasma Glucose: 133

## 2010-11-18 LAB — HOMOCYSTEINE: Homocysteine: 8

## 2010-11-18 LAB — TROPONIN I: Troponin I: 0.01

## 2011-04-11 ENCOUNTER — Emergency Department (HOSPITAL_COMMUNITY): Payer: 59

## 2011-04-11 ENCOUNTER — Encounter (HOSPITAL_COMMUNITY): Payer: Self-pay | Admitting: *Deleted

## 2011-04-11 ENCOUNTER — Other Ambulatory Visit: Payer: Self-pay

## 2011-04-11 ENCOUNTER — Emergency Department (HOSPITAL_COMMUNITY)
Admission: EM | Admit: 2011-04-11 | Discharge: 2011-04-11 | Disposition: A | Discharge status: Home / Self Care | Payer: 59 | Attending: Emergency Medicine | Admitting: Emergency Medicine

## 2011-04-11 DIAGNOSIS — I10 Essential (primary) hypertension: Secondary | ICD-10-CM | POA: Insufficient documentation

## 2011-04-11 DIAGNOSIS — R51 Headache: Secondary | ICD-10-CM | POA: Insufficient documentation

## 2011-04-11 DIAGNOSIS — Z8673 Personal history of transient ischemic attack (TIA), and cerebral infarction without residual deficits: Secondary | ICD-10-CM | POA: Insufficient documentation

## 2011-04-11 DIAGNOSIS — R112 Nausea with vomiting, unspecified: Secondary | ICD-10-CM | POA: Insufficient documentation

## 2011-04-11 DIAGNOSIS — H539 Unspecified visual disturbance: Secondary | ICD-10-CM | POA: Insufficient documentation

## 2011-04-11 DIAGNOSIS — G319 Degenerative disease of nervous system, unspecified: Secondary | ICD-10-CM | POA: Insufficient documentation

## 2011-04-11 LAB — CBC
HCT: 37.9 % (ref 36.0–46.0)
Hemoglobin: 13.2 g/dL (ref 12.0–15.0)
MCV: 90.7 fL (ref 78.0–100.0)
WBC: 8.5 10*3/uL (ref 4.0–10.5)

## 2011-04-11 LAB — BASIC METABOLIC PANEL
BUN: 13 mg/dL (ref 6–23)
Chloride: 102 mEq/L (ref 96–112)
Glucose, Bld: 98 mg/dL (ref 70–99)
Potassium: 3.7 mEq/L (ref 3.5–5.1)

## 2011-04-11 NOTE — ED Notes (Signed)
Patient transported to CT 

## 2011-04-11 NOTE — ED Provider Notes (Signed)
History     CSN: [id number]  Arrival date & time 04/11/11  1948   First MD Initiated Contact with Patient 04/11/11 2013      Chief Complaint  Patient presents with  . Headache    ? possible ?    (Consider location/radiation/quality/duration/timing/severity/associated sxs/prior treatment) Patient is a 66 y.o. female presenting with headaches. The history is provided by the patient.  Headache  The current episode started more than 1 week ago. The problem has been gradually worsening. The headache is associated with nothing. The pain is located in the occipital region. The quality of the pain is described as sharp. The pain is mild. Radiates to: left neck and left eye. Associated symptoms include nausea and vomiting. Pertinent negatives include no anorexia, no near-syncope and no shortness of breath. She has tried nothing for the symptoms. The treatment provided no relief.  Pt did notice some decrease in her vision on the left side as well.  Pt has been having these type of headaches off and on for years.  Pt also has some soreness in the back of her head.  Past Medical History  Diagnosis Date  . Unspecified gastritis and gastroduodenitis without mention of hemorrhage   . Allergic rhinitis, cause unspecified   . Pure hypercholesterolemia   . Unspecified essential hypertension   . Esophageal reflux   . Abdominal pain, epigastric   . Arthritis   . Esophageal stenosis   . Anxiety   . Hiatal hernia   . Chronic headaches     Past Surgical History  Procedure Date  . Appendectomy 1984  . Tubal ligation     76 reversal in 81 and tubal in 85  . Laparoscopic cholecystectomy 04/2004  . Laporoscopy 1977    ovarian cyst  . Tubaligation   . Lateral collateral ligament repair, knee   . Hand surgery   . Total ankle replacement     Family History  Problem Relation Age of Onset  . Stomach cancer Brother 44    question if started pancreatic   . Diabetes Sister   . Heart disease  Brother   . Colon cancer Neg Hx     History  Substance Use Topics  . Smoking status: Never Smoker   . Smokeless tobacco: Never Used  . Alcohol Use: 1.8 oz/week    3 Cans of beer per week     one or two per week     OB History    Grav Para Term Preterm Abortions TAB SAB Ect Mult Living                  Review of Systems  Respiratory: Negative for shortness of breath.   Cardiovascular: Negative for near-syncope.  Gastrointestinal: Positive for nausea and vomiting. Negative for anorexia.  Neurological: Positive for headaches.  All other systems reviewed and are negative.    Allergies  Review of patient's allergies indicates no known allergies.  Home Medications   Current Outpatient Rx  Name Route Sig Dispense Refill  . EZETIMIBE-SIMVASTATIN 10-10 MG PO TABS Oral Take 1 tablet by mouth at bedtime.    Marland Kitchen ALUM & MAG HYDROXIDE-SIMETH 200-200-20 MG/5ML PO SUSP Oral Take by mouth as needed.      Marland Kitchen AMLODIPINE-ATORVASTATIN 5-80 MG PO TABS Oral Take 1 tablet by mouth daily.      . CYMBALTA 60 MG PO CPEP Oral Take 1 capsule by mouth Daily.    Marland Kitchen ESOMEPRAZOLE MAGNESIUM 40 MG PO PACK Oral Take 40  mg by mouth 2 (two) times daily. 60 each 3  . LORATADINE 10 MG PO TABS Oral Take 10 mg by mouth daily.      . SUCRALFATE 1 GM/10ML PO SUSP  Take 10ml after meals and at bedtime 420 mL 1  . TRAZODONE HCL 50 MG PO TABS Oral Take 1 tablet by mouth At bedtime as needed.    . VENLAFAXINE HCL 75 MG PO TABS Oral Take 75 mg by mouth 3 (three) times daily.        BP 150/82  Pulse 89  Temp(Src) 98.3 F (36.8 C) (Oral)  Resp 19  SpO2 100%  Physical Exam  Nursing note and vitals reviewed. Constitutional: She is oriented to person, place, and time. She appears well-developed and well-nourished. No distress.  HENT:  Head: Normocephalic and atraumatic.  Right Ear: External ear normal.  Left Ear: External ear normal.  Mouth/Throat: Oropharynx is clear and moist.  Eyes: Conjunctivae are normal.  Right eye exhibits no discharge. Left eye exhibits no discharge. No scleral icterus.  Neck: Neck supple. No tracheal deviation present.  Cardiovascular: Normal rate, regular rhythm and intact distal pulses.   Pulmonary/Chest: Effort normal and breath sounds normal. No stridor. No respiratory distress. She has no wheezes. She has no rales.  Abdominal: Soft. Bowel sounds are normal. She exhibits no distension. There is no tenderness. There is no rebound and no guarding.  Musculoskeletal: She exhibits no edema and no tenderness.  Neurological: She is alert and oriented to person, place, and time. She has normal strength. No cranial nerve deficit ( no gross defecits noted) or sensory deficit. She exhibits normal muscle tone. She displays no seizure activity. Coordination normal.       No pronator drift bilateral upper extrem, able to hold both legs off bed for 5 seconds, sensation intact in all extremities, no visual field cuts, no left or right sided neglect  Skin: Skin is warm and dry. No rash noted.  Psychiatric: She has a normal mood and affect.    ED Course  Procedures (including critical care time)  Labs Reviewed  BASIC METABOLIC PANEL - Abnormal; Notable for the following:    GFR calc non Af Amer 62 (*)    GFR calc Af Amer 72 (*)    All other components within normal limits  CBC  SEDIMENTATION RATE   Ct Head Wo Contrast  04/11/2011  *RADIOLOGY REPORT*  Clinical Data: Right-sided headache.  Worse occipital region.  Left eye visual changes.  History of TIA.  CT HEAD WITHOUT CONTRAST  Technique:  Contiguous axial images were obtained from the base of the skull through the vertex without contrast.  Comparison: Brain MR 05/15/2007.  CT of 03/29.  Findings: Bone windows demonstrate clear paranasal sinuses and mastoid air cells.  Soft tissue windows demonstrate mildly age advanced cerebral atrophy.  This results in prominence of the extraaxial spaces, especially adjacent the frontal lobes.  This  is similar to on the prior exam. No  mass lesion, hemorrhage, hydrocephalus, acute infarct, intra-axial, or extra-axial fluid collection.  IMPRESSION:  1. No acute intracranial abnormality. 2.  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  Original Report Authenticated By: Consuello Bossier, M.D.      MDM  I suspect the patient could be having migraine type headaches. She does have a history of chronic headaches although states she's never been told that she has migraines. Her symptoms are not suggestive of subarachnoid hemorrhage. Her neck is supple and there is no fever and  I doubt meningitis. At this time she is feeling better and did not want any medications for her headache. CAT scan does not show any acute abnormality. She has no temporal artery tenderness and her sedimentation rate is normal. This point patient will be discharged home with reassurance        Celene Kras, MD 04/11/11 [phone]

## 2011-04-11 NOTE — ED Notes (Signed)
Pt has had a headache all day that concentrates in the occipital region.  Pt has noticed visual changes in her left eye.  Pupils are equal and reactive, Pt has equal strength in bilateral upper and lower extremities.  Pt is A&O x 4. Pt feels as if the left side of her face is "drooping" but this is not noted with visual inspection.  Pt has hx of TIA.

## 2013-07-08 ENCOUNTER — Other Ambulatory Visit: Payer: Self-pay

## 2013-12-21 ENCOUNTER — Emergency Department (HOSPITAL_COMMUNITY): Payer: Medicare Other

## 2013-12-21 ENCOUNTER — Encounter (HOSPITAL_COMMUNITY): Payer: Self-pay | Admitting: Emergency Medicine

## 2013-12-21 ENCOUNTER — Inpatient Hospital Stay (HOSPITAL_COMMUNITY)
Admission: EM | Admit: 2013-12-21 | Discharge: 2013-12-28 | DRG: 492 | Disposition: A | Discharge status: Home Health | Payer: Medicare Other | Attending: Orthopedic Surgery | Admitting: Orthopedic Surgery

## 2013-12-21 DIAGNOSIS — N390 Urinary tract infection, site not specified: Secondary | ICD-10-CM | POA: Diagnosis not present

## 2013-12-21 DIAGNOSIS — J9601 Acute respiratory failure with hypoxia: Secondary | ICD-10-CM | POA: Diagnosis not present

## 2013-12-21 DIAGNOSIS — J189 Pneumonia, unspecified organism: Secondary | ICD-10-CM | POA: Diagnosis not present

## 2013-12-21 DIAGNOSIS — Y95 Nosocomial condition: Secondary | ICD-10-CM | POA: Diagnosis not present

## 2013-12-21 DIAGNOSIS — M1711 Unilateral primary osteoarthritis, right knee: Secondary | ICD-10-CM | POA: Diagnosis present

## 2013-12-21 DIAGNOSIS — E780 Pure hypercholesterolemia: Secondary | ICD-10-CM | POA: Diagnosis present

## 2013-12-21 DIAGNOSIS — K449 Diaphragmatic hernia without obstruction or gangrene: Secondary | ICD-10-CM | POA: Diagnosis present

## 2013-12-21 DIAGNOSIS — Y92015 Private garage of single-family (private) house as the place of occurrence of the external cause: Secondary | ICD-10-CM | POA: Diagnosis not present

## 2013-12-21 DIAGNOSIS — Z419 Encounter for procedure for purposes other than remedying health state, unspecified: Secondary | ICD-10-CM

## 2013-12-21 DIAGNOSIS — Z6831 Body mass index (BMI) 31.0-31.9, adult: Secondary | ICD-10-CM

## 2013-12-21 DIAGNOSIS — W010XXA Fall on same level from slipping, tripping and stumbling without subsequent striking against object, initial encounter: Secondary | ICD-10-CM | POA: Diagnosis present

## 2013-12-21 DIAGNOSIS — S82143A Displaced bicondylar fracture of unspecified tibia, initial encounter for closed fracture: Secondary | ICD-10-CM | POA: Diagnosis present

## 2013-12-21 DIAGNOSIS — E785 Hyperlipidemia, unspecified: Secondary | ICD-10-CM | POA: Diagnosis present

## 2013-12-21 DIAGNOSIS — B961 Klebsiella pneumoniae [K. pneumoniae] as the cause of diseases classified elsewhere: Secondary | ICD-10-CM | POA: Diagnosis not present

## 2013-12-21 DIAGNOSIS — K219 Gastro-esophageal reflux disease without esophagitis: Secondary | ICD-10-CM | POA: Diagnosis present

## 2013-12-21 DIAGNOSIS — R7981 Abnormal blood-gas level: Secondary | ICD-10-CM

## 2013-12-21 DIAGNOSIS — S82141A Displaced bicondylar fracture of right tibia, initial encounter for closed fracture: Secondary | ICD-10-CM | POA: Diagnosis present

## 2013-12-21 DIAGNOSIS — B373 Candidiasis of vulva and vagina: Secondary | ICD-10-CM | POA: Diagnosis not present

## 2013-12-21 DIAGNOSIS — I10 Essential (primary) hypertension: Secondary | ICD-10-CM | POA: Diagnosis present

## 2013-12-21 DIAGNOSIS — Z7982 Long term (current) use of aspirin: Secondary | ICD-10-CM

## 2013-12-21 DIAGNOSIS — S82141F Displaced bicondylar fracture of right tibia, subsequent encounter for open fracture type IIIA, IIIB, or IIIC with routine healing: Secondary | ICD-10-CM

## 2013-12-21 DIAGNOSIS — R509 Fever, unspecified: Secondary | ICD-10-CM

## 2013-12-21 DIAGNOSIS — B9689 Other specified bacterial agents as the cause of diseases classified elsewhere: Secondary | ICD-10-CM | POA: Diagnosis present

## 2013-12-21 DIAGNOSIS — M25561 Pain in right knee: Secondary | ICD-10-CM | POA: Diagnosis present

## 2013-12-21 LAB — CBC
HCT: 40.1 % (ref 36.0–46.0)
Hemoglobin: 13.9 g/dL (ref 12.0–15.0)
MCH: 31 pg (ref 26.0–34.0)
MCHC: 34.7 g/dL (ref 30.0–36.0)
MCV: 89.5 fL (ref 78.0–100.0)
PLATELETS: 315 10*3/uL (ref 150–400)
RBC: 4.48 MIL/uL (ref 3.87–5.11)
RDW: 12.8 % (ref 11.5–15.5)
WBC: 11.7 10*3/uL — ABNORMAL HIGH (ref 4.0–10.5)

## 2013-12-21 LAB — COMPREHENSIVE METABOLIC PANEL
ALK PHOS: 118 U/L — AB (ref 39–117)
ALT: 35 U/L (ref 0–35)
AST: 51 U/L — ABNORMAL HIGH (ref 0–37)
Albumin: 4.7 g/dL (ref 3.5–5.2)
Anion gap: 19 — ABNORMAL HIGH (ref 5–15)
BILIRUBIN TOTAL: 0.7 mg/dL (ref 0.3–1.2)
BUN: 17 mg/dL (ref 6–23)
CALCIUM: 9.9 mg/dL (ref 8.4–10.5)
CHLORIDE: 98 meq/L (ref 96–112)
CO2: 21 meq/L (ref 19–32)
Creatinine, Ser: 0.83 mg/dL (ref 0.50–1.10)
GFR, EST AFRICAN AMERICAN: 83 mL/min — AB (ref >90)
GFR, EST NON AFRICAN AMERICAN: 71 mL/min — AB (ref >90)
GLUCOSE: 100 mg/dL — AB (ref 70–99)
Potassium: 3.9 mEq/L (ref 3.7–5.3)
SODIUM: 138 meq/L (ref 137–147)
Total Protein: 7.9 g/dL (ref 6.0–8.3)

## 2013-12-21 LAB — PROTIME-INR
INR: 1.06 (ref 0.00–1.49)
Prothrombin Time: 13.9 seconds (ref 11.6–15.2)

## 2013-12-21 LAB — APTT: aPTT: 69 seconds — ABNORMAL HIGH (ref 24–37)

## 2013-12-21 MED ORDER — PANTOPRAZOLE SODIUM 40 MG PO TBEC
40.0000 mg | DELAYED_RELEASE_TABLET | Freq: Every day | ORAL | Status: DC
  Administered 2013-12-22: 40 mg via ORAL
  Filled 2013-12-21 (×2): qty 1

## 2013-12-21 MED ORDER — LORATADINE 10 MG PO TABS
10.0000 mg | ORAL_TABLET | Freq: Every day | ORAL | Status: DC
  Administered 2013-12-22 – 2013-12-28 (×7): 10 mg via ORAL
  Filled 2013-12-21 (×7): qty 1

## 2013-12-21 MED ORDER — EZETIMIBE-SIMVASTATIN 10-10 MG PO TABS: 1.0000 | ORAL_TABLET | Freq: Every day | ORAL | Status: DC

## 2013-12-21 MED ORDER — DULOXETINE HCL 30 MG PO CPEP
30.0000 mg | ORAL_CAPSULE | Freq: Every day | ORAL | Status: DC
  Administered 2013-12-22 – 2013-12-26 (×5): 30 mg via ORAL
  Filled 2013-12-21 (×5): qty 1

## 2013-12-21 MED ORDER — ALPRAZOLAM 0.25 MG PO TABS
0.2500 mg | ORAL_TABLET | Freq: Every evening | ORAL | Status: DC | PRN
  Administered 2013-12-22 – 2013-12-27 (×4): 0.25 mg via ORAL
  Filled 2013-12-21 (×4): qty 1

## 2013-12-21 MED ORDER — ONDANSETRON HCL 4 MG/2ML IJ SOLN: 4.0000 mg | Freq: Four times a day (QID) | INTRAMUSCULAR | Status: DC | PRN

## 2013-12-21 MED ORDER — OXYCODONE-ACETAMINOPHEN 5-325 MG PO TABS
2.0000 | ORAL_TABLET | Freq: Once | ORAL | Status: AC
  Administered 2013-12-21: 2 via ORAL
  Filled 2013-12-21: qty 2

## 2013-12-21 MED ORDER — MORPHINE SULFATE 2 MG/ML IJ SOLN
2.0000 mg | INTRAMUSCULAR | Status: DC | PRN
  Administered 2013-12-21 – 2013-12-28 (×12): 2 mg via INTRAVENOUS
  Filled 2013-12-21 (×12): qty 1

## 2013-12-21 MED ORDER — DULOXETINE HCL 60 MG PO CPEP
60.0000 mg | ORAL_CAPSULE | Freq: Every day | ORAL | Status: DC
  Filled 2013-12-21: qty 1

## 2013-12-21 MED ORDER — SODIUM CHLORIDE 0.45 % IV SOLN
INTRAVENOUS | Status: DC
  Administered 2013-12-21 – 2013-12-22 (×2): via INTRAVENOUS

## 2013-12-21 MED ORDER — ONDANSETRON HCL 4 MG PO TABS: 4.0000 mg | ORAL_TABLET | Freq: Four times a day (QID) | ORAL | Status: DC | PRN

## 2013-12-21 MED ORDER — ONDANSETRON 4 MG PO TBDP
4.0000 mg | ORAL_TABLET | Freq: Once | ORAL | Status: AC
  Administered 2013-12-21: 4 mg via ORAL
  Filled 2013-12-21: qty 1

## 2013-12-21 MED ORDER — EZETIMIBE 10 MG PO TABS
10.0000 mg | ORAL_TABLET | Freq: Every day | ORAL | Status: DC
  Administered 2013-12-21 – 2013-12-26 (×6): 10 mg via ORAL
  Filled 2013-12-21 (×8): qty 1

## 2013-12-21 MED ORDER — OXYCODONE HCL 5 MG PO TABS
5.0000 mg | ORAL_TABLET | ORAL | Status: DC | PRN
  Administered 2013-12-21 (×2): 5 mg via ORAL
  Filled 2013-12-21 (×4): qty 1

## 2013-12-21 MED ORDER — TRAZODONE HCL 50 MG PO TABS
50.0000 mg | ORAL_TABLET | Freq: Every day | ORAL | Status: DC
  Administered 2013-12-21 – 2013-12-25 (×5): 50 mg via ORAL
  Filled 2013-12-21 (×9): qty 1

## 2013-12-21 MED ORDER — SIMVASTATIN 10 MG PO TABS
10.0000 mg | ORAL_TABLET | Freq: Every day | ORAL | Status: DC
  Administered 2013-12-22 – 2013-12-27 (×6): 10 mg via ORAL
  Filled 2013-12-21 (×8): qty 1

## 2013-12-21 NOTE — ED Provider Notes (Signed)
CSN: [id number]     Arrival date & time 12/21/13  1257 History  This chart was scribed for non-physician practitioner, Margarita Mail, PA-C,working with Delice Bison Ward, DO, by Marlowe Kays, ED Scribe. This patient was seen in room WTR7/WTR7 and the patient's care was started at 2:42 PM.  Chief Complaint  Patient presents with  . Knee Pain  . Fall   Patient is a 68 y.o. female presenting with knee pain and fall. The history is provided by the patient. No language interpreter was used.  Knee Pain Fall   HPI Comments:  [name] is a 68 y.o. female with PMH of gastritis, hypercholesterolemia, GERD and chronic HA who presents to the Emergency Department complaining of moderate right knee swelling and pain that occurred earlier this morning. Pt reports she is scheduled for knee replacement of this knee in one month with Dr. Wynelle Link. She states she walking this morning and the knee locked up causing her to fall. She reports hearing a popping noise and states the knee went back underneath her. She states she took Ibuprofen right before the fall. She denies LOC, head injury, nausea, vomiting, numbness, tingling or weakness of the right lower extremities.   Past Medical History  Diagnosis Date  . Unspecified gastritis and gastroduodenitis without mention of hemorrhage   . Allergic rhinitis, cause unspecified   . Pure hypercholesterolemia   . Unspecified essential hypertension   . Esophageal reflux   . Abdominal pain, epigastric   . Arthritis   . Esophageal stenosis   . Anxiety   . Hiatal hernia   . Chronic headaches    Past Surgical History  Procedure Laterality Date  . Appendectomy  1984  . Tubal ligation      76 reversal in 81 and tubal in 85  . Laparoscopic cholecystectomy  04/2004  . Laporoscopy  1977    ovarian cyst  . Tubaligation    . Lateral collateral ligament repair, knee    . Hand surgery    . Total ankle replacement     Family History  Problem Relation Age  of Onset  . Stomach cancer Brother 76    question if started pancreatic   . Diabetes Sister   . Heart disease Brother   . Colon cancer Neg Hx    History  Substance Use Topics  . Smoking status: Never Smoker   . Smokeless tobacco: Never Used  . Alcohol Use: 1.8 oz/week    3 Cans of beer per week     Comment: one or two per week    OB History   Grav Para Term Preterm Abortions TAB SAB Ect Mult Living                 Review of Systems  Gastrointestinal: Negative for nausea and vomiting.  Musculoskeletal: Positive for arthralgias and joint swelling.  Skin: Negative for color change and wound.  Neurological: Negative for syncope, weakness and numbness.    Allergies  Review of patient's allergies indicates no known allergies.  Home Medications   Prior to Admission medications   Medication Sig Start Date End Date Taking? Authorizing Provider  ALPRAZolam (XANAX) 0.25 MG tablet Take 0.25 mg by mouth at bedtime as needed.    Historical Provider, MD  aspirin 325 MG tablet Take 325 mg by mouth daily.    Historical Provider, MD  CYMBALTA 60 MG capsule Take 1 capsule by mouth Daily.    Historical Provider, MD  esomeprazole (NEXIUM) 40 MG  packet Take 40 mg by mouth daily before breakfast. 05/16/10   Sable Feil, MD  ezetimibe-simvastatin (VYTORIN) 10-10 MG per tablet Take 1 tablet by mouth at bedtime.    Historical Provider, MD  loratadine (CLARITIN) 10 MG tablet Take 10 mg by mouth daily.      Historical Provider, MD  traZODone (DESYREL) 50 MG tablet Take 1 tablet by mouth At bedtime as needed. FOR SLEEP    Historical Provider, MD   Triage Vitals: BP 138/61  Pulse 95  Temp(Src) 98.1 F (36.7 C) (Oral)  Resp 16  SpO2 96% Physical Exam  Nursing note and vitals reviewed. Constitutional: She is oriented to person, place, and time. She appears well-developed and well-nourished.  HENT:  Head: Normocephalic and atraumatic.  Eyes: EOM are normal.  Neck: Normal range of motion.   Cardiovascular: Normal rate.   Pulmonary/Chest: Effort normal.  Musculoskeletal: Normal range of motion. She exhibits edema and tenderness.  Moderate swelling to right anterior knee. Tenderness to palpation along joint line. ROM severely limited due to pain. Swelling extends down to calf.  Neurological: She is alert and oriented to person, place, and time.  Skin: Skin is warm and dry.  Psychiatric: She has a normal mood and affect. Her behavior is normal.    ED Course  Procedures (including critical care time) DIAGNOSTIC STUDIES: Oxygen Saturation is 96% on RA, adequate by my interpretation.   COORDINATION OF CARE: 2:50 PM- Will contact on call physician for Dr. Wynelle Link and order pain medication. Pt verbalizes understanding and agrees to plan.  Medications - No data to display  Labs Review Labs Reviewed - No data to display  Imaging Review Dg Knee Complete 4 Views Right  12/21/2013   CLINICAL DATA:  Fall. Right knee injury and pain. Initial encounter.  EXAM: RIGHT KNEE - COMPLETE 4+ VIEW  COMPARISON:  None.  FINDINGS: Small to moderate knee joint effusion is seen. Non standard positioning limits evaluation, however there is a mildly depressed fracture involving the lateral tibial plateau and tibial spines. No evidence of dislocation. Generalized osteopenia noted.  IMPRESSION: Mildly depressed fracture involving the lateral tibial plateau and tibial spines.  Small to moderate knee joint effusion.  Osteopenia.   Electronically Signed   By: Earle Gell M.D.   On: 12/21/2013 14:30     EKG Interpretation None      MDM   Final diagnoses:  Tibial plateau fracture    Patient with tibial plateau fracture. I have spoken with doctor on call for [facility]. patietn will be admitted for repair. Patient informed degrees with plan of care. She is stable throughout her course at the emergency department. Screening labs ordered. I personally performed the services described in this  documentation, which was scribed in my presence. The recorded information has been reviewed and is accurate.    Margarita Mail, PA-C 12/27/13 1730

## 2013-12-21 NOTE — Progress Notes (Deleted)
Pt received from ED. Report received via phone approx 5 min previous. Pt placed in bed & vital signs obtained. Kymberly Blomberg, [employer]

## 2013-12-21 NOTE — H&P (Signed)
Agree with above Closed injury NVI Compartments soft/NT Case and images reviewed with Dr Maureen Ralphs Admit for pain control Plan for surgery (ORIF) Friday with Dr Maureen Ralphs NPO after midnight Thursday

## 2013-12-21 NOTE — Progress Notes (Signed)
Pt arrived from ED to 1614. Report received via phone approx 5 min ago. Pt placed in bed, vital signs obtained. Edvin Albus, [employer]

## 2013-12-21 NOTE — ED Notes (Signed)
Pt states that she is scheduled to have knee replacement surgery.  States that her rt knee locked up and she fell onto that knee.  C/o pain and swelling.

## 2013-12-21 NOTE — H&P (Signed)
[name] is an 68 y.o. female.   Chief Complaint: right knee pain HPI:  Patient states that she tripped earlier today landing on right knee.  Unable to weightbear after. Denies other injuries or LOC.  Known hx of right knee djd and is followed by Dr Wynelle Link for this and is sched for right TKR 30 Nov.  xrays and CT scan showed a bicondylar tibial plateau fracture.   Past Medical History  Diagnosis Date  . Unspecified gastritis and gastroduodenitis without mention of hemorrhage   . Allergic rhinitis, cause unspecified   . Pure hypercholesterolemia   . Unspecified essential hypertension   . Esophageal reflux   . Abdominal pain, epigastric   . Arthritis   . Esophageal stenosis   . Anxiety   . Hiatal hernia   . Chronic headaches     Past Surgical History  Procedure Laterality Date  . Appendectomy  1984  . Tubal ligation      76 reversal in 81 and tubal in 85  . Laparoscopic cholecystectomy  04/2004  . Laporoscopy  1977    ovarian cyst  . Tubaligation    . Lateral collateral ligament repair, knee    . Hand surgery    . Total ankle replacement      Family History  Problem Relation Age of Onset  . Stomach cancer Brother 37    question if started pancreatic   . Diabetes Sister   . Heart disease Brother   . Colon cancer Neg Hx    Social History:  reports that she has never smoked. She has never used smokeless tobacco. She reports that she drinks about 1.8 ounces of alcohol per week. She reports that she does not use illicit drugs.  Allergies: No Known Allergies   (Not in a hospital admission)  No results found for this or any previous visit (from the past 48 hour(s)). Ct Knee Right Wo Contrast  12/21/2013   CLINICAL DATA:  Tibial plateau fracture. Initial encounter. RIGHT knee swelling.  EXAM: CT OF THE RIGHT knee KNEE WITHOUT CONTRAST  TECHNIQUE: Multidetector CT imaging of the RIGHT knee knee was performed according to the standard protocol. Multiplanar CT image  reconstructions were also generated.  COMPARISON:  12/21/2013 radiographs.  FINDINGS: Lipohemarthrosis is present. There is an lateral tibial plateau fracture with central depression measuring 8 mm. Fracture extends into the proximal tibiofibular joint. The distal femur appears intact. 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 Proximal fibula appears intact. Metaphysis intact.  Medial tibial plateau fracture is also present, only well seen on the sagittal images. The fracture of the medial tibial plateau is nondepressed (image 30 series 7). There is no cortical step-off.  IMPRESSION: Bicondylar tibial plateau fracture with depression of the central lateral tibial plateau and no displacement or depression of the medial tibial plateau fracture. This is compatible with Schatzker V fracture.   Electronically Signed   By: Dereck Ligas M.D.   On: 12/21/2013 16:00   Dg Knee Complete 4 Views Right  12/21/2013   CLINICAL DATA:  Fall. Right knee injury and pain. Initial encounter.  EXAM: RIGHT KNEE - COMPLETE 4+ VIEW  COMPARISON:  None.  FINDINGS: Small to moderate knee joint effusion is seen. Non standard positioning limits evaluation, however there is a mildly depressed fracture involving the lateral tibial plateau and tibial spines. No evidence of dislocation. Generalized osteopenia noted.  IMPRESSION: Mildly depressed fracture involving the lateral tibial plateau and tibial spines.  Small to moderate knee  joint effusion.  Osteopenia.   Electronically Signed   By: Earle Gell M.D.   On: 12/21/2013 14:30    Review of Systems  Constitutional: Negative.   HENT: Negative.   Respiratory: Negative.   Cardiovascular: Negative.   Gastrointestinal: Negative.   Musculoskeletal: Positive for joint pain. Negative for back pain and neck pain.  Neurological: Negative.   Psychiatric/Behavioral: Negative.     Blood pressure 138/61, pulse 95, temperature 98.1 F (36.7 C),  temperature source Oral, resp. rate 16, SpO2 96.00%. Physical Exam  Constitutional: She is oriented to person, place, and time. She appears well-developed and well-nourished.  HENT:  Head: Normocephalic and atraumatic.  Eyes: EOM are normal. Pupils are equal, round, and reactive to light.  Neck: Normal range of motion.  Respiratory: Effort normal. No respiratory distress.  Musculoskeletal: She exhibits tenderness (right knee.  swelling.  compartments soft and nontender.  neurologically intact.  ).  Neurological: She is alert and oriented to person, place, and time.  Skin: Skin is warm and dry.  Psychiatric: She has a normal mood and affect. Her behavior is normal.     Assessment/Plan Right bicondylar tibial plateau fracture.  Will admit patient.  Will likely need ORIF procedure.  My attending Dr Melina Schools has reviewed xrays/CT and will discuss with Dr Wynelle Link.  NPO after midnight.  Knee immobilizer. Strict nwb.  All questions answered.    Deaundra Dupriest M 12/21/2013, 4:54 PM

## 2013-12-21 NOTE — ED Notes (Signed)
Report given to [employer], Therapist, sports at [employer] [employer] [employer]

## 2013-12-22 ENCOUNTER — Encounter (HOSPITAL_COMMUNITY): Payer: Self-pay | Admitting: *Deleted

## 2013-12-22 LAB — BASIC METABOLIC PANEL
Anion gap: 14 (ref 5–15)
BUN: 11 mg/dL (ref 6–23)
CO2: 20 mEq/L (ref 19–32)
CREATININE: 0.74 mg/dL (ref 0.50–1.10)
Calcium: 9.1 mg/dL (ref 8.4–10.5)
Chloride: 101 mEq/L (ref 96–112)
GFR calc non Af Amer: 86 mL/min — ABNORMAL LOW (ref >90)
GLUCOSE: 132 mg/dL — AB (ref 70–99)
Potassium: 4.4 mEq/L (ref 3.7–5.3)
Sodium: 135 mEq/L — ABNORMAL LOW (ref 137–147)

## 2013-12-22 LAB — URINALYSIS, ROUTINE W REFLEX MICROSCOPIC
BILIRUBIN URINE: NEGATIVE
GLUCOSE, UA: NEGATIVE mg/dL
HGB URINE DIPSTICK: NEGATIVE
KETONES UR: NEGATIVE mg/dL
Leukocytes, UA: NEGATIVE
Nitrite: NEGATIVE
PROTEIN: NEGATIVE mg/dL
Specific Gravity, Urine: 1.015 (ref 1.005–1.030)
Urobilinogen, UA: 0.2 mg/dL (ref 0.0–1.0)
pH: 5.5 (ref 5.0–8.0)

## 2013-12-22 LAB — CBC
HEMATOCRIT: 36.3 % (ref 36.0–46.0)
HEMOGLOBIN: 12.3 g/dL (ref 12.0–15.0)
MCH: 30.8 pg (ref 26.0–34.0)
MCHC: 33.9 g/dL (ref 30.0–36.0)
MCV: 90.8 fL (ref 78.0–100.0)
Platelets: 313 10*3/uL (ref 150–400)
RBC: 4 MIL/uL (ref 3.87–5.11)
RDW: 12.9 % (ref 11.5–15.5)
WBC: 8.2 10*3/uL (ref 4.0–10.5)

## 2013-12-22 LAB — SURGICAL PCR SCREEN
MRSA, PCR: NEGATIVE
Staphylococcus aureus: NEGATIVE

## 2013-12-22 MED ORDER — OXYCODONE HCL 5 MG PO TABS
5.0000 mg | ORAL_TABLET | ORAL | Status: DC | PRN
  Administered 2013-12-22 – 2013-12-26 (×17): 10 mg via ORAL
  Filled 2013-12-22 (×17): qty 2

## 2013-12-22 MED ORDER — ACETAMINOPHEN 325 MG PO TABS
650.0000 mg | ORAL_TABLET | ORAL | Status: DC | PRN
Start: 2013-12-22 — End: 2013-12-26
  Administered 2013-12-22 – 2013-12-25 (×3): 650 mg via ORAL
  Filled 2013-12-22 (×2): qty 2

## 2013-12-22 MED ORDER — NON FORMULARY: 40.0000 mg | Status: DC

## 2013-12-22 MED ORDER — ESOMEPRAZOLE MAGNESIUM 40 MG PO CPDR
40.0000 mg | DELAYED_RELEASE_CAPSULE | Freq: Every day | ORAL | Status: DC
  Administered 2013-12-23 – 2013-12-28 (×6): 40 mg via ORAL
  Filled 2013-12-22 (×6): qty 1

## 2013-12-22 MED ORDER — SODIUM CHLORIDE 0.9 % IV SOLN
INTRAVENOUS | Status: DC
  Administered 2013-12-22 – 2013-12-23 (×4): via INTRAVENOUS

## 2013-12-22 MED ORDER — METHOCARBAMOL 500 MG PO TABS
500.0000 mg | ORAL_TABLET | Freq: Four times a day (QID) | ORAL | Status: DC | PRN
  Administered 2013-12-22 – 2013-12-28 (×11): 500 mg via ORAL
  Filled 2013-12-22 (×12): qty 1

## 2013-12-22 MED ORDER — SODIUM CHLORIDE 0.9 % IV SOLN
INTRAVENOUS | Status: DC
  Administered 2013-12-23: via INTRAVENOUS

## 2013-12-22 MED ORDER — METHOCARBAMOL 1000 MG/10ML IJ SOLN
500.0000 mg | Freq: Four times a day (QID) | INTRAVENOUS | Status: DC | PRN
  Administered 2013-12-23 – 2013-12-26 (×2): 500 mg via INTRAVENOUS
  Filled 2013-12-22 (×4): qty 5

## 2013-12-22 MED ORDER — CEFAZOLIN SODIUM-DEXTROSE 2-3 GM-% IV SOLR
2.0000 g | Freq: Three times a day (TID) | INTRAVENOUS | Status: DC
  Filled 2013-12-22 (×5): qty 50

## 2013-12-22 MED ORDER — ACETAMINOPHEN 325 MG PO TABS
ORAL_TABLET | ORAL | Status: AC
  Filled 2013-12-22: qty 2

## 2013-12-22 MED ORDER — ENOXAPARIN SODIUM 40 MG/0.4ML ~~LOC~~ SOLN
40.0000 mg | SUBCUTANEOUS | Status: AC
  Administered 2013-12-22: 40 mg via SUBCUTANEOUS
  Filled 2013-12-22: qty 0.4

## 2013-12-22 NOTE — Progress Notes (Signed)
  Patient reports pain as mild and moderate.   Patient seen in rounds with Dr. Wynelle Link. Patient is currently a bedrest due to the fracture. They will be NWB to the right. Plan is to go Home after hospital stay.  She was seen in rounds by Dr. Wynelle Link this morning.  She was admitted by Dr. Rolena Infante yesterday following a fall in her garage at home.  She had pain and brought to [facility] where x-rays revealed a displaced right tibial plateau fracture.  She was placed in a knee immobilizer and placed at bedrest.  Will obtain a consent or ORIF right tibia.  Allow her to eat today and then NPO after MN tonight.  PRN meds ordered.  Foley to gravity.  IV ANCEF on call to OR tomorrow. Increase fluids at MN when becomes NPO.  Vital signs in last 24 hours: Temp:  [97.8 F (36.6 C)-98.4 F (36.9 C)] 98.3 F (36.8 C) (10/29 0636) Pulse Rate:  [83-100] 100 (10/29 0636) Resp:  [16-20] 18 (10/29 0636) BP: (123-146)/(46-63) 123/46 mmHg (10/29 0636) SpO2:  [92 %-96 %] 92 % (10/29 0636) Weight:  [84.369 kg (186 lb)] 84.369 kg (186 lb) (10/29 0730)  I&O's: I/O last 3 completed shifts: In: 935 [I.V.:935] Out: 1200 [Urine:1200]    Labs:  Recent Labs  12/21/13 1928 12/22/13 0425  HGB 13.9 12.3    Recent Labs  12/21/13 1928 12/22/13 0425  WBC 11.7* 8.2  RBC 4.48 4.00  HCT 40.1 36.3  PLT 315 313    Recent Labs  12/21/13 1928 12/22/13 0425  NA 138 135*  K 3.9 4.4  CL 98 101  CO2 21 20  BUN 17 11  CREATININE 0.83 0.74  GLUCOSE 100* 132*  CALCIUM 9.9 9.1    Recent Labs  12/21/13 1928  INR 1.06     EXAM: General - Patient is Alert, Appropriate and Oriented Extremity - Neurovascular intact Sensation intact distally Knee Immobilizer in place Motor Function - intact, moving foot and toes well on exam.    Past Medical History  Diagnosis Date  . Unspecified gastritis and gastroduodenitis without mention of hemorrhage   . Allergic rhinitis, cause unspecified   . Pure  hypercholesterolemia   . Unspecified essential hypertension   . Esophageal reflux   . Abdominal pain, epigastric   . Arthritis   . Esophageal stenosis   . Anxiety   . Hiatal hernia   . Chronic headaches     Assessment/Plan: Active Problems: Right closed, displace Tibial plateau fracture  Estimated body mass index is 31.91 kg/(m^2) as calculated from the following:   Height as of this encounter: 5\' 4"  (1.626 m).   Weight as of this encounter: 84.369 kg (186 lb).  Plan - Surgery tomorrow as per Dr. Wynelle Link  DVT Prophylaxis - Lovenox dose this morning.  No Lovenox tomorrow.  Will place on further DVT prophylaxis treatment postop tomorrow. NWB right leg PRN meds Foley to gravity  Arlee Muslim, PA-C Orthopaedic Surgery 12/22/2013, 9:07 AM

## 2013-12-22 NOTE — Progress Notes (Signed)
Placed #14 [place] Foley Catheter as pt unable to urinate in Bedpan and having increased pain when moving in bed with assistance. Pt tolerated procedure well with immediate relief of pressure in pelvic region and immediate flow of clear yellow urine into drainage bag.

## 2013-12-22 NOTE — Progress Notes (Signed)
12/22/13 2120 Nursing Dr Tonita Cong called reg temp of  101.1 at this time. Tylenol po ordered. Will continue to monitor patient.

## 2013-12-22 NOTE — Progress Notes (Signed)
Utilization review completed.  

## 2013-12-23 ENCOUNTER — Other Ambulatory Visit: Payer: 59

## 2013-12-23 ENCOUNTER — Inpatient Hospital Stay (HOSPITAL_COMMUNITY): Payer: Medicare Other

## 2013-12-23 ENCOUNTER — Other Ambulatory Visit: Payer: Self-pay

## 2013-12-23 DIAGNOSIS — R509 Fever, unspecified: Secondary | ICD-10-CM

## 2013-12-23 DIAGNOSIS — J189 Pneumonia, unspecified organism: Secondary | ICD-10-CM | POA: Diagnosis not present

## 2013-12-23 DIAGNOSIS — Y95 Nosocomial condition: Secondary | ICD-10-CM | POA: Diagnosis not present

## 2013-12-23 LAB — BASIC METABOLIC PANEL
ANION GAP: 11 (ref 5–15)
BUN: 6 mg/dL (ref 6–23)
CALCIUM: 8.7 mg/dL (ref 8.4–10.5)
CHLORIDE: 102 meq/L (ref 96–112)
CO2: 27 meq/L (ref 19–32)
Creatinine, Ser: 0.71 mg/dL (ref 0.50–1.10)
GFR calc Af Amer: >90 mL/min (ref >90)
GFR calc non Af Amer: 87 mL/min — ABNORMAL LOW (ref >90)
GLUCOSE: 144 mg/dL — AB (ref 70–99)
Potassium: 3.8 mEq/L (ref 3.7–5.3)
SODIUM: 140 meq/L (ref 137–147)

## 2013-12-23 LAB — CBC
HCT: 33.6 % — ABNORMAL LOW (ref 36.0–46.0)
Hemoglobin: 11.1 g/dL — ABNORMAL LOW (ref 12.0–15.0)
MCH: 30.7 pg (ref 26.0–34.0)
MCHC: 33 g/dL (ref 30.0–36.0)
MCV: 92.8 fL (ref 78.0–100.0)
PLATELETS: 239 10*3/uL (ref 150–400)
RBC: 3.62 MIL/uL — AB (ref 3.87–5.11)
RDW: 12.8 % (ref 11.5–15.5)
WBC: 10.2 10*3/uL (ref 4.0–10.5)

## 2013-12-23 LAB — GLUCOSE, CAPILLARY: GLUCOSE-CAPILLARY: 115 mg/dL — AB (ref 70–99)

## 2013-12-23 MED ORDER — CEFAZOLIN SODIUM-DEXTROSE 2-3 GM-% IV SOLR
2.0000 g | Freq: Once | INTRAVENOUS | Status: AC
  Administered 2013-12-24: 2 g via INTRAVENOUS
  Filled 2013-12-23: qty 50

## 2013-12-23 MED ORDER — LEVOFLOXACIN IN D5W 750 MG/150ML IV SOLN
750.0000 mg | Freq: Every day | INTRAVENOUS | Status: DC
  Administered 2013-12-23 – 2013-12-27 (×5): 750 mg via INTRAVENOUS
  Filled 2013-12-23 (×5): qty 150

## 2013-12-23 MED ORDER — ENOXAPARIN SODIUM 40 MG/0.4ML ~~LOC~~ SOLN
40.0000 mg | Freq: Every day | SUBCUTANEOUS | Status: AC
  Administered 2013-12-23: 40 mg via SUBCUTANEOUS
  Filled 2013-12-23: qty 0.4

## 2013-12-23 MED ORDER — ENOXAPARIN SODIUM 40 MG/0.4ML ~~LOC~~ SOLN
40.0000 mg | SUBCUTANEOUS | Status: DC
Start: 2013-12-24 — End: 2013-12-26
  Administered 2013-12-24 – 2013-12-25 (×2): 40 mg via SUBCUTANEOUS
  Filled 2013-12-23 (×3): qty 0.4

## 2013-12-23 MED ORDER — SODIUM CHLORIDE 0.9 % IV SOLN
INTRAVENOUS | Status: DC
Start: 2013-12-24 — End: 2013-12-26
  Administered 2013-12-24: 16:00:00 via INTRAVENOUS

## 2013-12-23 MED ORDER — ALBUTEROL SULFATE (2.5 MG/3ML) 0.083% IN NEBU: 2.5000 mg | INHALATION_SOLUTION | Freq: Four times a day (QID) | RESPIRATORY_TRACT | Status: DC | PRN

## 2013-12-23 NOTE — Consult Note (Signed)
[facility] Medical Consultation  FENIX RUPPE NTI:[id number] DOB: [birth year] DOA: 12/21/2013 PCP: PROVIDER NOT IN SYSTEM   Requesting physician: Dr Maureen Ralphs Date of consultation: 12-23-2013 Reason for consultation: Hypoxemia, PNA.   Impression/Recommendations Active Problems:   Tibial plateau fracture    1-Acute Hypoxic Respiratory failure.  Patient stable. Oxygen sat high 90 % on 2 L. No work of breathing notice.  Chest x ray with Subtle interstitial accentuation in the lungs could reflect early atypical pneumonia or drug reaction. Will start Levaquin to cover for Nosocomial PNA. Patient has been in the hospital since 10-28.  PRN albuterol.  She received Lovenox for DVT prophylaxis 10-29.   2-PNA; IV Levaquin. Blood culture ordered.   3-Right closed, displace Tibial plateau fracture; per Ortho.   4-Fever; probably related to PNA. Will also check Urine culture.   I will followup again tomorrow. Please contact me if I can be of assistance in the meanwhile. Thank you for this consultation.  Chief Complaint: wheezing.   HPI: 68 year old with PMH significant for hyperlipidemia, Arthritis, who presents after a Mechanical fall on 10-28 found to have right closed displace Tibial Plateau fracture. She was admitted by ortho on 10-28. Patient spike fever at 101 on 10-29. Today she was mildly hypoxic. She relates dry cough for last week. She denies dyspnea, chest pain, nausea or vomiting. No dysuria.    Review of Systems:  Negative, except as per HPI.   Past Medical History  Diagnosis Date  . Unspecified gastritis and gastroduodenitis without mention of hemorrhage   . Allergic rhinitis, cause unspecified   . Pure hypercholesterolemia   . Unspecified essential hypertension   . Esophageal reflux   . Abdominal pain, epigastric   . Arthritis   . Esophageal stenosis   . Anxiety   . Hiatal hernia   . Chronic headaches    Past Surgical History  Procedure Laterality Date   . Appendectomy  1984  . Tubal ligation      75 reversal in 81 and tubal in 85  . Laparoscopic cholecystectomy  04/2004  . Laporoscopy  1977    ovarian cyst  . Tubaligation    . Lateral collateral ligament repair, knee    . Hand surgery    . Total ankle replacement     Social History:  reports that she has never smoked. She has never used smokeless tobacco. She reports that she drinks about 1.8 ounces of alcohol per week. She reports that she does not use illicit drugs.  No Known Allergies Family History  Problem Relation Age of Onset  . Stomach cancer Brother 31    question if started pancreatic   . Diabetes Sister   . Heart disease Brother   . Colon cancer Neg Hx     Prior to Admission medications   Medication Sig Start Date End Date Taking? Authorizing Provider  aspirin 325 MG tablet Take 325 mg by mouth daily.   Yes Historical Provider, MD  DULoxetine (CYMBALTA) 30 MG capsule Take 30 mg by mouth daily.   Yes Historical Provider, MD  esomeprazole (NEXIUM) 40 MG packet Take 40 mg by mouth daily before breakfast. 05/16/10  Yes Sable Feil, MD  ezetimibe-simvastatin (VYTORIN) 10-10 MG per tablet Take 1 tablet by mouth at bedtime.   Yes Historical Provider, MD  ibuprofen (ADVIL,MOTRIN) 200 MG tablet Take 600 mg by mouth every 6 (six) hours as needed for moderate pain (knee pain).   Yes Historical Provider, MD  loratadine (CLARITIN)  10 MG tablet Take 10 mg by mouth daily.     Yes Historical Provider, MD  vitamin C (ASCORBIC ACID) 500 MG tablet Take 500 mg by mouth daily.   Yes Historical Provider, MD   Physical Exam: Blood pressure 119/50, pulse 93, temperature 98.7 F (37.1 C), temperature source Oral, resp. rate 18, height 5\' 4"  (1.626 m), weight 84.369 kg (186 lb), SpO2 90.00%. Filed Vitals:   12/23/13 0452  BP: 119/50  Pulse: 93  Temp: 98.7 F (37.1 C)  Resp: 18   General:  Appears calm and comfortable Eyes: PERRL, normal lids, irises & conjunctiva ENT: grossly  normal hearing, lips & tongue Neck: no LAD, masses or thyromegaly Cardiovascular: RRR, no m/r/g. No LE edema. Respiratory: CTA bilaterally, no w/r/r. Normal respiratory effort. Abdomen: soft, ntnd Skin: no rash or induration seen on limited exam Musculoskeletal: right LE with knee immobilizer.  Psychiatric: grossly normal mood and affect, speech fluent and appropriate Neurologic: grossly non-focal.     Labs on Admission:  Basic Metabolic Panel:  Recent Labs Lab 12/21/13 1928 12/22/13 0425 12/23/13 0540  NA 138 135* 140  K 3.9 4.4 3.8  CL 98 101 102  CO2 21 20 27   GLUCOSE 100* 132* 144*  BUN 17 11 6   CREATININE 0.83 0.74 0.71  CALCIUM 9.9 9.1 8.7   Liver Function Tests:  Recent Labs Lab 12/21/13 1928  AST 51*  ALT 35  ALKPHOS 118*  BILITOT 0.7  PROT 7.9  ALBUMIN 4.7   No results found for this basename: LIPASE, AMYLASE,  in the last 168 hours No results found for this basename: AMMONIA,  in the last 168 hours CBC:  Recent Labs Lab 12/21/13 1928 12/22/13 0425  WBC 11.7* 8.2  HGB 13.9 12.3  HCT 40.1 36.3  MCV 89.5 90.8  PLT 315 313   Cardiac Enzymes: No results found for this basename: CKTOTAL, CKMB, CKMBINDEX, TROPONINI,  in the last 168 hours BNP: No components found with this basename: POCBNP,  CBG:  Recent Labs Lab 12/23/13 0740  GLUCAP 115*    Radiological Exams on Admission: Dg Chest 1 View  12/23/2013   CLINICAL DATA:  Hypoxia.  Wheezing.  Preoperative for knee surgery.  EXAM: CHEST - 1 VIEW  COMPARISON:  01/18/2009; report from 01/02/2011  FINDINGS: Low lung volumes. Elevated right hemidiaphragm. Bibasilar scarring not changed from 2010.  Subtle interstitial accentuation in the lungs without a new airspace opacity.  IMPRESSION: 1. Subtle interstitial accentuation in the lungs could reflect early atypical pneumonia or drug reaction. 2. Bibasilar scarring. 3. Chronically elevated right hemidiaphragm.   Electronically Signed   By: Sherryl Barters M.D.   On: 12/23/2013 09:52   Ct Knee Right Wo Contrast  12/21/2013   CLINICAL DATA:  Tibial plateau fracture. Initial encounter. RIGHT knee swelling.  EXAM: CT OF THE RIGHT knee KNEE WITHOUT CONTRAST  TECHNIQUE: Multidetector CT imaging of the RIGHT knee knee was performed according to the standard protocol. Multiplanar CT image reconstructions were also generated.  COMPARISON:  12/21/2013 radiographs.  FINDINGS: Lipohemarthrosis is present. There is an lateral tibial plateau fracture with central depression measuring 8 mm. Fracture extends into the proximal tibiofibular joint. The distal femur appears intact. 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 Proximal fibula appears intact. Metaphysis intact.  Medial tibial plateau fracture is also present, only well seen on the sagittal images. The fracture of the medial tibial plateau is nondepressed (image 30 series 7). There is no cortical step-off.  IMPRESSION: Bicondylar  tibial plateau fracture with depression of the central lateral tibial plateau and no displacement or depression of the medial tibial plateau fracture. This is compatible with Schatzker V fracture.   Electronically Signed   By: Dereck Ligas M.D.   On: 12/21/2013 16:00   Dg Knee Complete 4 Views Right  12/21/2013   CLINICAL DATA:  Fall. Right knee injury and pain. Initial encounter.  EXAM: RIGHT KNEE - COMPLETE 4+ VIEW  COMPARISON:  None.  FINDINGS: Small to moderate knee joint effusion is seen. Non standard positioning limits evaluation, however there is a mildly depressed fracture involving the lateral tibial plateau and tibial spines. No evidence of dislocation. Generalized osteopenia noted.  IMPRESSION: Mildly depressed fracture involving the lateral tibial plateau and tibial spines.  Small to moderate knee joint effusion.  Osteopenia.   Electronically Signed   By: Earle Gell M.D.   On: 12/21/2013 14:30    EKG: ordered.   Time  spent: 75 minutes.   Niel Hummer A [facility] Pager [phone]  If 7PM-7AM, please contact night-coverage [email or web address] Password [email or web address] 12/23/2013, 12:25 PM

## 2013-12-23 NOTE — Progress Notes (Signed)
  Patient reports pain as mild and moderate.   Patient seen in rounds by Dr. Wynelle Link. They will be NWB to the right. Plan is to go Home after hospital stay.  Vital signs in last 24 hours: Temp:  [98.7 F (37.1 C)-101.5 F (38.6 C)] 101.5 F (38.6 C) (10/30 1352) Pulse Rate:  [93-103] 93 (10/30 1349) Resp:  [16-18] 16 (10/30 1349) BP: (119-135)/(50-59) 135/51 mmHg (10/30 1349) SpO2:  [90 %-97 %] 91 % (10/30 1349)  I&O's: I/O last 3 completed shifts: In: 3160 [P.O.:1320; I.V.:1790; IV Piggyback:50] Out: 7169 [Urine:4850] Total I/O In: 317.5 [I.V.:317.5] Out: 475 [Urine:475]  Labs:  Recent Labs  12/21/13 1928 12/22/13 0425  HGB 13.9 12.3    Recent Labs  12/21/13 1928 12/22/13 0425  WBC 11.7* 8.2  RBC 4.48 4.00  HCT 40.1 36.3  PLT 315 313    Recent Labs  12/22/13 0425 12/23/13 0540  NA 135* 140  K 4.4 3.8  CL 101 102  CO2 20 27  BUN 11 6  CREATININE 0.74 0.71  GLUCOSE 132* 144*  CALCIUM 9.1 8.7    Recent Labs  12/21/13 1928  INR 1.06     EXAM: General - Patient is Alert and Appropriate Extremity - Neurovascular intact Sensation intact distally Knee immobilizer for now Motor Function - intact, moving foot and toes well on exam.   Past Medical History  Diagnosis Date  . Unspecified gastritis and gastroduodenitis without mention of hemorrhage   . Allergic rhinitis, cause unspecified   . Pure hypercholesterolemia   . Unspecified essential hypertension   . Esophageal reflux   . Abdominal pain, epigastric   . Arthritis   . Esophageal stenosis   . Anxiety   . Hiatal hernia   . Chronic headaches     Assessment/Plan: Active Problems:   Tibial plateau fracture   Fever   Hospital acquired PNA  Estimated body mass index is 31.91 kg/(m^2) as calculated from the following:   Height as of this encounter: 5\' 4"  (1.626 m).   Weight as of this encounter: 84.369 kg (186 lb).  Plan was for surgery today.  Unable to proceed at lunch.  Developed  some hypoxia and noted wheezing.  Anesthesia suggested CXR and results showed: CHEST - 1 VIEW  COMPARISON: 01/18/2009; report from 01/02/2011  FINDINGS:  Low lung volumes. Elevated right hemidiaphragm. Bibasilar scarring  not changed from 2010.  Subtle interstitial accentuation in the lungs without a new airspace  opacity.  IMPRESSION:  1. Subtle interstitial accentuation in the lungs could reflect early  atypical pneumonia or drug reaction.  2. Bibasilar scarring.  3. Chronically elevated right hemidiaphragm.  Due to findings and the continued need for O2, Medicine consult called. Greatly appreciate consult. Started on Levaquin and PRN Albuterol.  DVT Prophylaxis - Lovenox NWB right leg  PRN meds  Foley to gravity  Tentative plan was changed to tomorrow morning but due to the recent findings, Dr. Wynelle Link will discuss the situation with the patient later this evening to determine future plans. Currently NPO after MN tonight in anticipation of the procedure.  Cassandra Muslim, PA-C Orthopaedic Surgery 12/23/2013, 4:53 PM

## 2013-12-23 NOTE — Progress Notes (Signed)
I have spoken again with Mrs. [name] and given her probable pneumonia coupled with the fevers and hypoxia we have decided to cancel the surgery for tomorrow morning and I will reschedule her for Monday afternoon as long as her pulmonary status has improved.

## 2013-12-24 DIAGNOSIS — J189 Pneumonia, unspecified organism: Secondary | ICD-10-CM

## 2013-12-24 LAB — BASIC METABOLIC PANEL
ANION GAP: 11 (ref 5–15)
BUN: 5 mg/dL — ABNORMAL LOW (ref 6–23)
CO2: 29 meq/L (ref 19–32)
CREATININE: 0.69 mg/dL (ref 0.50–1.10)
Calcium: 8.8 mg/dL (ref 8.4–10.5)
Chloride: 104 mEq/L (ref 96–112)
GFR calc Af Amer: >90 mL/min (ref >90)
GFR calc non Af Amer: 88 mL/min — ABNORMAL LOW (ref >90)
Glucose, Bld: 105 mg/dL — ABNORMAL HIGH (ref 70–99)
Potassium: 3.8 mEq/L (ref 3.7–5.3)
Sodium: 144 mEq/L (ref 137–147)

## 2013-12-24 MED ORDER — DOCUSATE SODIUM 100 MG PO CAPS
100.0000 mg | ORAL_CAPSULE | Freq: Two times a day (BID) | ORAL | Status: DC
  Administered 2013-12-24 – 2013-12-28 (×7): 100 mg via ORAL

## 2013-12-24 NOTE — Progress Notes (Signed)
[facility] PROGRESS NOTE  [name] SHF:[id number] DOB: 15-Aug-1945 DOA: 12/21/2013 PCP: PROVIDER NOT IN SYSTEM  Assessment/Plan: 1-Acute Hypoxic Respiratory failure.  Chest x ray with Subtle interstitial accentuation in the lungs could reflect early atypical pneumonia or drug reaction.  Continue with  Levaquin to cover for Nosocomial PNA. Patient has been in the hospital since 10-28.  PRN albuterol.  On Lovenox for DVT prophylaxis.    2-PNA; IV Levaquin day 2.  Blood culture pending.   3-Right closed, displace Tibial plateau fracture; per Ortho.   4-Fever; probably related to PNA. Urine culture pending.    Procedures:  none  Antibiotics:  Levaquin 10-30  HPI/Subjective: Denies dyspnea, chest pain. Feeling well.   Objective: Filed Vitals:   12/24/13 1345  BP: 121/60  Pulse: 80  Temp: 98.5 F (36.9 C)  Resp: 16    Intake/Output Summary (Last 24 hours) at 12/24/13 1400 Last data filed at 12/24/13 1345  Gross per 24 hour  Intake 1727.5 ml  Output   5175 ml  Net -3447.5 ml   Filed Weights   12/22/13 0730  Weight: 84.369 kg (186 lb)    Exam:   General:  No distress.   Cardiovascular: S 1, S 2 RRR  Respiratory: mild crackles bases.   Abdomen: Bs present, soft, nt  Musculoskeletal: no edema.   Data Reviewed: Basic Metabolic Panel:  Recent Labs Lab 12/21/13 1928 12/22/13 0425 12/23/13 0540 12/24/13 0500  NA 138 135* 140 144  K 3.9 4.4 3.8 3.8  CL 98 101 102 104  CO2 21 20 27 29   GLUCOSE 100* 132* 144* 105*  BUN 17 11 6  5*  CREATININE 0.83 0.74 0.71 0.69  CALCIUM 9.9 9.1 8.7 8.8   Liver Function Tests:  Recent Labs Lab 12/21/13 1928  AST 51*  ALT 35  ALKPHOS 118*  BILITOT 0.7  PROT 7.9  ALBUMIN 4.7   No results found for this basename: LIPASE, AMYLASE,  in the last 168 hours No results found for this basename: AMMONIA,  in the last 168 hours CBC:  Recent Labs Lab 12/21/13 1928 12/22/13 0425 12/23/13 1436  WBC  11.7* 8.2 10.2  HGB 13.9 12.3 11.1*  HCT 40.1 36.3 33.6*  MCV 89.5 90.8 92.8  PLT 315 313 239   Cardiac Enzymes: No results found for this basename: CKTOTAL, CKMB, CKMBINDEX, TROPONINI,  in the last 168 hours BNP (last 3 results) No results found for this basename: PROBNP,  in the last 8760 hours CBG:  Recent Labs Lab 12/23/13 0740  GLUCAP 115*    Recent Results (from the past 240 hour(s))  SURGICAL PCR SCREEN     Status: None   Collection Time    12/21/13 11:59 PM      Result Value Ref Range Status   MRSA, PCR NEGATIVE  NEGATIVE Final   Staphylococcus aureus NEGATIVE  NEGATIVE Final   Comment:            The Xpert SA Assay (FDA     approved for NASAL specimens     in patients over 41 years of age),     is one component of     a comprehensive surveillance     program.  Test performance has     been validated by [employer] for patients greater     than or equal to 19 year old.     It is not intended     to diagnose infection nor to  guide or monitor treatment.  CULTURE, BLOOD (ROUTINE X 2)     Status: None   Collection Time    12/23/13 12:59 PM      Result Value Ref Range Status   Specimen Description BLOOD LEFT ARM   Final   Special Requests BOTTLES DRAWN AEROBIC AND ANAEROBIC 10CC   Final   Culture  Setup Time     Final   Value: 12/23/2013 18:07     Performed at [facility]   Culture     Final   Value:        BLOOD CULTURE RECEIVED NO GROWTH TO DATE CULTURE WILL BE HELD FOR 5 DAYS BEFORE ISSUING A FINAL NEGATIVE REPORT     Performed at [facility]   Report Status PENDING   Incomplete  CULTURE, BLOOD (ROUTINE X 2)     Status: None   Collection Time    12/23/13  1:06 PM      Result Value Ref Range Status   Specimen Description BLOOD LEFT HAND   Final   Special Requests BOTTLES DRAWN AEROBIC ONLY 2CC   Final   Culture  Setup Time     Final   Value: 12/23/2013 18:10     Performed at [facility]   Culture     Final   Value:         BLOOD CULTURE RECEIVED NO GROWTH TO DATE CULTURE WILL BE HELD FOR 5 DAYS BEFORE ISSUING A FINAL NEGATIVE REPORT     Performed at [facility]   Report Status PENDING   Incomplete     Studies: Dg Chest 1 View  12/23/2013   CLINICAL DATA:  Hypoxia.  Wheezing.  Preoperative for knee surgery.  EXAM: CHEST - 1 VIEW  COMPARISON:  01/18/2009; report from 01/02/2011  FINDINGS: Low lung volumes. Elevated right hemidiaphragm. Bibasilar scarring not changed from 2010.  Subtle interstitial accentuation in the lungs without a new airspace opacity.  IMPRESSION: 1. Subtle interstitial accentuation in the lungs could reflect early atypical pneumonia or drug reaction. 2. Bibasilar scarring. 3. Chronically elevated right hemidiaphragm.   Electronically Signed   By: Sherryl Barters M.D.   On: 12/23/2013 09:52    Scheduled Meds: . DULoxetine  30 mg Oral Daily  . enoxaparin (LOVENOX) injection  40 mg Subcutaneous Q24H  . esomeprazole  40 mg Oral Q1200  . ezetimibe  10 mg Oral QHS  . levofloxacin (LEVAQUIN) IV  750 mg Intravenous Q1200  . loratadine  10 mg Oral Daily  . simvastatin  10 mg Oral QHS  . traZODone  50 mg Oral QHS   Continuous Infusions: . sodium chloride 50 mL/hr at 12/23/13 1851  . sodium chloride      Active Problems:   Tibial plateau fracture   Fever   Hospital acquired PNA    Time spent: 35 minutes.     Niel Hummer A  [facility] Pager [phone]. If 7PM-7AM, please contact night-coverage at [email or web address], password [facility] 12/24/2013, 2:00 PM  LOS: 3 days

## 2013-12-24 NOTE — Progress Notes (Signed)
Patient refused foot pumps; patient stated that they creates legs pain.

## 2013-12-24 NOTE — Progress Notes (Signed)
Subjective: [name] reports feeling well today. She was hoping that her knee would be fixed today, but understands that she needs to wait until Monday now. She has been started on IV levo for her PNA. She states that she is not currently having any CP or SOB. Her pain is well controlled in her knee; pain is 4/10 currently. She denies any new HA, CP, SOB, N, V, fever, chills, changes in appetite.   Objective: Vital signs in last 24 hours: Temp:  [98.3 F (36.8 C)-101.5 F (38.6 C)] 99.3 F (37.4 C) (10/31 0504) Pulse Rate:  [76-93] 89 (10/31 0504) Resp:  [16] 16 (10/31 0504) BP: (105-135)/(46-57) 119/46 mmHg (10/31 0504) SpO2:  [90 %-94 %] 94 % (10/31 0504)  Intake/Output from previous day: 10/30 0701 - 10/31 0700 In: 2425 [P.O.:1160; I.V.:1265] Out: 4525 [Urine:4525] Intake/Output this shift:     Recent Labs  12/21/13 1928 12/22/13 0425 12/23/13 1436  HGB 13.9 12.3 11.1*    Recent Labs  12/22/13 0425 12/23/13 1436  WBC 8.2 10.2  RBC 4.00 3.62*  HCT 36.3 33.6*  PLT 313 239    Recent Labs  12/23/13 0540 12/24/13 0500  NA 140 144  K 3.8 3.8  CL 102 104  CO2 27 29  BUN 6 5*  CREATININE 0.71 0.69  GLUCOSE 144* 105*  CALCIUM 8.7 8.8    Recent Labs  12/21/13 1928  INR 1.06    Physcial exam: WD/WN female in nad. A and O x4. Mood and affect appropriate. EOMI. Respirations normal and unlabored with 2L [place] in place. Abdomen soft and non-tender. R knee in immobilizer. NV intact with 2+ distal pulsesl and 5/5 strength of toe extensors and flexors bilaterally. Distal sensation intact bilaterally. No lymphadenopathy. No calf swelling or palpable cords.    Assessment/Plan: Closed R tibial plateau fracture: -NWB RLE; in immobilizer -Continue pain medications  -On lovenox for DVT prophylaxis -Continue IV ABX tx -Plan for ORIF with Dr. Elmyra Ricks on Monday, 11/2.   [name] 12/24/2013, 8:42 AM  [phone]

## 2013-12-25 DIAGNOSIS — R509 Fever, unspecified: Secondary | ICD-10-CM

## 2013-12-25 DIAGNOSIS — M79609 Pain in unspecified limb: Secondary | ICD-10-CM

## 2013-12-25 DIAGNOSIS — N390 Urinary tract infection, site not specified: Secondary | ICD-10-CM

## 2013-12-25 LAB — CBC
HEMATOCRIT: 33.6 % — AB (ref 36.0–46.0)
Hemoglobin: 11 g/dL — ABNORMAL LOW (ref 12.0–15.0)
MCH: 30.2 pg (ref 26.0–34.0)
MCHC: 32.7 g/dL (ref 30.0–36.0)
MCV: 92.3 fL (ref 78.0–100.0)
Platelets: 229 10*3/uL (ref 150–400)
RBC: 3.64 MIL/uL — ABNORMAL LOW (ref 3.87–5.11)
RDW: 12.8 % (ref 11.5–15.5)
WBC: 6.5 10*3/uL (ref 4.0–10.5)

## 2013-12-25 NOTE — Progress Notes (Signed)
Subjective: Patient complains of right calf pain.  No complaints of chest pain.  sched for orif tibial plateau fracture tomorrow.    Objective: Vital signs in last 24 hours: Temp:  [98.4 F (36.9 C)-98.5 F (36.9 C)] 98.4 F (36.9 C) (11/01 0620) Pulse Rate:  [80-89] 89 (11/01 0620) Resp:  [16-20] 20 (11/01 0620) BP: (116-123)/(47-60) 116/51 mmHg (11/01 0620) SpO2:  [93 %-96 %] 93 % (11/01 0620)  Intake/Output from previous day: 10/31 0701 - 11/01 0700 In: 920 [P.O.:920] Out: 2750 [Urine:2750] Intake/Output this shift: Total I/O In: 240 [P.O.:240] Out: 700 [Urine:700]   Recent Labs  12/23/13 1436 12/25/13 0529  HGB 11.1* 11.0*    Recent Labs  12/23/13 1436 12/25/13 0529  WBC 10.2 6.5  RBC 3.62* 3.64*  HCT 33.6* 33.6*  PLT 239 229    Recent Labs  12/23/13 0540 12/24/13 0500  NA 140 144  K 3.8 3.8  CL 102 104  CO2 27 29  BUN 6 5*  CREATININE 0.71 0.69  GLUCOSE 144* 105*  CALCIUM 8.7 8.8   No results for input(s): LABPT, INR in the last 72 hours.  Exam:  Some knee and calf swelling.  Compartments soft.  Marked right calf ttp.  NVI.    Assessment/Plan: Will get STAT venous doppler right LE today.  Needs to be done before surgical procedure tomorrow.    Shaunessy Dobratz M 12/25/2013, 12:26 PM

## 2013-12-25 NOTE — Progress Notes (Signed)
[facility] PROGRESS NOTE  [name] HGD:[id number] DOB: 11-06-1945 DOA: 12/21/2013 PCP: PROVIDER NOT IN SYSTEM  Assessment/Plan: 1-Acute Hypoxic Respiratory failure. Improved.  Chest x ray with Subtle interstitial accentuation in the lungs could reflect early atypical pneumonia or drug reaction.  Continue with  Levaquin to cover for Nosocomial PNA. Patient has been in the hospital since 10-28.  PRN albuterol.  On Lovenox for DVT prophylaxis.    2-PNA; IV Levaquin day 3.  Blood culture no growth to date.   3-Right closed, displace Tibial plateau fracture; per Ortho.  Patient complaining of calf pain, ortho order Doppler.   4-Fever; probably related to PNA. Urine growing Gram negative Rods.   5-UTI: Urine culture growing gram negative rods. Continue with Levaquin.    Procedures:  none  Antibiotics:  Levaquin 10-30  HPI/Subjective: Feeling ok, no chest pain, no dyspnea.  She complained of calf pain today.   Objective: Filed Vitals:   12/25/13 0620  BP: 116/51  Pulse: 89  Temp: 98.4 F (36.9 C)  Resp: 20    Intake/Output Summary (Last 24 hours) at 12/25/13 1254 Last data filed at 12/25/13 1116  Gross per 24 hour  Intake   1040 ml  Output   3450 ml  Net  -2410 ml   Filed Weights   12/22/13 0730  Weight: 84.369 kg (186 lb)    Exam:   General:  No distress.   Cardiovascular: S 1, S 2 RRR  Respiratory: CTA  Abdomen: Bs present, soft, nt  Musculoskeletal: no edema.   Data Reviewed: Basic Metabolic Panel:  Recent Labs Lab 12/21/13 1928 12/22/13 0425 12/23/13 0540 12/24/13 0500  NA 138 135* 140 144  K 3.9 4.4 3.8 3.8  CL 98 101 102 104  CO2 21 20 27 29   GLUCOSE 100* 132* 144* 105*  BUN 17 11 6  5*  CREATININE 0.83 0.74 0.71 0.69  CALCIUM 9.9 9.1 8.7 8.8   Liver Function Tests:  Recent Labs Lab 12/21/13 1928  AST 51*  ALT 35  ALKPHOS 118*  BILITOT 0.7  PROT 7.9  ALBUMIN 4.7   No results for input(s): LIPASE, AMYLASE  in the last 168 hours. No results for input(s): AMMONIA in the last 168 hours. CBC:  Recent Labs Lab 12/21/13 1928 12/22/13 0425 12/23/13 1436 12/25/13 0529  WBC 11.7* 8.2 10.2 6.5  HGB 13.9 12.3 11.1* 11.0*  HCT 40.1 36.3 33.6* 33.6*  MCV 89.5 90.8 92.8 92.3  PLT 315 313 239 229   Cardiac Enzymes: No results for input(s): CKTOTAL, CKMB, CKMBINDEX, TROPONINI in the last 168 hours. BNP (last 3 results) No results for input(s): PROBNP in the last 8760 hours. CBG:  Recent Labs Lab 12/23/13 0740  GLUCAP 115*    Recent Results (from the past 240 hour(s))  Surgical PCR screen     Status: None   Collection Time: 12/21/13 11:59 PM  Result Value Ref Range Status   MRSA, PCR NEGATIVE NEGATIVE Final   Staphylococcus aureus NEGATIVE NEGATIVE Final    Comment:        The Xpert SA Assay (FDA approved for NASAL specimens in patients over 25 years of age), is one component of a comprehensive surveillance program.  Test performance has been validated by [employer] for patients greater than or equal to 84 year old. It is not intended to diagnose infection nor to guide or monitor treatment.  Culture, blood (routine x 2)     Status: None (Preliminary result)   Collection Time:  12/23/13 12:59 PM  Result Value Ref Range Status   Specimen Description BLOOD LEFT ARM  Final   Special Requests BOTTLES DRAWN AEROBIC AND ANAEROBIC 10CC  Final   Culture  Setup Time   Final    12/23/2013 18:07 Performed at [facility]   Culture   Final           BLOOD CULTURE RECEIVED NO GROWTH TO DATE CULTURE WILL BE HELD FOR 5 DAYS BEFORE ISSUING A FINAL NEGATIVE REPORT Performed at [facility]   Report Status PENDING  Incomplete  Culture, blood (routine x 2)     Status: None (Preliminary result)   Collection Time: 12/23/13  1:06 PM  Result Value Ref Range Status   Specimen Description BLOOD LEFT HAND  Final   Special Requests BOTTLES DRAWN AEROBIC ONLY 2CC  Final   Culture   Setup Time   Final    12/23/2013 18:10 Performed at [facility]   Culture   Final           BLOOD CULTURE RECEIVED NO GROWTH TO DATE CULTURE WILL BE HELD FOR 5 DAYS BEFORE ISSUING A FINAL NEGATIVE REPORT Performed at [facility]   Report Status PENDING  Incomplete  Urine culture     Status: None (Preliminary result)   Collection Time: 12/23/13  1:49 PM  Result Value Ref Range Status   Specimen Description URINE, CLEAN CATCH  Final   Special Requests NONE  Final   Culture  Setup Time   Final    12/24/2013 00:21 Performed at [place]   Final    >=100,000 COLONIES/ML Performed at [place] Performed at [facility]    Report Status PENDING  Incomplete     Studies: No results found.  Scheduled Meds: . docusate sodium  100 mg Oral BID  . DULoxetine  30 mg Oral Daily  . enoxaparin (LOVENOX) injection  40 mg Subcutaneous Q24H  . esomeprazole  40 mg Oral Q1200  . ezetimibe  10 mg Oral QHS  . levofloxacin (LEVAQUIN) IV  750 mg Intravenous Q1200  . loratadine  10 mg Oral Daily  . simvastatin  10 mg Oral QHS  . traZODone  50 mg Oral QHS   Continuous Infusions: . sodium chloride 50 mL/hr at 12/24/13 1620    Active Problems:   Tibial plateau fracture   Fever   Hospital acquired PNA    Time spent: 25 minutes.     Niel Hummer A  [facility] Pager [phone]. If 7PM-7AM, please contact night-coverage at [email or web address], password [facility] 12/25/2013, 12:54 PM  LOS: 4 days

## 2013-12-25 NOTE — Progress Notes (Signed)
CARE MANAGEMENT NOTE 12/25/2013  Patient:  [name], [name]   Account Number:  [id number]  Date Initiated:  12/25/2013  Documentation initiated by:  [facility]  Subjective/Objective Assessment:   orif tibial plateau fracture     Action/Plan:   waiting final recommendation   Anticipated DC Date:     Anticipated DC Plan:  [place]  CM consult      Choice offered to / List presented to:             Status of service:  In process, will continue to follow Medicare Important Message given?  YES (If response is "NO", the following Medicare IM given date fields will be blank) Date Medicare IM given:  12/25/2013 Medicare IM given by:  [facility] Date Additional Medicare IM given:   Additional Medicare IM given by:    Discharge Disposition:    Per UR Regulation:    If discussed at Long Length of Stay Meetings, dates discussed:    Comments:  12/25/2013 1130 NCM spoke to pt and states she has walker at home. She needs wheels for walker. [facility] supplied pt with wheels for her walker. Ex-husband will bring her walker to ensure wheels work. She has bedside commode at home. Agreeable to [facility] for [facility] if needed at dc. Jonnie Finner RN CCM Case Mgmt phone [phone]

## 2013-12-25 NOTE — Progress Notes (Signed)
VASCULAR LAB PRELIMINARY  PRELIMINARY  PRELIMINARY  PRELIMINARY  Right lower extremity venous Doppler completed.    Preliminary report:  There is no DVT or SVT noted in the right lower extremity.   Taren Toops, RVT 12/25/2013, 2:24 PM

## 2013-12-26 ENCOUNTER — Inpatient Hospital Stay (HOSPITAL_COMMUNITY): Payer: Medicare Other

## 2013-12-26 ENCOUNTER — Encounter (HOSPITAL_COMMUNITY): Payer: Self-pay | Admitting: Certified Registered Nurse Anesthetist

## 2013-12-26 ENCOUNTER — Inpatient Hospital Stay (HOSPITAL_COMMUNITY): Payer: Medicare Other | Admitting: Certified Registered Nurse Anesthetist

## 2013-12-26 ENCOUNTER — Encounter (HOSPITAL_COMMUNITY)
Admission: EM | Disposition: A | Discharge status: Home Health | Payer: Self-pay | Source: Home / Self Care | Attending: Orthopedic Surgery

## 2013-12-26 DIAGNOSIS — N3000 Acute cystitis without hematuria: Secondary | ICD-10-CM

## 2013-12-26 DIAGNOSIS — S82141F Displaced bicondylar fracture of right tibia, subsequent encounter for open fracture type IIIA, IIIB, or IIIC with routine healing: Secondary | ICD-10-CM

## 2013-12-26 HISTORY — PX: ORIF TIBIA PLATEAU: SHX2132

## 2013-12-26 LAB — URINE CULTURE

## 2013-12-26 SURGERY — OPEN REDUCTION INTERNAL FIXATION (ORIF) TIBIAL PLATEAU
Anesthesia: General | Site: Knee | Laterality: Right

## 2013-12-26 MED ORDER — DEXAMETHASONE SODIUM PHOSPHATE 10 MG/ML IJ SOLN
INTRAMUSCULAR | Status: AC
  Filled 2013-12-26: qty 1

## 2013-12-26 MED ORDER — BISACODYL 10 MG RE SUPP: 10.0000 mg | Freq: Every day | RECTAL | Status: DC | PRN

## 2013-12-26 MED ORDER — CEFAZOLIN SODIUM-DEXTROSE 2-3 GM-% IV SOLR
INTRAVENOUS | Status: DC | PRN
  Administered 2013-12-26: 2 g via INTRAVENOUS

## 2013-12-26 MED ORDER — ENOXAPARIN SODIUM 40 MG/0.4ML ~~LOC~~ SOLN
40.0000 mg | SUBCUTANEOUS | Status: DC
  Administered 2013-12-27 – 2013-12-28 (×2): 40 mg via SUBCUTANEOUS
  Filled 2013-12-26 (×3): qty 0.4

## 2013-12-26 MED ORDER — OXYCODONE HCL 5 MG PO TABS: 5.0000 mg | ORAL_TABLET | Freq: Once | ORAL | Status: AC | PRN

## 2013-12-26 MED ORDER — FENTANYL CITRATE 0.05 MG/ML IJ SOLN
INTRAMUSCULAR | Status: AC
Start: 2013-12-26 — End: 2013-12-26
  Filled 2013-12-26: qty 2

## 2013-12-26 MED ORDER — POLYETHYLENE GLYCOL 3350 17 G PO PACK: 17.0000 g | PACK | Freq: Every day | ORAL | Status: DC | PRN

## 2013-12-26 MED ORDER — LACTATED RINGERS IV SOLN
INTRAVENOUS | Status: DC
  Administered 2013-12-27 (×2): via INTRAVENOUS

## 2013-12-26 MED ORDER — ONDANSETRON HCL 4 MG/2ML IJ SOLN: 4.0000 mg | Freq: Four times a day (QID) | INTRAMUSCULAR | Status: DC | PRN

## 2013-12-26 MED ORDER — MIDAZOLAM HCL 2 MG/2ML IJ SOLN
INTRAMUSCULAR | Status: AC
  Filled 2013-12-26: qty 2

## 2013-12-26 MED ORDER — ONDANSETRON HCL 4 MG/2ML IJ SOLN
INTRAMUSCULAR | Status: AC
  Filled 2013-12-26: qty 2

## 2013-12-26 MED ORDER — MIDAZOLAM HCL 5 MG/5ML IJ SOLN
INTRAMUSCULAR | Status: DC | PRN
  Administered 2013-12-26 (×2): 1 mg via INTRAVENOUS

## 2013-12-26 MED ORDER — ALBUTEROL SULFATE HFA 108 (90 BASE) MCG/ACT IN AERS
INHALATION_SPRAY | RESPIRATORY_TRACT | Status: DC | PRN
  Administered 2013-12-26: 5 via RESPIRATORY_TRACT

## 2013-12-26 MED ORDER — ACETAMINOPHEN 160 MG/5ML PO SOLN: 325.0000 mg | ORAL | Status: DC | PRN

## 2013-12-26 MED ORDER — OXYCODONE HCL 5 MG/5ML PO SOLN: 5.0000 mg | Freq: Once | ORAL | Status: AC | PRN

## 2013-12-26 MED ORDER — ACETAMINOPHEN 325 MG PO TABS
325.0000 mg | ORAL_TABLET | ORAL | Status: DC | PRN
  Administered 2013-12-26 – 2013-12-28 (×4): 650 mg via ORAL
  Filled 2013-12-26 (×4): qty 2

## 2013-12-26 MED ORDER — FENTANYL CITRATE 0.05 MG/ML IJ SOLN
INTRAMUSCULAR | Status: AC
  Filled 2013-12-26: qty 5

## 2013-12-26 MED ORDER — 0.9 % SODIUM CHLORIDE (POUR BTL) OPTIME
TOPICAL | Status: DC | PRN
  Administered 2013-12-26: 1000 mL

## 2013-12-26 MED ORDER — LACTATED RINGERS IV SOLN
INTRAVENOUS | Status: DC
  Administered 2013-12-26: 1000 mL via INTRAVENOUS

## 2013-12-26 MED ORDER — DOCUSATE SODIUM 100 MG PO CAPS: 100.0000 mg | ORAL_CAPSULE | Freq: Two times a day (BID) | ORAL | Status: DC

## 2013-12-26 MED ORDER — PROPOFOL 10 MG/ML IV BOLUS
INTRAVENOUS | Status: AC
  Filled 2013-12-26: qty 20

## 2013-12-26 MED ORDER — FENTANYL CITRATE 0.05 MG/ML IJ SOLN
INTRAMUSCULAR | Status: DC | PRN
  Administered 2013-12-26: 100 ug via INTRAVENOUS
  Administered 2013-12-26 (×5): 50 ug via INTRAVENOUS

## 2013-12-26 MED ORDER — ALBUTEROL SULFATE HFA 108 (90 BASE) MCG/ACT IN AERS
INHALATION_SPRAY | RESPIRATORY_TRACT | Status: AC
  Filled 2013-12-26: qty 6.7

## 2013-12-26 MED ORDER — ONDANSETRON HCL 4 MG PO TABS: 4.0000 mg | ORAL_TABLET | Freq: Four times a day (QID) | ORAL | Status: DC | PRN

## 2013-12-26 MED ORDER — DEXAMETHASONE SODIUM PHOSPHATE 10 MG/ML IJ SOLN
INTRAMUSCULAR | Status: DC | PRN
  Administered 2013-12-26: 10 mg via INTRAVENOUS

## 2013-12-26 MED ORDER — DULOXETINE HCL 30 MG PO CPEP
30.0000 mg | ORAL_CAPSULE | Freq: Every day | ORAL | Status: DC
  Administered 2013-12-26 – 2013-12-28 (×3): 30 mg via ORAL
  Filled 2013-12-26 (×5): qty 1

## 2013-12-26 MED ORDER — MORPHINE SULFATE 2 MG/ML IJ SOLN: 1.0000 mg | INTRAMUSCULAR | Status: DC | PRN

## 2013-12-26 MED ORDER — METOPROLOL TARTRATE 1 MG/ML IV SOLN
INTRAVENOUS | Status: AC
  Filled 2013-12-26: qty 5

## 2013-12-26 MED ORDER — SUCCINYLCHOLINE CHLORIDE 20 MG/ML IJ SOLN
INTRAMUSCULAR | Status: DC | PRN
  Administered 2013-12-26: 100 mg via INTRAVENOUS

## 2013-12-26 MED ORDER — HYDROMORPHONE HCL 1 MG/ML IJ SOLN
0.2500 mg | INTRAMUSCULAR | Status: DC | PRN
  Administered 2013-12-26 (×2): 0.5 mg via INTRAVENOUS

## 2013-12-26 MED ORDER — PROPOFOL 10 MG/ML IV BOLUS
INTRAVENOUS | Status: DC | PRN
  Administered 2013-12-26 (×2): 50 mg via INTRAVENOUS
  Administered 2013-12-26: 160 mg via INTRAVENOUS

## 2013-12-26 MED ORDER — ONDANSETRON HCL 4 MG/2ML IJ SOLN
INTRAMUSCULAR | Status: DC | PRN
  Administered 2013-12-26: 4 mg via INTRAVENOUS

## 2013-12-26 MED ORDER — LACTATED RINGERS IV SOLN
INTRAVENOUS | Status: DC | PRN
  Administered 2013-12-26 (×3): via INTRAVENOUS

## 2013-12-26 MED ORDER — METOCLOPRAMIDE HCL 10 MG PO TABS: 5.0000 mg | ORAL_TABLET | Freq: Three times a day (TID) | ORAL | Status: DC | PRN

## 2013-12-26 MED ORDER — LIDOCAINE HCL (CARDIAC) 20 MG/ML IV SOLN
INTRAVENOUS | Status: DC | PRN
Start: 2013-12-26 — End: 2013-12-26
  Administered 2013-12-26: 80 mg via INTRAVENOUS

## 2013-12-26 MED ORDER — FLEET ENEMA 7-19 GM/118ML RE ENEM: 1.0000 | ENEMA | Freq: Once | RECTAL | Status: AC | PRN

## 2013-12-26 MED ORDER — STERILE WATER FOR IRRIGATION IR SOLN
Status: DC | PRN
  Administered 2013-12-26: 1500 mL

## 2013-12-26 MED ORDER — METOCLOPRAMIDE HCL 5 MG/ML IJ SOLN: 5.0000 mg | Freq: Three times a day (TID) | INTRAMUSCULAR | Status: DC | PRN

## 2013-12-26 MED ORDER — HYDROMORPHONE HCL 1 MG/ML IJ SOLN
INTRAMUSCULAR | Status: AC
  Filled 2013-12-26: qty 1

## 2013-12-26 MED ORDER — CEFAZOLIN SODIUM-DEXTROSE 2-3 GM-% IV SOLR
INTRAVENOUS | Status: AC
  Filled 2013-12-26: qty 50

## 2013-12-26 MED ORDER — METOPROLOL TARTRATE 1 MG/ML IV SOLN
INTRAVENOUS | Status: DC | PRN
  Administered 2013-12-26: 2 mg via INTRAVENOUS

## 2013-12-26 MED ORDER — OXYCODONE HCL 5 MG PO TABS
5.0000 mg | ORAL_TABLET | ORAL | Status: DC | PRN
  Administered 2013-12-27 (×2): 15 mg via ORAL
  Administered 2013-12-27 (×3): 10 mg via ORAL
  Administered 2013-12-28 (×3): 15 mg via ORAL
  Filled 2013-12-26: qty 3
  Filled 2013-12-26: qty 2
  Filled 2013-12-26 (×4): qty 3
  Filled 2013-12-26 (×2): qty 2

## 2013-12-26 SURGICAL SUPPLY — 67 items
BAG SPEC THK2 15X12 ZIP CLS (MISCELLANEOUS) ×1
BAG ZIPLOCK 12X15 (MISCELLANEOUS) ×2 IMPLANT
BANDAGE ELASTIC 6 VELCRO ST LF (GAUZE/BANDAGES/DRESSINGS) ×1 IMPLANT
BANDAGE ESMARK 6X9 LF (GAUZE/BANDAGES/DRESSINGS) ×1 IMPLANT
BIT DRILL 100X2.5XANTM LCK (BIT) IMPLANT
BIT DRILL CAL (BIT) IMPLANT
BIT DRILL SLEEVE MEASURING (BIT) IMPLANT
BIT DRL 100X2.5XANTM LCK (BIT) ×1
BNDG CMPR 9X6 STRL LF SNTH (GAUZE/BANDAGES/DRESSINGS) ×1
BNDG ESMARK 6X9 LF (GAUZE/BANDAGES/DRESSINGS) ×2
BONE VOID FILLER CERAMENT (Orthopedic Implant) ×1 IMPLANT
CLOSURE STERI-STRIP 1/4X4 (GAUZE/BANDAGES/DRESSINGS) ×1 IMPLANT
CUFF TOURN SGL QUICK 34 (TOURNIQUET CUFF) ×2
CUFF TRNQT CYL 34X4X40X1 (TOURNIQUET CUFF) ×1 IMPLANT
DRAPE C-ARM 42X120 X-RAY (DRAPES) ×2 IMPLANT
DRAPE EXTREMITY T 121X128X90 (DRAPE) ×1 IMPLANT
DRAPE POUCH INSTRU U-SHP 10X18 (DRAPES) ×1 IMPLANT
DRAPE SHEET LG 3/4 BI-LAMINATE (DRAPES) ×2 IMPLANT
DRAPE U-SHAPE 47X51 STRL (DRAPES) ×2 IMPLANT
DRILL BIT 2.5MM (BIT) ×2
DRILL BIT CAL (BIT) ×2
DRILL SLEEVE MEASURING (BIT) ×2
DRSG ADAPTIC 3X8 NADH LF (GAUZE/BANDAGES/DRESSINGS) ×1 IMPLANT
DRSG EMULSION OIL 3X16 NADH (GAUZE/BANDAGES/DRESSINGS) ×2 IMPLANT
DURAPREP 26ML APPLICATOR (WOUND CARE) ×2 IMPLANT
ELECT REM PT RETURN 9FT ADLT (ELECTROSURGICAL) ×2
ELECTRODE REM PT RTRN 9FT ADLT (ELECTROSURGICAL) ×1 IMPLANT
GAUZE SPONGE 4X4 12PLY STRL (GAUZE/BANDAGES/DRESSINGS) ×1 IMPLANT
GLOVE BIO SURGEON STRL SZ7.5 (GLOVE) ×2 IMPLANT
GLOVE BIO SURGEON STRL SZ8 (GLOVE) ×2 IMPLANT
GLOVE BIOGEL PI IND STRL 8 (GLOVE) ×3 IMPLANT
GLOVE BIOGEL PI INDICATOR 8 (GLOVE) ×3
GOWN STRL REUS W/TWL LRG LVL3 (GOWN DISPOSABLE) ×2 IMPLANT
GOWN STRL REUS W/TWL XL LVL3 (GOWN DISPOSABLE) ×2 IMPLANT
IMMOBILIZER KNEE 20 (SOFTGOODS) ×3 IMPLANT
IMMOBILIZER KNEE 20 THIGH 36 (SOFTGOODS) ×1 IMPLANT
K-WIRE ACE 1.6X6 (WIRE) ×6
KIT BASIN OR (CUSTOM PROCEDURE TRAY) ×2 IMPLANT
KWIRE ACE 1.6X6 (WIRE) IMPLANT
MANIFOLD NEPTUNE II (INSTRUMENTS) ×2 IMPLANT
NS IRRIG 1000ML POUR BTL (IV SOLUTION) ×2 IMPLANT
PACK TOTAL JOINT (CUSTOM PROCEDURE TRAY) ×2 IMPLANT
PAD ABD 8X10 STRL (GAUZE/BANDAGES/DRESSINGS) ×1 IMPLANT
PAD CAST 4YDX4 CTTN HI CHSV (CAST SUPPLIES) ×2 IMPLANT
PADDING CAST COTTON 4X4 STRL (CAST SUPPLIES) ×4
PADDING CAST COTTON 6X4 STRL (CAST SUPPLIES) ×4 IMPLANT
PLATE LOCK 5H STD RT PROX TIB (Plate) ×1 IMPLANT
POSITIONER SURGICAL ARM (MISCELLANEOUS) ×2 IMPLANT
SCREW CORTICAL 3.5MM 36MM (Screw) ×1 IMPLANT
SCREW CORTICAL 3.5MM 38MM (Screw) ×1 IMPLANT
SCREW CORTICAL 3.5MM 40MM (Screw) ×1 IMPLANT
SCREW LOCK CORT STAR 3.5X50 (Screw) ×1 IMPLANT
SCREW LOCK CORT STAR 3.5X56 (Screw) ×1 IMPLANT
SCREW LOCK CORT STAR 3.5X65 (Screw) ×1 IMPLANT
SCREW LP 3.5X70MM (Screw) ×1 IMPLANT
SCREW LP 3.5X75MM (Screw) ×2 IMPLANT
SPONGE LAP 18X18 X RAY DECT (DISPOSABLE) ×4 IMPLANT
STOCKINETTE 8 INCH (MISCELLANEOUS) ×2 IMPLANT
STRIP CLOSURE SKIN 1/2X4 (GAUZE/BANDAGES/DRESSINGS) ×2 IMPLANT
SUCTION FRAZIER TIP 10 FR DISP (SUCTIONS) ×2 IMPLANT
SUT ETHILON 4 0 PS 2 18 (SUTURE) ×2 IMPLANT
SUT MNCRL AB 4-0 PS2 18 (SUTURE) ×2 IMPLANT
SUT VIC AB 1 CT1 27 (SUTURE) ×10
SUT VIC AB 1 CT1 27XBRD ANTBC (SUTURE) ×5 IMPLANT
SUT VIC AB 2-0 CT2 27 (SUTURE) ×2 IMPLANT
TOWEL OR 17X26 10 PK STRL BLUE (TOWEL DISPOSABLE) ×4 IMPLANT
WATER STERILE IRR 1500ML POUR (IV SOLUTION) ×2 IMPLANT

## 2013-12-26 NOTE — Plan of Care (Signed)
Problem: Phase I Progression Outcomes Goal: Clear liquids, advance diet as tolerated Outcome: Completed/Met Date Met:  12/26/13

## 2013-12-26 NOTE — Plan of Care (Signed)
Problem: Phase II Progression Outcomes Goal: Encourage coughing & deep breathing Outcome: Completed/Met Date Met:  12/26/13 Goal: Wean O2 if indicated Outcome: Not Applicable Date Met:  60/60/04 Goal: Pain controlled Outcome: Completed/Met Date Met:  12/26/13 Goal: Tolerating diet Outcome: Completed/Met Date Met:  12/26/13

## 2013-12-26 NOTE — Plan of Care (Signed)
Problem: Phase I Progression Outcomes Goal: Post op CMS Neurovascular WDL Outcome: Completed/Met Date Met:  12/26/13

## 2013-12-26 NOTE — Transfer of Care (Signed)
Immediate Anesthesia Transfer of Care Note  Patient: [name]  Procedure(s) Performed: Procedure(s): OPEN REDUCTION INTERNAL FIXATION (ORIF) TIBIAL PLATEAU (Right)  Patient Location: PACU  Anesthesia Type:General  Level of Consciousness: Patient easily awoken, sedated, comfortable, cooperative, following commands, responds to stimulation.   Airway & Oxygen Therapy: Patient spontaneously breathing, ventilating well, oxygen via simple oxygen mask.  Post-op Assessment: Report given to PACU RN, vital signs reviewed and stable, moving all extremities.   Post vital signs: Reviewed and stable.  Complications: No apparent anesthesia complications

## 2013-12-26 NOTE — Progress Notes (Signed)
  12/26/2013 Day of Surgery Procedure(s) (LRB): OPEN REDUCTION INTERNAL FIXATION (ORIF) TIBIAL PLATEAU (Right) Patient reports pain as mild and moderate.   Patient seen in rounds with Dr. Wynelle Link. Patient is well, and has had no acute complaints or problems They will be NWB to the right. Plan is to go Home after hospital stay.  Vital signs in last 24 hours: Temp:  [98 F (36.7 C)-98.8 F (37.1 C)] 98 F (36.7 C) (11/02 0551) Pulse Rate:  [85-98] 98 (11/02 0551) Resp:  [16-20] 16 (11/02 0551) BP: (115-127)/(48-55) 127/55 mmHg (11/02 0551) SpO2:  [97 %-98 %] 97 % (11/02 0551)  I&O's: I/O last 3 completed shifts: In: 1280 [P.O.:1280] Out: [place]:  Recent Labs  12/23/13 1436 12/25/13 0529  HGB 11.1* 11.0*    Recent Labs  12/23/13 1436 12/25/13 0529  WBC 10.2 6.5  RBC 3.62* 3.64*  HCT 33.6* 33.6*  PLT 239 229    Recent Labs  12/24/13 0500  NA 144  K 3.8  CL 104  CO2 29  BUN 5*  CREATININE 0.69  GLUCOSE 105*  CALCIUM 8.8   No results for input(s): LABPT, INR in the last 72 hours.   EXAM: General - Patient is Alert and Appropriate Extremity - Neurovascular intact Sensation intact distally Motor Function - intact, moving foot and toes well on exam.    Past Medical History  Diagnosis Date  . Unspecified gastritis and gastroduodenitis without mention of hemorrhage   . Allergic rhinitis, cause unspecified   . Pure hypercholesterolemia   . Unspecified essential hypertension   . Esophageal reflux   . Abdominal pain, epigastric   . Arthritis   . Esophageal stenosis   . Anxiety   . Hiatal hernia   . Chronic headaches     Assessment/Plan: Day of Surgery Procedure(s) (LRB): OPEN REDUCTION INTERNAL FIXATION (ORIF) TIBIAL PLATEAU (Right) Active Problems:   Tibial plateau fracture   Fever   Hospital acquired PNA   UTI (urinary tract infection)  Estimated body mass index is 31.91 kg/(m^2) as calculated from the following:  Height as of this encounter: 5\' 4"  (1.626 m).   Weight as of this encounter: 84.369 kg (186 lb). Plan for surgery this afternoon.  Liquid breakfast and then NPO for this afternoon.  Arlee Muslim, PA-C Orthopaedic Surgery 12/26/2013, 7:08 AM

## 2013-12-26 NOTE — Interval H&P Note (Signed)
History and Physical Interval Note:  12/26/2013 4:38 PM  [name]  has presented today for surgery, with the diagnosis of right tibial plateau fx  The various methods of treatment have been discussed with the patient and family. After consideration of risks, benefits and other options for treatment, the patient has consented to  Procedure(s): OPEN REDUCTION INTERNAL FIXATION (ORIF) TIBIAL PLATEAU (Right) as a surgical intervention .  The patient's history has been reviewed, patient examined, no change in status, stable for surgery.  I have reviewed the patient's chart and labs.  Questions were answered to the patient's satisfaction.     [name]

## 2013-12-26 NOTE — Progress Notes (Signed)
[facility] PROGRESS NOTE  [name] PYP:[id number] DOB: [birth year] DOA: 12/21/2013 PCP: PROVIDER NOT IN SYSTEM  Assessment/Plan: 1-Acute Hypoxic Respiratory failure. Improved.  Chest x ray with Subtle interstitial accentuation in the lungs could reflect early atypical pneumonia or drug reaction.  Continue with  Levaquin to cover for Nosocomial PNA. Patient has been in the hospital since 10-28.  PRN albuterol.  On Lovenox for DVT prophylaxis.  Day 4 of antibiotics. She will need 7 to 10 days total of antibiotics.   2-PNA; IV Levaquin day 4.  Blood culture no growth to date.   3-Right closed, displace Tibial plateau fracture; per Ortho.  Patient complaining of calf pain, ortho order Doppler.   4-Fever; probably related to PNA. Urine growing Gram negative Rods.   5-UTI: Urine culture growing klebsiella PNA. Sensitive to Levaquin.  Continue with Levaquin. Need to remove foley catheter post op.    Procedures:  none  Antibiotics:  Levaquin 10-30  HPI/Subjective: No dyspnea. No chest pain.   Objective: Filed Vitals:   12/26/13 0551  BP: 127/55  Pulse: 98  Temp: 98 F (36.7 C)  Resp: 16    Intake/Output Summary (Last 24 hours) at 12/26/13 0930 Last data filed at 12/26/13 0553  Gross per 24 hour  Intake    720 ml  Output   3050 ml  Net  -2330 ml   Filed Weights   12/22/13 0730  Weight: 84.369 kg (186 lb)    Exam:   General:  No distress.   Cardiovascular: S 1, S 2 RRR  Respiratory: CTA  Abdomen: Bs present, soft, nt  Musculoskeletal: no edema.   Data Reviewed: Basic Metabolic Panel:  Recent Labs Lab 12/21/13 1928 12/22/13 0425 12/23/13 0540 12/24/13 0500  NA 138 135* 140 144  K 3.9 4.4 3.8 3.8  CL 98 101 102 104  CO2 21 20 27 29   GLUCOSE 100* 132* 144* 105*  BUN 17 11 6  5*  CREATININE 0.83 0.74 0.71 0.69  CALCIUM 9.9 9.1 8.7 8.8   Liver Function Tests:  Recent Labs Lab 12/21/13 1928  AST 51*  ALT 35  ALKPHOS 118*   BILITOT 0.7  PROT 7.9  ALBUMIN 4.7   No results for input(s): LIPASE, AMYLASE in the last 168 hours. No results for input(s): AMMONIA in the last 168 hours. CBC:  Recent Labs Lab 12/21/13 1928 12/22/13 0425 12/23/13 1436 12/25/13 0529  WBC 11.7* 8.2 10.2 6.5  HGB 13.9 12.3 11.1* 11.0*  HCT 40.1 36.3 33.6* 33.6*  MCV 89.5 90.8 92.8 92.3  PLT 315 313 239 229   Cardiac Enzymes: No results for input(s): CKTOTAL, CKMB, CKMBINDEX, TROPONINI in the last 168 hours. BNP (last 3 results) No results for input(s): PROBNP in the last 8760 hours. CBG:  Recent Labs Lab 12/23/13 0740  GLUCAP 115*    Recent Results (from the past 240 hour(s))  Surgical PCR screen     Status: None   Collection Time: 12/21/13 11:59 PM  Result Value Ref Range Status   MRSA, PCR NEGATIVE NEGATIVE Final   Staphylococcus aureus NEGATIVE NEGATIVE Final    Comment:        The Xpert SA Assay (FDA approved for NASAL specimens in patients over 76 years of age), is one component of a comprehensive surveillance program.  Test performance has been validated by [employer] for patients greater than or equal to 25 year old. It is not intended to diagnose infection nor to guide or monitor treatment.  Culture,  blood (routine x 2)     Status: None (Preliminary result)   Collection Time: 12/23/13 12:59 PM  Result Value Ref Range Status   Specimen Description BLOOD LEFT ARM  Final   Special Requests BOTTLES DRAWN AEROBIC AND ANAEROBIC 10CC  Final   Culture  Setup Time   Final    12/23/2013 18:07 Performed at [facility]   Culture   Final           BLOOD CULTURE RECEIVED NO GROWTH TO DATE CULTURE WILL BE HELD FOR 5 DAYS BEFORE ISSUING A FINAL NEGATIVE REPORT Performed at [facility]   Report Status PENDING  Incomplete  Culture, blood (routine x 2)     Status: None (Preliminary result)   Collection Time: 12/23/13  1:06 PM  Result Value Ref Range Status   Specimen Description BLOOD LEFT  HAND  Final   Special Requests BOTTLES DRAWN AEROBIC ONLY 2CC  Final   Culture  Setup Time   Final    12/23/2013 18:10 Performed at [facility]   Culture   Final           BLOOD CULTURE RECEIVED NO GROWTH TO DATE CULTURE WILL BE HELD FOR 5 DAYS BEFORE ISSUING A FINAL NEGATIVE REPORT Performed at [facility]   Report Status PENDING  Incomplete  Urine culture     Status: None   Collection Time: 12/23/13  1:49 PM  Result Value Ref Range Status   Specimen Description URINE, CLEAN CATCH  Final   Special Requests NONE  Final   Culture  Setup Time   Final    12/24/2013 00:21 Performed at [place]   Final    >=100,000 COLONIES/ML Performed at [facility]    Culture   Final    KLEBSIELLA PNEUMONIAE Performed at [facility]    Report Status 12/26/2013 FINAL  Final   Organism ID, Bacteria KLEBSIELLA PNEUMONIAE  Final      Susceptibility   Klebsiella pneumoniae - MIC*    AMPICILLIN >=32 RESISTANT Resistant     CEFAZOLIN <=4 SENSITIVE Sensitive     CEFTRIAXONE <=1 SENSITIVE Sensitive     CIPROFLOXACIN <=0.25 SENSITIVE Sensitive     GENTAMICIN <=1 SENSITIVE Sensitive     LEVOFLOXACIN <=0.12 SENSITIVE Sensitive     NITROFURANTOIN 64 INTERMEDIATE Intermediate     TOBRAMYCIN <=1 SENSITIVE Sensitive     TRIMETH/SULFA <=20 SENSITIVE Sensitive     PIP/TAZO <=4 SENSITIVE Sensitive     * KLEBSIELLA PNEUMONIAE     Studies: No results found.  Scheduled Meds: . docusate sodium  100 mg Oral BID  . DULoxetine  30 mg Oral Daily  . esomeprazole  40 mg Oral Q1200  . ezetimibe  10 mg Oral QHS  . levofloxacin (LEVAQUIN) IV  750 mg Intravenous Q1200  . loratadine  10 mg Oral Daily  . simvastatin  10 mg Oral QHS  . traZODone  50 mg Oral QHS   Continuous Infusions: . sodium chloride 50 mL/hr at 12/24/13 1620    Active Problems:   Tibial plateau fracture   Fever   Hospital acquired PNA   UTI (urinary tract  infection)    Time spent: 25 minutes.     Niel Hummer A  [facility] Pager [phone]. If 7PM-7AM, please contact night-coverage at [email or web address], password [facility] 12/26/2013, 9:30 AM  LOS: 5 days

## 2013-12-26 NOTE — Brief Op Note (Signed)
12/21/2013 - 12/26/2013  6:35 PM  PATIENT:  [name]  68 y.o. female  PRE-OPERATIVE DIAGNOSIS:  right tibial plateau fx  POST-OPERATIVE DIAGNOSIS:  right tibial plateau fx  PROCEDURE:  Procedure(s): OPEN REDUCTION INTERNAL FIXATION (ORIF) TIBIAL PLATEAU (Right)  SURGEON:  Surgeon(s) and Role:    * [name], MD - Primary  PHYSICIAN ASSISTANT:   ASSISTANTS: Arlee Muslim, PA-C   ANESTHESIA:   general  EBL:  Total I/O In: 1000 [I.V.:1000] Out: 1200 [Urine:1200]  DRAINS: none   LOCAL MEDICATIONS USED:  NONE  COUNTS:  YES  TOURNIQUET:  46 minutes @ 300 mm Hg  DICTATION: .Other Dictation: Dictation Number [id number]  PLAN OF CARE: Admit to inpatient   PATIENT DISPOSITION:  PACU - hemodynamically stable.

## 2013-12-26 NOTE — Anesthesia Preprocedure Evaluation (Addendum)
Anesthesia Evaluation  Patient identified by MRN, date of birth, ID band Patient awake    Reviewed: Allergy & Precautions, H&P , NPO status , Patient's Chart, lab work & pertinent test results  History of Anesthesia Complications Negative for: history of anesthetic complications  Airway Mallampati: II  TM Distance: >3 FB Neck ROM: Full    Dental  (+) Teeth Intact   Pulmonary neg pulmonary ROS,  breath sounds clear to auscultation        Cardiovascular hypertension, Rhythm:Regular     Neuro/Psych  Headaches, PSYCHIATRIC DISORDERS Anxiety  Neuromuscular disease    GI/Hepatic hiatal hernia, GERD-  Medicated and Controlled,  Endo/Other  Morbid obesity  Renal/GU      Musculoskeletal  (+) Arthritis -, Right tibial plateau   Abdominal   Peds  Hematology  (+) anemia ,   Anesthesia Other Findings   Reproductive/Obstetrics                             Anesthesia Physical  Anesthesia Plan  ASA: II  Anesthesia Plan: General   Post-op Pain Management:    Induction: Intravenous  Airway Management Planned: LMA  Additional Equipment: None  Intra-op Plan:   Post-operative Plan: Extubation in OR  Informed Consent: I have reviewed the patients History and Physical, chart, labs and discussed the procedure including the risks, benefits and alternatives for the proposed anesthesia with the patient or authorized representative who has indicated his/her understanding and acceptance.   Dental advisory given  Plan Discussed with: CRNA and Surgeon  Anesthesia Plan Comments:         Anesthesia Quick Evaluation  

## 2013-12-27 LAB — BASIC METABOLIC PANEL
Anion gap: 14 (ref 5–15)
BUN: 8 mg/dL (ref 6–23)
CHLORIDE: 99 meq/L (ref 96–112)
CO2: 26 mEq/L (ref 19–32)
CREATININE: 0.6 mg/dL (ref 0.50–1.10)
Calcium: 9.6 mg/dL (ref 8.4–10.5)
GFR calc Af Amer: >90 mL/min (ref >90)
GFR calc non Af Amer: >90 mL/min (ref >90)
GLUCOSE: 215 mg/dL — AB (ref 70–99)
Potassium: 4 mEq/L (ref 3.7–5.3)
Sodium: 139 mEq/L (ref 137–147)

## 2013-12-27 LAB — CBC
HCT: 35.5 % — ABNORMAL LOW (ref 36.0–46.0)
HEMOGLOBIN: 11.8 g/dL — AB (ref 12.0–15.0)
MCH: 30.4 pg (ref 26.0–34.0)
MCHC: 33.2 g/dL (ref 30.0–36.0)
MCV: 91.5 fL (ref 78.0–100.0)
Platelets: 320 10*3/uL (ref 150–400)
RBC: 3.88 MIL/uL (ref 3.87–5.11)
RDW: 12.8 % (ref 11.5–15.5)
WBC: 8.5 10*3/uL (ref 4.0–10.5)

## 2013-12-27 MED ORDER — FLUCONAZOLE 100 MG PO TABS
100.0000 mg | ORAL_TABLET | Freq: Every day | ORAL | Status: DC
  Administered 2013-12-27 – 2013-12-28 (×2): 100 mg via ORAL
  Filled 2013-12-27 (×2): qty 1

## 2013-12-27 MED ORDER — LEVOFLOXACIN 750 MG PO TABS
750.0000 mg | ORAL_TABLET | Freq: Every day | ORAL | Status: DC
  Administered 2013-12-28: 750 mg via ORAL
  Filled 2013-12-27: qty 1

## 2013-12-27 NOTE — Op Note (Signed)
NAMERAINBOW, SALMAN NO.:  [id number]  MEDICAL RECORD NO.:  [id number]  LOCATION:  1884                         FACILITY:  [facility]  PHYSICIAN:  Gaynelle Arabian, M.D.    DATE OF BIRTH:  07/21/1945  DATE OF PROCEDURE:  12/26/2013 DATE OF DISCHARGE:                              OPERATIVE REPORT   PREOPERATIVE DIAGNOSIS:  Right comminuted depressed lateral tibial plateau fracture.  POSTOPERATIVE DIAGNOSIS:  Right comminuted depressed lateral tibial plateau fracture.  PROCEDURE:  Open reduction and internal fixation of right tibial plateau fracture.  SURGEON:  Gaynelle Arabian, M.D.  ASSISTANT:  Alexzandrew L. Perkins, P.A.C.  ANESTHESIA:  General.  ESTIMATED BLOOD LOSS:  Minimal.  DRAINS:  None.  TOURNIQUET TIME:  46 minutes at 300 mmHg.  COMPLICATIONS:  None.  CONDITION:  Stable to recovery.  BRIEF CLINICAL NOTE:  Ms. Langseth is a 68 year old female, who had a fall at home several days ago sustaining a right tibial plateau fracture.  She developed nosocomial pneumonia and has been treated for that.  She is felt to be stable for surgical treatment and presents now for open reduction and internal fixation of this fracture.  PROCEDURE IN DETAIL:  After successful administration of general anesthetic, a tourniquet was placed high on her right thigh and right lower extremity, prepped and draped in usual sterile fashion.  Extremity was wrapped in Esmarch, tourniquet inflated to 300 mmHg.  Midline incision was made with a 10-blade through subcutaneous tissue and then at the tibial tubercle, coursed the incision out laterally along the joint line towards the fibular head.  The skin was cut with a 10-blade for subcutaneous tissue and then subperiosteal flap was made to review of the lateral tibial plateau.  There was a condylar fracture, which had some displacement, but also had a depressed fragment.  I elevated up the depressed fragment with a Soil scientist.   I then reduced the lateral condylar piece back to the main portion of the tibia, which had near anatomic reduction.  We used the Biomet locking plate and placed it along the appropriate position on the lateral tibial plateau.  I held it in position with two K-wires.  We then placed two transcondylar screws through the top two rows of the plate and then also placed a cortical screw distally through the plate.  This had excellent reduction and really compressed the fracture down nicely.  I ended up placing two more cortical screws distally in the plate and three other locking screws through the plate.  I drilled the hole through the air where the fracture was and injected calcium stearate into the fracture to hopefully allow for more rapid healing.  The wound was copiously irrigated with saline solution and then the periosteum was reattached with interrupted #1 Vicryl sutures.  Tourniquet was released total time of 46 minutes.  Subcu was closed with interrupted 2-0 Vicryl and subcuticular running 4-0 Monocryl.  Incision was cleaned and dried and Steri-Strips and a bulky sterile dressing applied.  She was placed into a knee immobilizer, awakened and transported to recovery in stable condition.  Note, that a surgical assistant was a medical necessity for this procedure in order to hold the  fracture reduced while the hardware was applied and also to help positioning of the limb to make it feasible to effectively reduce this fracture.     Gaynelle Arabian, M.D.     FA/MEDQ  D:  12/26/2013  T:  12/27/2013  Job:  [id number]

## 2013-12-27 NOTE — Progress Notes (Signed)
Physical Therapy Treatment Patient Details Name: SHEVAUN LOVAN MRN: [id number] DOB: 1945-10-04 Today's Date: 12/27/2013    History of Present Illness s/p R tibial plateau fracture 12/21/13, orif 12/26/13.    PT Comments    Patient's family states ability to assist patient into house with WC, bumping up backwards. Patient  Will benefit from  Pleak.  Follow Up Recommendations  Home health PT;Supervision/Assistance - 24 hour     Equipment Recommendations  Wheelchair (measurements PT)    Recommendations for Other Services       Precautions / Restrictions Precautions Precautions: Fall Precaution Comments: OK for gentle R knee ROm Required Braces or Orthoses: Knee Immobilizer - Right Knee Immobilizer - Right: On at all times Restrictions Weight Bearing Restrictions: Yes RLE Weight Bearing: Touchdown weight bearing Other Position/Activity Restrictions: KI     Mobility  Bed Mobility Overal bed mobility: Needs Assistance Bed Mobility: Sit to Supine     Supine to sit: Min assist Sit to supine: Min assist   General bed mobility comments: support of r leg  Transfers Overall transfer level: Needs assistance Equipment used: Rolling walker (2 wheeled) Transfers: Sit to/from [employer] Sit to Stand: Mod assist Stand pivot transfers: +2 safety/equipment;Mod assist Squat pivot transfers: Mod assist     General transfer comment: cues for TDWB, hand placement and safety.  Extra time and mod A to transition from sit to standing upright, attempted again  with RW but patient unable to HOP on L foot, resorted to squat pivot from recliner to WC, then WC to bed, multimodal cues for WC set up, safety transfers.  Ambulation/Gait                 Hotel manager mobility: Yes Wheelchair propulsion: Both upper extremities Wheelchair parts: Supervision/cueing Distance: 100' Education administrator  Details (indicate cue type and reason): cues to maneuvre around in room, attmpted into bathroom but too tight, cues for safety with WC set up.  Modified Rankin (Stroke Patients Only)       Balance Overall balance assessment: History of Falls;Needs assistance Sitting-balance support: No upper extremity supported;Feet supported Sitting balance-Leahy Scale: Good     Standing balance support: During functional activity;Bilateral upper extremity supported Standing balance-Leahy Scale: Fair Standing balance comment: stand ing at RW, not stepping.                    Cognition Arousal/Alertness: Awake/alert Behavior During Therapy: WFL for tasks assessed/performed Overall Cognitive Status: Within Functional Limits for tasks assessed                      Exercises      General Comments        Pertinent Vitals/Pain Pain Score: 3  Pain Location: R knee Pain Descriptors / Indicators: Aching Pain Intervention(s): Limited activity within patient's tolerance;Premedicated before session;Repositioned    Home Living Family/patient expects to be discharged to:: Private residence Living Arrangements: Spouse/significant other Available Help at Discharge: Family;Available 24 hours/day         Home Equipment: Walker - 2 wheels;Shower seat - built in;Grab bars - tub/shower;Crutches;Bedside commode Additional Comments: window sill by toilet    Prior Function Level of Independence: Independent          PT Goals (current goals can now be found in the care plan section) Acute Rehab PT Goals Patient Stated Goal: to go home, get  around PT Goal Formulation: With patient Time For Goal Achievement: 01/03/14 Potential to Achieve Goals: Good Additional Goals Additional Goal #1: manage setup and propel WC x 50' w/ supervision Progress towards PT goals: Progressing toward goals    Frequency  7X/week    PT Plan Current plan remains appropriate    Co-evaluation   Reason  for Co-Treatment: For patient/therapist safety PT goals addressed during session: Mobility/safety with mobility OT goals addressed during session: ADL's and self-care     End of Session   Activity Tolerance: Patient tolerated treatment well Patient left: in bed;with call bell/phone within reach;with family/visitor present     Time: 2449-7530 PT Time Calculation (min): 27 min  Charges:  $Therapeutic Activity: 38-52 mins $Wheel Chair Management: 23-37 mins                    G Codes:      Claretha Cooper 12/27/2013, 3:37 PM

## 2013-12-27 NOTE — Evaluation (Signed)
Physical Therapy Evaluation Patient Details Name: [name] MRN: [id number] DOB: 1945/09/25 Today's Date: 12/27/2013   History of Present Illness  s/p R tibial plateau fracture 12/21/13, orif 12/26/13.  Clinical Impression  Patient tolerated  Transfer to recliner, having difficulty  Hopping on L ankle due to previous surgery. Patient will need to be pivot transfer and WC mobility at present. Discussed at length  Obstacles  Of steps at home and recommendation for ramp(temporary or family to build). Patient reports family can provide care. Declines  SNF rehab. Patient will benfit from PT to address problems listed in note below.  Follow Up Recommendations Home health PT;Supervision/Assistance - 24 hour    Equipment Recommendations  Wheelchair (measurements PT) (with R elevating leg rest)    Recommendations for Other Services       Precautions / Restrictions Precautions Precautions: Fall Required Braces or Orthoses: Knee Immobilizer - Right Knee Immobilizer - Right: On at all times (OK for gentle  R knee ROM)      Mobility  Bed Mobility Overal bed mobility: Needs Assistance Bed Mobility: Supine to Sit     Supine to sit: Min assist     General bed mobility comments: support of r leg  Transfers Overall transfer level: Modified independent Equipment used: Rolling walker (2 wheeled) Transfers: Sit to/from [employer] Sit to Stand: Mod assist;+2 safety/equipment;From elevated surface Stand pivot transfers: +2 safety/equipment;Mod assist       General transfer comment: cues for  TDWB, attempted hopping on L foot but  not able  due to previous surgery and discomfort. Patient reports significant pressure on L ankle,  Ambulation/Gait                Stairs            Wheelchair Mobility    Modified Rankin (Stroke Patients Only)       Balance Overall balance assessment: History of Falls;Needs assistance Sitting-balance support: No upper  extremity supported;Feet supported Sitting balance-Leahy Scale: Good     Standing balance support: During functional activity;Bilateral upper extremity supported Standing balance-Leahy Scale: Fair Standing balance comment: stand ing at RW, not stepping.                             Pertinent Vitals/Pain Pain Score: 4  Pain Location: R knee Pain Descriptors / Indicators: Aching;Sharp Pain Intervention(s): Monitored during session;Premedicated before session;Repositioned;RN gave pain meds during session    [place] expects to be discharged to:: Private residence Living Arrangements:  (ex spouse) Available Help at Discharge: Family;Available 24 hours/day Type of Home: House Home Access: Stairs to enter Entrance Stairs-Rails: None Entrance Stairs-Number of Steps: 2-3 Home Layout: Two level;Able to live on main level with bedroom/bathroom Home Equipment: Gilford Rile - 2 wheels;Shower seat - built in;Grab bars - tub/shower;Crutches;Bedside commode      Prior Function Level of Independence: Independent               Hand Dominance        Extremity/Trunk Assessment               Lower Extremity Assessment: RLE deficits/detail;LLE deficits/detail RLE Deficits / Details: in KI, moves ankle well LLE Deficits / Details: decreased ankle ROM from previous surgery.  Cervical / Trunk Assessment: Normal  Communication   Communication: No difficulties  Cognition Arousal/Alertness: Awake/alert Behavior During Therapy: WFL for tasks assessed/performed Overall Cognitive Status: Within Functional Limits for tasks assessed  General Comments      Exercises        Assessment/Plan    PT Assessment Patient needs continued PT services  PT Diagnosis Difficulty walking;Acute pain   PT Problem List Decreased strength;Decreased activity tolerance;Decreased mobility;Decreased knowledge of use of DME;Decreased safety  awareness;Decreased knowledge of precautions;Pain  PT Treatment Interventions DME instruction;Functional mobility training;Therapeutic activities;Therapeutic exercise;Patient/family education   PT Goals (Current goals can be found in the Care Plan section) Acute Rehab PT Goals Patient Stated Goal: to go home, get around PT Goal Formulation: With patient Time For Goal Achievement: 01/03/14 Potential to Achieve Goals: Good    Frequency 7X/week   Barriers to discharge        Co-evaluation               End of Session   Activity Tolerance: Patient tolerated treatment well Patient left: in chair;with call bell/phone within reach Nurse Communication: Mobility status         Time: 6203-5597 PT Time Calculation (min): 40 min   Charges:   PT Evaluation $Initial PT Evaluation Tier I: 1 Procedure PT Treatments $Therapeutic Activity: 38-52 mins   PT G Codes:          Claretha Cooper 12/27/2013, 12:02 PM Tresa Endo PT [phone]

## 2013-12-27 NOTE — Evaluation (Signed)
Occupational Therapy Evaluation Patient Details Name: [name] MRN: [id number] DOB: [birth year] Today's Date: 12/27/2013    History of Present Illness s/p R tibial plateau fracture 12/21/13, orif 12/26/13.   Clinical Impression   This 68 year old female was admitted for the above surgery.  She will benefit from skilled OT to increase safety and independence with adls/toilet transfers following TDWB on R.  Goals are for min A for toileting, and toilet transfers.    Follow Up Recommendations  Home health OT;Supervision/Assistance - 24 hour    Equipment Recommendations  None recommended by OT    Recommendations for Other Services       Precautions / Restrictions Precautions Precautions: Fall Restrictions Weight Bearing Restrictions: Yes RLE Weight Bearing: Touchdown weight bearing Other Position/Activity Restrictions: KI       Mobility Bed Mobility              Transfers Overall transfer level: Modified independent Equipment used: Rolling walker (2 wheeled) Transfers: Sit to/from Stand Sit to Stand: Mod assist Stand pivot transfers:Mod assist (modified SPT)       General transfer comment: cues for TDWB, hand placement and safety.  Extra time and mod A to transition from sit to standing upright    Balance        Standing balance support: During functional activity;Bilateral upper extremity supported Standing balance-Leahy Scale: Fair Standing balance comment: standing at RW, not stepping.                            ADL Overall ADL's : Needs assistance/impaired             Lower Body Bathing: Moderate assistance;Sit to/from stand       Lower Body Dressing: Moderate assistance;Sit to/from stand   Toilet Transfer: Minimal assistance;Moderate assistance;BSC (modified SPT)   Toileting- Clothing Manipulation and Hygiene: Moderate assistance;Sit to/from stand         General ADL Comments: pt can complete UB adls with set up.   She needs mod A to stand for adls and needs both hands to hold to walker:  cues were given for hand placement with sit to stand.  Pt rolled into bathroom with w/c.  She has a window ledge next to high commode.  This will not give her enough support.  Recommended that she place 3:1 commode over toilet.  Pt does have a reacher, explained ADL uses and crossing LLE under R to clear for pants.  Pt crossed L under R in w/c to support RLE     Vision                     Perception     Praxis      Pertinent Vitals/Pain Pain Score: 3  Pain Location: R knee Pain Descriptors / Indicators: Aching Pain Intervention(s): Limited activity within patient's tolerance;Monitored during session     Hand Dominance     Extremity/Trunk Assessment Upper Extremity Assessment Upper Extremity Assessment: Overall WFL for tasks assessed      Cervical / Trunk Assessment Cervical / Trunk Assessment: Normal   Communication     Cognition Arousal/Alertness: Awake/alert Behavior During Therapy: WFL for tasks assessed/performed Overall Cognitive Status: Within Functional Limits for tasks assessed                     General Comments       Exercises       Shoulder  Instructions      Home Living Family/patient expects to be discharged to:: Private residence Living Arrangements: Spouse/significant other Available Help at Discharge: Family;Available 24 hours/day                   Bathroom Toilet: Handicapped height     Home Equipment: Walker - 2 wheels;Shower seat - built in;Grab bars - tub/shower;Crutches;Bedside commode   Additional Comments: window sill by toilet      Prior Functioning/Environment Level of Independence: Independent             OT Diagnosis: Generalized weakness   OT Problem List: Decreased strength;Decreased activity tolerance;Impaired balance (sitting and/or standing);Decreased knowledge of use of DME or AE;Pain   OT Treatment/Interventions:  Self-care/ADL training;DME and/or AE instruction;Patient/family education;Balance training;Therapeutic activities    OT Goals(Current goals can be found in the care plan section) Acute Rehab OT Goals Patient Stated Goal: to go home, get around OT Goal Formulation: With patient Time For Goal Achievement: 01/03/14 Potential to Achieve Goals: Good ADL Goals Pt Will Transfer to Toilet: with min assist;bedside commode (modified SPT) Pt Will Perform Toileting - Clothing Manipulation and hygiene: with min assist;sitting/lateral leans;sit to/from stand  OT Frequency: Min 2X/week   Barriers to D/C:            Co-evaluation PT/OT/SLP Co-Evaluation/Treatment: Yes Reason for Co-Treatment: For patient/therapist safety PT goals addressed during session: Mobility/safety with mobility OT goals addressed during session: ADL's and self-care      End of Session    Activity Tolerance: Patient tolerated treatment well Patient left:  (w/c with PT)   Time: 3343-5686 OT Time Calculation (min): 10 min Charges:  OT General Charges $OT Visit: 1 Procedure OT Evaluation $Initial OT Evaluation Tier I: 1 Procedure G-Codes:    Reinhardt Licausi 2013/12/30, 2:15 PM Lesle Chris, OTR/L [phone] 12-30-13

## 2013-12-27 NOTE — Progress Notes (Signed)
[facility] is providing the following services: Commode and Wheelchair  If patient discharges after hours, please call [phone].   Linward Headland 12/27/2013, 2:17 PM

## 2013-12-27 NOTE — Progress Notes (Signed)
  12/27/2013 1 Day Post-Op Procedure(s) (LRB): OPEN REDUCTION INTERNAL FIXATION (ORIF) TIBIAL PLATEAU (Right) Patient reports pain as mild.   Patient seen in rounds with Dr. Wynelle Link. She is already feel ing better today. Patient is well, but has had some minor complaints of pain in the knee, requiring pain medications They will be Toe Touch - TDWB to the right. Plan is to go Home after hospital stay.  Vital signs in last 24 hours: Temp:  [97.9 F (36.6 C)-98.5 F (36.9 C)] 98.2 F (36.8 C) (11/03 0225) Pulse Rate:  [82-100] 99 (11/03 0225) Resp:  [14-17] 14 (11/03 0225) BP: (120-166)/(50-75) 146/65 mmHg (11/03 0225) SpO2:  [95 %-99 %] 96 % (11/03 0225)  I&O's: I/O last 3 completed shifts: In: 4719.2 [P.O.:1340; I.V.:3379.2] Out: 7490 [Urine:7290; Blood:200]    Labs:  Recent Labs  12/25/13 0529  HGB 11.0*    Recent Labs  12/25/13 0529  WBC 6.5  RBC 3.64*  HCT 33.6*  PLT 229    Recent Labs  12/27/13 0420  NA 139  K 4.0  CL 99  CO2 26  BUN 8  CREATININE 0.60  GLUCOSE 215*  CALCIUM 9.6   No results for input(s): LABPT, INR in the last 72 hours.   EXAM: General - Patient is Alert and Appropriate Extremity - Neurovascular intact Sensation intact distally Dorsiflexion/Plantar flexion intact Dressing - dressing C/D/I Motor Function - intact, moving foot and toes well on exam.    Past Medical History  Diagnosis Date  . Unspecified gastritis and gastroduodenitis without mention of hemorrhage   . Allergic rhinitis, cause unspecified   . Pure hypercholesterolemia   . Unspecified essential hypertension   . Esophageal reflux   . Abdominal pain, epigastric   . Arthritis   . Esophageal stenosis   . Anxiety   . Hiatal hernia   . Chronic headaches     Assessment/Plan: 1 Day Post-Op Procedure(s) (LRB): OPEN REDUCTION INTERNAL FIXATION (ORIF) TIBIAL PLATEAU (Right) Active Problems:   Tibial plateau fracture   Fever   Hospital acquired PNA   UTI  (urinary tract infection)  Estimated body mass index is 31.91 kg/(m^2) as calculated from the following:   Height as of this encounter: 5\' 4"  (1.626 m).   Weight as of this encounter: 84.369 kg (186 lb). Up with therapy Plan for discharge tomorrow Discharge home with home health  DVT Prophylaxis - Lovenox They will be Toe Touch - TDWB to the right.  May begin gentle passive ROM to knee  Day 5 of Antibioitics - Note states 7-10 days total of antibiotics.  Hopefully will be able to change over to oral medications at time of discharge - possibly home tomorrow if doing well.  Arlee Muslim, PA-C Orthopaedic Surgery 12/27/2013, 8:14 AM

## 2013-12-27 NOTE — Progress Notes (Signed)
Physical Therapy Treatment Patient Details Name: [name] MRN: [id number] DOB: [birth year] Today's Date: 12/27/2013    History of Present Illness s/p R tibial plateau fracture 12/21/13, orif 12/26/13.    PT Comments    Patient practicing WC mobility.  Cues for safety, setup.  Follow Up Recommendations  Home health PT;Supervision/Assistance - 24 hour     Equipment Recommendations  Wheelchair (measurements PT)    Recommendations for Other Services       Precautions / Restrictions Precautions Precautions: Fall Precaution Comments: OK for gentle R knee ROm Required Braces or Orthoses: Knee Immobilizer - Right Knee Immobilizer - Right: On at all times Restrictions Weight Bearing Restrictions: Yes RLE Weight Bearing: Touchdown weight bearing Other Position/Activity Restrictions: KI     Mobility  Bed Mobility Overal bed mobility: Needs Assistance Bed Mobility: Sit to Supine     Supine to sit: Min assist Sit to supine: Min assist   General bed mobility comments: support of r leg  Transfers Overall transfer level: Needs assistance Equipment used: Rolling walker (2 wheeled) Transfers: Sit to/from [employer] Sit to Stand: Mod assist Stand pivot transfers: +2 safety/equipment;Mod assist Squat pivot transfers: Mod assist     General transfer comment: cues for TDWB, hand placement and safety.  Extra time and mod A to transition from sit to standing upright, attempted again  with RW but patient unable to HOP on L foot, resorted to squat pivot from recliner to WC, then WC to bed, multimodal cues for WC set up, safety transfers.  Ambulation/Gait                 Hotel manager mobility: Yes Wheelchair propulsion: Both upper extremities Wheelchair parts: Supervision/cueing Distance: 100' Education administrator Details (indicate cue type and reason): cues to maneuvre around in room,  attmpted into bathroom but too tight, cues for safety with WC set up.  Modified Rankin (Stroke Patients Only)       Balance Overall balance assessment: History of Falls;Needs assistance Sitting-balance support: No upper extremity supported;Feet supported Sitting balance-Leahy Scale: Good     Standing balance support: During functional activity;Bilateral upper extremity supported Standing balance-Leahy Scale: Fair Standing balance comment: stand ing at RW, not stepping.                    Cognition Arousal/Alertness: Awake/alert Behavior During Therapy: WFL for tasks assessed/performed Overall Cognitive Status: Within Functional Limits for tasks assessed                      Exercises      General Comments        Pertinent Vitals/Pain Pain Score: 3  Pain Location: R knee Pain Descriptors / Indicators: Aching Pain Intervention(s): Limited activity within patient's tolerance;Premedicated before session;Repositioned    Home Living Family/patient expects to be discharged to:: Private residence Living Arrangements: Spouse/significant other Available Help at Discharge: Family;Available 24 hours/day         Home Equipment: Walker - 2 wheels;Shower seat - built in;Grab bars - tub/shower;Crutches;Bedside commode Additional Comments: window sill by toilet    Prior Function Level of Independence: Independent          PT Goals (current goals can now be found in the care plan section) Acute Rehab PT Goals Patient Stated Goal: to go home, get around PT Goal Formulation: With patient Time For Goal Achievement: 01/03/14 Potential  to Achieve Goals: Good Additional Goals Additional Goal #1: manage setup and propel WC x 50' w/ supervision Progress towards PT goals: Progressing toward goals    Frequency  7X/week    PT Plan Current plan remains appropriate    Co-evaluation   Reason for Co-Treatment: For patient/therapist safety PT goals addressed during  session: Mobility/safety with mobility OT goals addressed during session: ADL's and self-care     End of Session   Activity Tolerance: Patient tolerated treatment well Patient left: in bed;with call bell/phone within reach;with family/visitor present     Time: 1638-4536 PT Time Calculation (min): 27 min  Charges:  $Therapeutic Activity: 38-52 mins $Wheel Chair Management: 23-37 mins                    G Codes:      Claretha Cooper 12/27/2013, 3:39 PM

## 2013-12-27 NOTE — Progress Notes (Signed)
OT Cancellation Note  Patient Details Name: [name] MRN: [id number] DOB: 1945/09/05   Cancelled Treatment:    Reason Eval/Treat Not Completed: Other (comment) Attempted to see pt this am but her IV had started leaking and nursing in to address this. Pt also requesting to wait till pm and practice wheelchair into the bathroom to use toilet.   Jules Schick  [phone] 12/27/2013, 12:36 PM

## 2013-12-27 NOTE — Progress Notes (Signed)
[facility] PROGRESS NOTE  Cassandra ELICKER TMA:[id number] DOB: [birth year] DOA: 12/21/2013 PCP: PROVIDER NOT IN SYSTEM  Assessment/Plan: 1-Acute Hypoxic Respiratory failure. Resolved.  Chest x ray with Subtle interstitial accentuation in the lungs could reflect early atypical pneumonia or drug reaction.  Continue with  Levaquin to cover for Nosocomial PNA. Patient has been in the hospital since 10-28.  PRN albuterol.  On Lovenox for DVT prophylaxis.  Day 5 of antibiotics. She will need 10 days total of antibiotics.  Change Levaquin to oral starting 11-04.   2-PNA; Levaquin day 5.  Blood culture no growth to date. Need total 10 days antibiotics.   3-Right closed, displace Tibial plateau fracture; per Ortho.  Patient complaining of calf pain, ortho order Doppler.   4-Fever; probably related to PNA. Urine growing Gram negative Rods. Will add diflucan for vaginal candidiasis.   5-UTI: Urine culture growing klebsiella PNA. Sensitive to Levaquin.  Continue with Levaquin. Need to remove foley catheter post op.   Ok to discharge tomorrow if stable.   Procedures:  none  Antibiotics:  Levaquin 10-30  HPI/Subjective: No dyspnea. No chest pain. Feeling better. Willing to go home tomorrow.   Objective: Filed Vitals:   12/27/13 1000  BP: 128/53  Pulse: 94  Temp: 98.6 F (37 C)  Resp: 15    Intake/Output Summary (Last 24 hours) at 12/27/13 1336 Last data filed at 12/27/13 0837  Gross per 24 hour  Intake 4719.17 ml  Output   5840 ml  Net -1120.83 ml   Filed Weights   12/22/13 0730  Weight: 84.369 kg (186 lb)    Exam:   General:  No distress.   Cardiovascular: S 1, S 2 RRR  Respiratory: CTA  Abdomen: Bs present, soft, nt  Musculoskeletal: no edema.   Data Reviewed: Basic Metabolic Panel:  Recent Labs Lab 12/21/13 1928 12/22/13 0425 12/23/13 0540 12/24/13 0500 12/27/13 0420  NA 138 135* 140 144 139  K 3.9 4.4 3.8 3.8 4.0  CL 98 101 102 104 99   CO2 21 20 27 29 26   GLUCOSE 100* 132* 144* 105* 215*  BUN 17 11 6  5* 8  CREATININE 0.83 0.74 0.71 0.69 0.60  CALCIUM 9.9 9.1 8.7 8.8 9.6   Liver Function Tests:  Recent Labs Lab 12/21/13 1928  AST 51*  ALT 35  ALKPHOS 118*  BILITOT 0.7  PROT 7.9  ALBUMIN 4.7   No results for input(s): LIPASE, AMYLASE in the last 168 hours. No results for input(s): AMMONIA in the last 168 hours. CBC:  Recent Labs Lab 12/21/13 1928 12/22/13 0425 12/23/13 1436 12/25/13 0529 12/27/13 0500  WBC 11.7* 8.2 10.2 6.5 8.5  HGB 13.9 12.3 11.1* 11.0* 11.8*  HCT 40.1 36.3 33.6* 33.6* 35.5*  MCV 89.5 90.8 92.8 92.3 91.5  PLT 315 313 239 229 320   Cardiac Enzymes: No results for input(s): CKTOTAL, CKMB, CKMBINDEX, TROPONINI in the last 168 hours. BNP (last 3 results) No results for input(s): PROBNP in the last 8760 hours. CBG:  Recent Labs Lab 12/23/13 0740  GLUCAP 115*    Recent Results (from the past 240 hour(s))  Surgical PCR screen     Status: None   Collection Time: 12/21/13 11:59 PM  Result Value Ref Range Status   MRSA, PCR NEGATIVE NEGATIVE Final   Staphylococcus aureus NEGATIVE NEGATIVE Final    Comment:        The Xpert SA Assay (FDA approved for NASAL specimens in patients over 19 years of age),  is one component of a comprehensive surveillance program.  Test performance has been validated by [facility] for patients greater than or equal to 59 year old. It is not intended to diagnose infection nor to guide or monitor treatment.  Culture, blood (routine x 2)     Status: None (Preliminary result)   Collection Time: 12/23/13 12:59 PM  Result Value Ref Range Status   Specimen Description BLOOD LEFT ARM  Final   Special Requests BOTTLES DRAWN AEROBIC AND ANAEROBIC 10CC  Final   Culture  Setup Time   Final    12/23/2013 18:07 Performed at [facility]   Culture   Final           BLOOD CULTURE RECEIVED NO GROWTH TO DATE CULTURE WILL BE HELD FOR 5 DAYS BEFORE  ISSUING A FINAL NEGATIVE REPORT Performed at [facility]   Report Status PENDING  Incomplete  Culture, blood (routine x 2)     Status: None (Preliminary result)   Collection Time: 12/23/13  1:06 PM  Result Value Ref Range Status   Specimen Description BLOOD LEFT HAND  Final   Special Requests BOTTLES DRAWN AEROBIC ONLY 2CC  Final   Culture  Setup Time   Final    12/23/2013 18:10 Performed at [facility]   Culture   Final           BLOOD CULTURE RECEIVED NO GROWTH TO DATE CULTURE WILL BE HELD FOR 5 DAYS BEFORE ISSUING A FINAL NEGATIVE REPORT Performed at [facility]   Report Status PENDING  Incomplete  Urine culture     Status: None   Collection Time: 12/23/13  1:49 PM  Result Value Ref Range Status   Specimen Description URINE, CLEAN CATCH  Final   Special Requests NONE  Final   Culture  Setup Time   Final    12/24/2013 00:21 Performed at [place]   Final    >=100,000 COLONIES/ML Performed at [facility]    Culture   Final    KLEBSIELLA PNEUMONIAE Performed at [facility]    Report Status 12/26/2013 FINAL  Final   Organism ID, Bacteria KLEBSIELLA PNEUMONIAE  Final      Susceptibility   Klebsiella pneumoniae - MIC*    AMPICILLIN >=32 RESISTANT Resistant     CEFAZOLIN <=4 SENSITIVE Sensitive     CEFTRIAXONE <=1 SENSITIVE Sensitive     CIPROFLOXACIN <=0.25 SENSITIVE Sensitive     GENTAMICIN <=1 SENSITIVE Sensitive     LEVOFLOXACIN <=0.12 SENSITIVE Sensitive     NITROFURANTOIN 64 INTERMEDIATE Intermediate     TOBRAMYCIN <=1 SENSITIVE Sensitive     TRIMETH/SULFA <=20 SENSITIVE Sensitive     PIP/TAZO <=4 SENSITIVE Sensitive     * KLEBSIELLA PNEUMONIAE     Studies: Dg C-arm 1-60 Min-no Report  12/26/2013   CLINICAL DATA:  ORIF of a proximal right tibia fracture.  EXAM: DG C-ARM 1-60 MIN - NRPT [facility]; RIGHT KNEE - 3 VIEW  COMPARISON:  CT, 12/21/2013.  FINDINGS: Spot fluoro graphic image shows a lateral  fixation plate and multiple screws reducing the lateral tibial plateau fracture. Fracture fragments appear well aligned. There is no new fracture or evidence of an operative complication.  IMPRESSION: Operative imaging during ORIF of a right lateral tibial plateau fracture   Electronically Signed   By: Lajean Manes M.D.   On: 12/26/2013 19:38   Dg Knee 2 Views Right  12/26/2013  CLINICAL DATA:  ORIF of a proximal right tibia fracture.  EXAM: DG C-ARM 1-60 MIN - NRPT [facility]; RIGHT KNEE - 3 VIEW  COMPARISON:  CT, 12/21/2013.  FINDINGS: Spot fluoro graphic image shows a lateral fixation plate and multiple screws reducing the lateral tibial plateau fracture. Fracture fragments appear well aligned. There is no new fracture or evidence of an operative complication.  IMPRESSION: Operative imaging during ORIF of a right lateral tibial plateau fracture   Electronically Signed   By: Lajean Manes M.D.   On: 12/26/2013 19:38    Scheduled Meds: . docusate sodium  100 mg Oral BID  . DULoxetine  30 mg Oral Daily  . enoxaparin (LOVENOX) injection  40 mg Subcutaneous Q24H  . esomeprazole  40 mg Oral Q1200  . ezetimibe  10 mg Oral QHS  . fluconazole  100 mg Oral Daily  . levofloxacin (LEVAQUIN) IV  750 mg Intravenous Q1200  . loratadine  10 mg Oral Daily  . simvastatin  10 mg Oral QHS  . traZODone  50 mg Oral QHS   Continuous Infusions: . lactated ringers 125 mL/hr at 12/27/13 1311    Active Problems:   Tibial plateau fracture   Fever   Hospital acquired PNA   UTI (urinary tract infection)    Time spent: 25 minutes.     Niel Hummer A  [facility] Pager [phone]. If 7PM-7AM, please contact night-coverage at [email or web address], password [facility] 12/27/2013, 1:36 PM  LOS: 6 days

## 2013-12-27 NOTE — Plan of Care (Signed)
Problem: Phase I Progression Outcomes Goal: Voiding-avoid urinary catheter unless indicated Outcome: Completed/Met Date Met:  12/27/13

## 2013-12-27 NOTE — Care Management Note (Signed)
    Page 1 of 2   12/27/2013     2:27:03 PM CARE MANAGEMENT NOTE 12/27/2013  Patient:  [name], [name]   Account Number:  [id number]  Date Initiated:  12/25/2013  Documentation initiated by:  [facility]  Subjective/Objective Assessment:   orif tibial plateau fracture     Action/Plan:   waiting final recommendation   Anticipated DC Date:  12/29/2013   Anticipated DC Plan:  [place]  CM consult      PAC Choice  NA   Choice offered to / List presented to:     DME arranged  3-N-1  [place]      DME agency  [place] arranged  [place] - 11 Patient Refused      Status of service:  Completed, signed off Medicare Important Message given?  YES (If response is "NO", the following Medicare IM given date fields will be blank) Date Medicare IM given:  12/25/2013 Medicare IM given by:  [facility] Date Additional Medicare IM given:   Additional Medicare IM given by:    Discharge Disposition:  HOME/SELF CARE  Per UR Regulation:    If discussed at Long Length of Stay Meetings, dates discussed:    Comments:  12/27/13 14:00 CM met with pt in room who states she does not want any HH services.  We discussed the HHPT/OT eval and the recc. of having both HHPT/OT but pt refuses as she staes she is NWB and will wait until after the next surgery to rehab.  Pt will accept the recc wheelchair and 3n1 and CM called DME rep, Pura Spice who will deliver both to room prior to discharge.  No other CM needs were communicated. Mariane Masters, BSN, [place].  12/25/2013 1130 NCM spoke to pt and states she has walker at home. She needs wheels for walker. [facility] supplied pt with wheels for her walker. Ex-husband will bring her walker to ensure wheels work. She has bedside commode at home. Agreeable to [facility] for [facility] if needed at dc. Jonnie Finner RN CCM Case Mgmt phone [phone]

## 2013-12-27 NOTE — Anesthesia Postprocedure Evaluation (Signed)
  Anesthesia Post-op Note  Patient: [name]  Procedure(s) Performed: Procedure(s): OPEN REDUCTION INTERNAL FIXATION (ORIF) TIBIAL PLATEAU (Right)  Patient Location: PACU  Anesthesia Type:General  Level of Consciousness: awake  Airway and Oxygen Therapy: Patient Spontanous Breathing and Patient connected to nasal cannula oxygen  Post-op Pain: moderate  Post-op Assessment: Post-op Vital signs reviewed, Patient's Cardiovascular Status Stable, Respiratory Function Stable, Patent Airway, No signs of Nausea or vomiting and Pain level controlled  Post-op Vital Signs: Reviewed and stable  Last Vitals:  Filed Vitals:   12/27/13 0225  BP: 146/65  Pulse: 99  Temp: 36.8 C  Resp: 14    Complications: No apparent anesthesia complications

## 2013-12-28 DIAGNOSIS — S82141D Displaced bicondylar fracture of right tibia, subsequent encounter for closed fracture with routine healing: Secondary | ICD-10-CM

## 2013-12-28 MED ORDER — METHOCARBAMOL 500 MG PO TABS: 500.0000 mg | ORAL_TABLET | Freq: Four times a day (QID) | ORAL | Status: DC | PRN

## 2013-12-28 MED ORDER — FLUCONAZOLE 100 MG PO TABS: 100.0000 mg | ORAL_TABLET | Freq: Every day | ORAL | Status: DC

## 2013-12-28 MED ORDER — LEVOFLOXACIN 750 MG PO TABS: 750.0000 mg | ORAL_TABLET | Freq: Every day | ORAL | Status: DC

## 2013-12-28 MED ORDER — OXYCODONE HCL 5 MG PO TABS: 5.0000 mg | ORAL_TABLET | ORAL | Status: DC | PRN

## 2013-12-28 NOTE — Discharge Summary (Signed)
Physician Discharge Summary   Patient ID: [name] MRN: [id number] DOB/AGE: 10-25-1945 68 y.o.  Admit date: 12/21/2013 Discharge date: 12/28/2013  Primary Diagnosis:  Right comminuted depressed lateral tibial plateau fracture.  Admission Diagnoses:  Past Medical History  Diagnosis Date  . Unspecified gastritis and gastroduodenitis without mention of hemorrhage   . Allergic rhinitis, cause unspecified   . Pure hypercholesterolemia   . Unspecified essential hypertension   . Esophageal reflux   . Abdominal pain, epigastric   . Arthritis   . Esophageal stenosis   . Anxiety   . Hiatal hernia   . Chronic headaches    Discharge Diagnoses:   Active Problems:   Tibial plateau fracture   Fever   Hospital acquired PNA   UTI (urinary tract infection)  Estimated body mass index is 31.91 kg/(m^2) as calculated from the following:   Height as of this encounter: 5' 4"  (1.626 m).   Weight as of this encounter: 84.369 kg (186 lb).  Procedure:  Procedure(s) (LRB): OPEN REDUCTION INTERNAL FIXATION (ORIF) TIBIAL PLATEAU (Right)   Consults: Medicine Consult  HPI: Patient states that she tripped earlier today landing on right knee. Unable to weightbear after. Denies other injuries or LOC. Known hx of right knee djd and is followed by Dr Wynelle Link for this and is sched for right TKR 30 Nov. xrays and CT scan showed a bicondylar tibial plateau fracture.  Laboratory Data: Admission on 12/21/2013, Discharged on 12/28/2013  Component Date Value Ref Range Status  . Sodium 12/21/2013 138  137 - 147 mEq/L Final  . Potassium 12/21/2013 3.9  3.7 - 5.3 mEq/L Final  . Chloride 12/21/2013 98  96 - 112 mEq/L Final  . CO2 12/21/2013 21  19 - 32 mEq/L Final  . Glucose, Bld 12/21/2013 100* 70 - 99 mg/dL Final  . BUN 12/21/2013 17  6 - 23 mg/dL Final  . Creatinine, Ser 12/21/2013 0.83  0.50 - 1.10 mg/dL Final  . Calcium 12/21/2013 9.9  8.4 - 10.5 mg/dL Final  . Total Protein 12/21/2013 7.9  6.0  - 8.3 g/dL Final  . Albumin 12/21/2013 4.7  3.5 - 5.2 g/dL Final  . AST 12/21/2013 51* 0 - 37 U/L Final  . ALT 12/21/2013 35  0 - 35 U/L Final  . Alkaline Phosphatase 12/21/2013 118* 39 - 117 U/L Final  . Total Bilirubin 12/21/2013 0.7  0.3 - 1.2 mg/dL Final  . GFR calc non Af Amer 12/21/2013 71* >90 mL/min Final  . GFR calc Af Amer 12/21/2013 83* >90 mL/min Final   Comment: (NOTE)                          The eGFR has been calculated using the CKD EPI equation.                          This calculation has not been validated in all clinical situations.                          eGFR's persistently <90 mL/min signify possible Chronic Kidney                          Disease.  . Anion gap 12/21/2013 19* 5 - 15 Final  . WBC 12/21/2013 11.7* 4.0 - 10.5 K/uL Final  . RBC 12/21/2013 4.48  3.87 - 5.11 MIL/uL  Final  . Hemoglobin 12/21/2013 13.9  12.0 - 15.0 g/dL Final  . HCT 12/21/2013 40.1  36.0 - 46.0 % Final  . MCV 12/21/2013 89.5  78.0 - 100.0 fL Final  . MCH 12/21/2013 31.0  26.0 - 34.0 pg Final  . MCHC 12/21/2013 34.7  30.0 - 36.0 g/dL Final  . RDW 12/21/2013 12.8  11.5 - 15.5 % Final  . Platelets 12/21/2013 315  150 - 400 K/uL Final  . aPTT 12/21/2013 69* 24 - 37 seconds Final   Comment:                                 IF BASELINE aPTT IS ELEVATED,                          SUGGEST PATIENT RISK ASSESSMENT                          BE USED TO DETERMINE APPROPRIATE                          ANTICOAGULANT THERAPY.  . Prothrombin Time 12/21/2013 13.9  11.6 - 15.2 seconds Final  . INR 12/21/2013 1.06  0.00 - 1.49 Final  . Color, Urine 12/22/2013 YELLOW  YELLOW Final  . APPearance 12/22/2013 CLEAR  CLEAR Final  . Specific Gravity, Urine 12/22/2013 1.015  1.005 - 1.030 Final  . pH 12/22/2013 5.5  5.0 - 8.0 Final  . Glucose, UA 12/22/2013 NEGATIVE  NEGATIVE mg/dL Final  . Hgb urine dipstick 12/22/2013 NEGATIVE  NEGATIVE Final  . Bilirubin Urine 12/22/2013 NEGATIVE  NEGATIVE Final  . Ketones,  ur 12/22/2013 NEGATIVE  NEGATIVE mg/dL Final  . Protein, ur 12/22/2013 NEGATIVE  NEGATIVE mg/dL Final  . Urobilinogen, UA 12/22/2013 0.2  0.0 - 1.0 mg/dL Final  . Nitrite 12/22/2013 NEGATIVE  NEGATIVE Final  . Leukocytes, UA 12/22/2013 NEGATIVE  NEGATIVE Final   MICROSCOPIC NOT DONE ON URINES WITH NEGATIVE PROTEIN, BLOOD, LEUKOCYTES, NITRITE, OR GLUCOSE <1000 mg/dL.  . WBC 12/22/2013 8.2  4.0 - 10.5 K/uL Final  . RBC 12/22/2013 4.00  3.87 - 5.11 MIL/uL Final  . Hemoglobin 12/22/2013 12.3  12.0 - 15.0 g/dL Final  . HCT 12/22/2013 36.3  36.0 - 46.0 % Final  . MCV 12/22/2013 90.8  78.0 - 100.0 fL Final  . MCH 12/22/2013 30.8  26.0 - 34.0 pg Final  . MCHC 12/22/2013 33.9  30.0 - 36.0 g/dL Final  . RDW 12/22/2013 12.9  11.5 - 15.5 % Final  . Platelets 12/22/2013 313  150 - 400 K/uL Final  . Sodium 12/22/2013 135* 137 - 147 mEq/L Final  . Potassium 12/22/2013 4.4  3.7 - 5.3 mEq/L Final   Comment: MODERATE HEMOLYSIS                          HEMOLYSIS AT THIS LEVEL MAY AFFECT RESULT  . Chloride 12/22/2013 101  96 - 112 mEq/L Final  . CO2 12/22/2013 20  19 - 32 mEq/L Final  . Glucose, Bld 12/22/2013 132* 70 - 99 mg/dL Final  . BUN 12/22/2013 11  6 - 23 mg/dL Final  . Creatinine, Ser 12/22/2013 0.74  0.50 - 1.10 mg/dL Final  . Calcium 12/22/2013 9.1  8.4 - 10.5 mg/dL Final  . GFR calc non  Af Amer 12/22/2013 86* >90 mL/min Final  . GFR calc Af Amer 12/22/2013 >90  >90 mL/min Final   Comment: (NOTE)                          The eGFR has been calculated using the CKD EPI equation.                          This calculation has not been validated in all clinical situations.                          eGFR's persistently <90 mL/min signify possible Chronic Kidney                          Disease.  . Anion gap 12/22/2013 14  5 - 15 Final  . MRSA, PCR 12/21/2013 NEGATIVE  NEGATIVE Final  . Staphylococcus aureus 12/21/2013 NEGATIVE  NEGATIVE Final   Comment:                                 The Xpert  SA Assay (FDA                          approved for NASAL specimens                          in patients over 37 years of age),                          is one component of                          a comprehensive surveillance                          program.  Test performance has                          been validated by [employer] for patients greater                          than or equal to 52 year old.                          It is not intended                          to diagnose infection nor to                          guide or monitor treatment.  . Sodium 12/23/2013 140  137 - 147 mEq/L Final  . Potassium 12/23/2013 3.8  3.7 - 5.3 mEq/L Final  . Chloride 12/23/2013 102  96 - 112 mEq/L Final  . CO2 12/23/2013 27  19 - 32 mEq/L Final  . Glucose, Bld 12/23/2013 144* 70 -  99 mg/dL Final  . BUN 12/23/2013 6  6 - 23 mg/dL Final  . Creatinine, Ser 12/23/2013 0.71  0.50 - 1.10 mg/dL Final  . Calcium 12/23/2013 8.7  8.4 - 10.5 mg/dL Final  . GFR calc non Af Amer 12/23/2013 87* >90 mL/min Final  . GFR calc Af Amer 12/23/2013 >90  >90 mL/min Final   Comment: (NOTE)                          The eGFR has been calculated using the CKD EPI equation.                          This calculation has not been validated in all clinical situations.                          eGFR's persistently <90 mL/min signify possible Chronic Kidney                          Disease.  . Anion gap 12/23/2013 11  5 - 15 Final  . Glucose-Capillary 12/23/2013 115* 70 - 99 mg/dL Final  . Specimen Description 12/23/2013 BLOOD LEFT HAND   Final  . Special Requests 12/23/2013 BOTTLES DRAWN AEROBIC ONLY [place]   Final  . Culture  Setup Time 12/23/2013    Final                   Value:12/23/2013 18:10                         Performed at [facility]  . Culture 12/23/2013    Final                   Value:       BLOOD CULTURE RECEIVED NO GROWTH TO DATE CULTURE WILL BE HELD FOR 5 DAYS  BEFORE ISSUING A FINAL NEGATIVE REPORT                         Performed at [facility]  . Report Status 12/23/2013 PENDING   Incomplete  . Specimen Description 12/23/2013 BLOOD LEFT ARM   Final  . Special Requests 12/23/2013 BOTTLES DRAWN AEROBIC AND ANAEROBIC 10CC   Final  . Culture  Setup Time 12/23/2013    Final                   Value:12/23/2013 18:07                         Performed at [facility]  . Culture 12/23/2013    Final                   Value:       BLOOD CULTURE RECEIVED NO GROWTH TO DATE CULTURE WILL BE HELD FOR 5 DAYS BEFORE ISSUING A FINAL NEGATIVE REPORT                         Performed at [facility]  . Report Status 12/23/2013 PENDING   Incomplete  . Specimen Description 12/23/2013 URINE, CLEAN CATCH   Final  . Special Requests 12/23/2013 NONE   Final  . Culture  Setup Time 12/23/2013    Final  Value:12/24/2013 00:21 Performed at [facility]   . Colony Count 12/23/2013    Final                   Value:>=100,000 COLONIES/ML Performed at [facility]   . Culture 12/23/2013    Final                   Value:KLEBSIELLA PNEUMONIAE Performed at [facility]   . Report Status 12/23/2013 12/26/2013 FINAL   Final  . Organism ID, Bacteria 12/23/2013 KLEBSIELLA PNEUMONIAE   Final  . WBC 12/23/2013 10.2  4.0 - 10.5 K/uL Final  . RBC 12/23/2013 3.62* 3.87 - 5.11 MIL/uL Final  . Hemoglobin 12/23/2013 11.1* 12.0 - 15.0 g/dL Final  . HCT 12/23/2013 33.6* 36.0 - 46.0 % Final  . MCV 12/23/2013 92.8  78.0 - 100.0 fL Final  . MCH 12/23/2013 30.7  26.0 - 34.0 pg Final  . MCHC 12/23/2013 33.0  30.0 - 36.0 g/dL Final  . RDW 12/23/2013 12.8  11.5 - 15.5 % Final  . Platelets 12/23/2013 239  150 - 400 K/uL Final   Comment: SPECIMEN CHECKED FOR CLOTS                          DELTA CHECK NOTED  . Sodium 12/24/2013 144  137 - 147 mEq/L Final  . Potassium 12/24/2013 3.8  3.7 - 5.3 mEq/L Final  . Chloride 12/24/2013  104  96 - 112 mEq/L Final  . CO2 12/24/2013 29  19 - 32 mEq/L Final  . Glucose, Bld 12/24/2013 105* 70 - 99 mg/dL Final  . BUN 12/24/2013 5* 6 - 23 mg/dL Final  . Creatinine, Ser 12/24/2013 0.69  0.50 - 1.10 mg/dL Final  . Calcium 12/24/2013 8.8  8.4 - 10.5 mg/dL Final  . GFR calc non Af Amer 12/24/2013 88* >90 mL/min Final  . GFR calc Af Amer 12/24/2013 >90  >90 mL/min Final   Comment: (NOTE)                          The eGFR has been calculated using the CKD EPI equation.                          This calculation has not been validated in all clinical situations.                          eGFR's persistently <90 mL/min signify possible Chronic Kidney                          Disease.  . Anion gap 12/24/2013 11  5 - 15 Final  . WBC 12/25/2013 6.5  4.0 - 10.5 K/uL Final  . RBC 12/25/2013 3.64* 3.87 - 5.11 MIL/uL Final  . Hemoglobin 12/25/2013 11.0* 12.0 - 15.0 g/dL Final  . HCT 12/25/2013 33.6* 36.0 - 46.0 % Final  . MCV 12/25/2013 92.3  78.0 - 100.0 fL Final  . MCH 12/25/2013 30.2  26.0 - 34.0 pg Final  . MCHC 12/25/2013 32.7  30.0 - 36.0 g/dL Final  . RDW 12/25/2013 12.8  11.5 - 15.5 % Final  . Platelets 12/25/2013 229  150 - 400 K/uL Final  . Sodium 12/27/2013 139  137 - 147 mEq/L Final  . Potassium 12/27/2013 4.0  3.7 -  5.3 mEq/L Final  . Chloride 12/27/2013 99  96 - 112 mEq/L Final  . CO2 12/27/2013 26  19 - 32 mEq/L Final  . Glucose, Bld 12/27/2013 215* 70 - 99 mg/dL Final  . BUN 12/27/2013 8  6 - 23 mg/dL Final  . Creatinine, Ser 12/27/2013 0.60  0.50 - 1.10 mg/dL Final  . Calcium 12/27/2013 9.6  8.4 - 10.5 mg/dL Final  . GFR calc non Af Amer 12/27/2013 >90  >90 mL/min Final  . GFR calc Af Amer 12/27/2013 >90  >90 mL/min Final   Comment: (NOTE) The eGFR has been calculated using the CKD EPI equation. This calculation has not been validated in all clinical situations. eGFR's persistently <90 mL/min signify possible Chronic Kidney Disease.   . Anion gap 12/27/2013 14  5 -  15 Final  . WBC 12/27/2013 8.5  4.0 - 10.5 K/uL Final  . RBC 12/27/2013 3.88  3.87 - 5.11 MIL/uL Final  . Hemoglobin 12/27/2013 11.8* 12.0 - 15.0 g/dL Final  . HCT 12/27/2013 35.5* 36.0 - 46.0 % Final  . MCV 12/27/2013 91.5  78.0 - 100.0 fL Final  . MCH 12/27/2013 30.4  26.0 - 34.0 pg Final  . MCHC 12/27/2013 33.2  30.0 - 36.0 g/dL Final  . RDW 12/27/2013 12.8  11.5 - 15.5 % Final  . Platelets 12/27/2013 320  150 - 400 K/uL Final   Comment: REPEATED TO VERIFY DELTA CHECK NOTED      X-Rays:Dg Chest 1 View  12/23/2013   CLINICAL DATA:  Hypoxia.  Wheezing.  Preoperative for knee surgery.  EXAM: CHEST - 1 VIEW  COMPARISON:  01/18/2009; report from 01/02/2011  FINDINGS: Low lung volumes. Elevated right hemidiaphragm. Bibasilar scarring not changed from 2010.  Subtle interstitial accentuation in the lungs without a new airspace opacity.  IMPRESSION: 1. Subtle interstitial accentuation in the lungs could reflect early atypical pneumonia or drug reaction. 2. Bibasilar scarring. 3. Chronically elevated right hemidiaphragm.   Electronically Signed   By: Sherryl Barters M.D.   On: 12/23/2013 09:52   Ct Knee Right Wo Contrast  12/21/2013   CLINICAL DATA:  Tibial plateau fracture. Initial encounter. RIGHT knee swelling.  EXAM: CT OF THE RIGHT knee KNEE WITHOUT CONTRAST  TECHNIQUE: Multidetector CT imaging of the RIGHT knee knee was performed according to the standard protocol. Multiplanar CT image reconstructions were also generated.  COMPARISON:  12/21/2013 radiographs.  FINDINGS: Lipohemarthrosis is present. There is an lateral tibial plateau fracture with central depression measuring 8 mm. Fracture extends into the proximal tibiofibular joint. The distal femur appears intact. 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 Proximal fibula appears intact. Metaphysis intact.  Medial tibial plateau fracture is also present, only well seen on the sagittal images. The  fracture of the medial tibial plateau is nondepressed (image 30 series 7). There is no cortical step-off.  IMPRESSION: Bicondylar tibial plateau fracture with depression of the central lateral tibial plateau and no displacement or depression of the medial tibial plateau fracture. This is compatible with Schatzker V fracture.   Electronically Signed   By: Dereck Ligas M.D.   On: 12/21/2013 16:00   Dg Knee Complete 4 Views Right  12/21/2013   CLINICAL DATA:  Fall. Right knee injury and pain. Initial encounter.  EXAM: RIGHT KNEE - COMPLETE 4+ VIEW  COMPARISON:  None.  FINDINGS: Small to moderate knee joint effusion is seen. Non standard positioning limits evaluation, however there is a mildly depressed fracture involving the lateral tibial plateau and  tibial spines. No evidence of dislocation. Generalized osteopenia noted.  IMPRESSION: Mildly depressed fracture involving the lateral tibial plateau and tibial spines.  Small to moderate knee joint effusion.  Osteopenia.   Electronically Signed   By: Earle Gell M.D.   On: 12/21/2013 14:30   Dg C-arm 1-60 Min-no Report  12/26/2013   CLINICAL DATA:  ORIF of a proximal right tibia fracture.  EXAM: DG C-ARM 1-60 MIN - NRPT [facility]; RIGHT KNEE - 3 VIEW  COMPARISON:  CT, 12/21/2013.  FINDINGS: Spot fluoro graphic image shows a lateral fixation plate and multiple screws reducing the lateral tibial plateau fracture. Fracture fragments appear well aligned. There is no new fracture or evidence of an operative complication.  IMPRESSION: Operative imaging during ORIF of a right lateral tibial plateau fracture   Electronically Signed   By: Lajean Manes M.D.   On: 12/26/2013 19:38   Dg Knee 2 Views Right  12/26/2013   CLINICAL DATA:  ORIF of a proximal right tibia fracture.  EXAM: DG C-ARM 1-60 MIN - NRPT [facility]; RIGHT KNEE - 3 VIEW  COMPARISON:  CT, 12/21/2013.  FINDINGS: Spot fluoro graphic image shows a lateral fixation plate and multiple screws reducing the lateral tibial  plateau fracture. Fracture fragments appear well aligned. There is no new fracture or evidence of an operative complication.  IMPRESSION: Operative imaging during ORIF of a right lateral tibial plateau fracture   Electronically Signed   By: Lajean Manes M.D.   On: 12/26/2013 19:38    EKG: Orders placed or performed during the hospital encounter of 04/11/11  . EKG     Hospital Course: [name] is a 68 y.o. who was admitted to [facility] on 12/21/2013 while Dr. Rolena Infante was on call.  She was originally scheduled for a total knee within a month to be performed by Dr. Wynelle Link. He was notified and consulted to take over care of the patient.    HD 2 - Patient seen in rounds with Dr. Wynelle Link. Patient was currently a bedrest due to the fracture.  She was seen in rounds by Dr. Wynelle Link that morning. She was admitted by Dr. Rolena Infante the previous day following a fall in her garage at home. She had pain and brought to [facility] where x-rays revealed a displaced right tibial plateau fracture. She was placed in a knee immobilizer and placed at bedrest. Obtained a consent or ORIF right tibia. Allow her to eat that day and then NPO after MN tonight. PRN meds ordered. Foley to gravity. IV ANCEF on call to OR tomorrow. Increase fluids at MN when becomes NPO. HD 3 - Plan was for surgery that day. Unable to proceed at lunch.Postponed until that afternoon.  Unfortunately, developed some hypoxia and noted wheezing. Anesthesia suggested CXR and results showed: CHEST - 1 VIEW  COMPARISON: 01/18/2009; report from 01/02/2011  FINDINGS:  Low lung volumes. Elevated right hemidiaphragm. Bibasilar scarring  not changed from 2010.  Subtle interstitial accentuation in the lungs without a new airspace  opacity.  IMPRESSION:  1. Subtle interstitial accentuation in the lungs could reflect early  atypical pneumonia or drug reaction.  2. Bibasilar scarring.  3. Chronically elevated right  hemidiaphragm. Due to findings and the continued need for O2, Medicine consult called. Seen by Medicine. Started on IV ABX for Nosocomial PNA in the form of IV Levaquin.  Continued on Lovenox due to bedrest prior to surgery.  HD 4 - Closed R tibial plateau fracture. NWB RLE; in immobilizer.  Continue pain medications. On lovenox for DVT prophylaxis. Continue IV ABX tx. Plan for ORIF with Dr. Elmyra Ricks on Monday, 11/2. HD 5 - Patient complained of right calf pain. No complaints of chest pain. Sched for orif tibial plateau fracture the next day. Urine culture growing gram negative rods. Continue with Levaquin. STAT venous doppler right LE ordered. Preliminary report: There is no DVT or SVT noted in the right lower extremity.  HD 6 - They were brought to the operating room on 12/26/2013 and underwent Procedure(s): OPEN REDUCTION INTERNAL FIXATION (ORIF) TIBIAL PLATEAU.  Patient tolerated the procedure well and was later transferred to the recovery room and then to the orthopaedic floor for postoperative care.  They were given PO and IV analgesics for pain control following their surgery.  They were continued on  postoperative antibiotics of  Anti-infectives    Start     Dose/Rate Route Frequency Ordered Stop   12/28/13 1000  levofloxacin (LEVAQUIN) tablet 750 mg  Status:  Discontinued     750 mg Oral Daily 12/27/13 1337 12/28/13 1804   12/28/13 0000  levofloxacin (LEVAQUIN) 750 MG tablet     750 mg Oral Daily 12/28/13 1351     12/28/13 0000  fluconazole (DIFLUCAN) 100 MG tablet     100 mg Oral Daily 12/28/13 1351     12/27/13 1300  fluconazole (DIFLUCAN) tablet 100 mg  Status:  Discontinued     100 mg Oral Daily 12/27/13 1232 12/28/13 1804   12/24/13 0730  ceFAZolin (ANCEF) IVPB 2 g/50 mL premix     2 g100 mL/hr over 30 Minutes Intravenous  Once 12/23/13 1127 12/24/13 0820   12/23/13 1330  levofloxacin (LEVAQUIN) IVPB 750 mg  Status:  Discontinued     750 mg100 mL/hr over 90 Minutes Intravenous  Daily 12/23/13 1253 12/27/13 1337   12/22/13 1000  ceFAZolin (ANCEF) IVPB 2 g/50 mL premix  Status:  Discontinued     2 g100 mL/hr over 30 Minutes Intravenous Every 8 hours 12/22/13 0859 12/23/13 1121     and continued on DVT prophylaxis in the form of Lovenox.   PT and OT were ordered for total joint protocol.  Discharge planning consulted to help with postop disposition and equipment needs.  Patient had a good night on the evening of surgery.  She was already feeling better that morning.  They started to get up OOB with therapy on day one. They were placed on Toe Touch - TDWB to the right leg.Therapy was allowed to begin gentle passive ROM to knee. Continued to work with therapy into day two.  Dressing was changed on day two and the incision was healing well.  By day three, she had been switched over to oral antibiotics, she was off oxygen, the patient had progressed with therapy and meeting their goals.  Incision was healing well.  Patient was seen in rounds and was ready to go home.  Discharge home with home health Diet - Cardiac diet Follow up - in 2 weeks on Tuesday 01/10/2014 Activity - Touch Down Weight Bearing Only Disposition - Home Condition Upon Discharge - Stable D/C Meds - See DC Summary DVT Prophylaxis - Aspirin 325 mg daily      Discharge Instructions    Call MD / Call 911    Complete by:  As directed   If you experience chest pain or shortness of breath, CALL 911 and be transported to the hospital emergency room.  If you develope a fever above 101 F,  pus (white drainage) or increased drainage or redness at the wound, or calf pain, call your surgeon's office.     Change dressing    Complete by:  As directed   Change dressing daily with sterile 4 x 4 inch gauze dressing and apply TED hose. Do not submerge the incision under water.     Constipation Prevention    Complete by:  As directed   Drink plenty of fluids.  Prune juice may be helpful.  You may use a stool softener,  such as Colace (over the counter) 100 mg twice a day.  Use MiraLax (over the counter) for constipation as needed.     Diet - low sodium heart healthy    Complete by:  As directed      Discharge instructions    Complete by:  As directed   Pick up stool softner and laxative for home. Do not submerge incision under water. May shower once she arrives home. Continue to use ice for pain and swelling from surgery. Take 325 mg Aspirin daily. May begin Passive ROM to the knee with HHPT. No active motion yet.     Do not sit on low chairs, stoools or toilet seats, as it may be difficult to get up from low surfaces    Complete by:  As directed      Driving restrictions    Complete by:  As directed   No driving until released by the physician.     Increase activity slowly as tolerated    Complete by:  As directed      Lifting restrictions    Complete by:  As directed   No lifting until released by the physician.     Patient may shower    Complete by:  As directed   You may shower without a dressing once there is no drainage.  Do not wash over the wound.  If drainage remains, do not shower until drainage stops.     TED hose    Complete by:  As directed   Use stockings (TED hose) for 3 weeks on both leg(s).  You may remove them at night for sleeping.     Touch down weight bearing    Complete by:  As directed   Laterality:  right  Extremity:  Lower            Medication List    STOP taking these medications        ALPRAZolam 0.25 MG tablet  Commonly known as:  XANAX     ibuprofen 200 MG tablet  Commonly known as:  ADVIL,MOTRIN     traZODone 50 MG tablet  Commonly known as:  DESYREL     vitamin C 500 MG tablet  Commonly known as:  ASCORBIC ACID      TAKE these medications        aspirin 325 MG tablet  Take 325 mg by mouth daily.     DULoxetine 30 MG capsule  Commonly known as:  CYMBALTA  Take 30 mg by mouth daily.     esomeprazole 40 MG packet  Commonly known as:  NEXIUM   Take 40 mg by mouth daily before breakfast.     ezetimibe-simvastatin 10-10 MG per tablet  Commonly known as:  VYTORIN  Take 1 tablet by mouth at bedtime.     fluconazole 100 MG tablet  Commonly known as:  DIFLUCAN  Take 1 tablet (100 mg total) by mouth daily.  levofloxacin 750 MG tablet  Commonly known as:  LEVAQUIN  Take 1 tablet (750 mg total) by mouth daily.     loratadine 10 MG tablet  Commonly known as:  CLARITIN  Take 10 mg by mouth daily.     methocarbamol 500 MG tablet  Commonly known as:  ROBAXIN  Take 1 tablet (500 mg total) by mouth every 6 (six) hours as needed for muscle spasms.     oxyCODONE 5 MG immediate release tablet  Commonly known as:  Oxy IR/ROXICODONE  Take 1-4 tablets (5-20 mg total) by mouth every 4 (four) hours as needed for moderate pain or severe pain.       Follow-up Information    Follow up with [name], MD. Schedule an appointment as soon as possible for a visit on 01/10/2014.   Specialty:  Orthopedic Surgery   Why:  Call office at [phone] to set up appointment on Tuesday 01/10/2014.   Contact information:   [address] [place] [address] [phone]       Signed: Arlee Muslim, PA-C Orthopaedic Surgery 12/29/2013, 10:17 AM

## 2013-12-28 NOTE — Progress Notes (Signed)
  12/28/2013 2 Days Post-Op Procedure(s) (LRB): OPEN REDUCTION INTERNAL FIXATION (ORIF) TIBIAL PLATEAU (Right) Patient reports pain as mild.   Patient seen in rounds with Dr. Wynelle Link. Patient is doing okay this morning. They will be TDWB to the right and lower. Plan is to go Home after hospital stay.  She is now off oxygen.  Hopefully will change over to an oral ABX today.  Vital signs in last 24 hours: Temp:  [98.6 F (37 C)-99 F (37.2 C)] 98.9 F (37.2 C) (11/04 0615) Pulse Rate:  [88-98] 88 (11/04 0615) Resp:  [15-16] 16 (11/04 0615) BP: (128-159)/(47-58) 130/51 mmHg (11/04 0615) SpO2:  [92 %-97 %] 97 % (11/04 0615)  I&O's: I/O last 3 completed shifts: In: 5860.2 [P.O.:1580; I.V.:4280.2] Out: 4590 [Urine:4590]    Labs:  Recent Labs  12/27/13 0500  HGB 11.8*    Recent Labs  12/27/13 0500  WBC 8.5  RBC 3.88  HCT 35.5*  PLT 320    Recent Labs  12/27/13 0420  NA 139  K 4.0  CL 99  CO2 26  BUN 8  CREATININE 0.60  GLUCOSE 215*  CALCIUM 9.6   No results for input(s): LABPT, INR in the last 72 hours.   EXAM: General - Patient is Alert and Appropriate Extremity - Neurovascular intact Sensation intact distally Dressing - dressing C/D/I Motor Function - intact, moving foot and toes well on exam.   Past Medical History  Diagnosis Date  . Unspecified gastritis and gastroduodenitis without mention of hemorrhage   . Allergic rhinitis, cause unspecified   . Pure hypercholesterolemia   . Unspecified essential hypertension   . Esophageal reflux   . Abdominal pain, epigastric   . Arthritis   . Esophageal stenosis   . Anxiety   . Hiatal hernia   . Chronic headaches     Assessment/Plan: 2 Days Post-Op Procedure(s) (LRB): OPEN REDUCTION INTERNAL FIXATION (ORIF) TIBIAL PLATEAU (Right) Active Problems:   Tibial plateau fracture   Fever   Hospital acquired PNA   UTI (urinary tract infection)  Estimated body mass index is 31.91 kg/(m^2) as calculated  from the following:   Height as of this encounter: 5\' 4"  (1.626 m).   Weight as of this encounter: 84.369 kg (186 lb). Up with therapy Discharge home with home health  DVT Prophylaxis - Aspirin 325 mg Daily They will be Toe Touch - TDWB to the right. May begin gentle passive ROM to knee Day 6 of Antibiotics - Note states 7-10 days total of antibiotics. Hopefully will be able to change over to oral medications at time of discharge   Arlee Muslim, PA-C Orthopaedic Surgery 12/28/2013, 7:25 AM

## 2013-12-28 NOTE — Plan of Care (Signed)
Problem: Phase I Progression Outcomes Goal: Initial discharge plan identified Outcome: Completed/Met Date Met:  12/28/13  Problem: Phase II Progression Outcomes Goal: Post op CMS Neurovascular WDL Outcome: Completed/Met Date Met:  12/28/13 Goal: Tolerating diet Outcome: Completed/Met Date Met:  12/28/13 Goal: Discharge plan established Outcome: Completed/Met Date Met:  12/28/13

## 2013-12-28 NOTE — Progress Notes (Signed)
Physical Therapy Treatment Patient Details Name: [name] MRN: [id number] DOB: 1945-07-06 Today's Date: 12/28/2013    History of Present Illness s/p R tibial plateau fracture 12/21/13, orif 12/26/13.    PT Comments    Patient is highly motivated this session to regain independence for bed mobility and transfers.  Patient and spouse were given demonstration and verbal education of TE's, functional mobility, and WC parts and mobility including going up stairs.  Patient rated pain as 10/10 during TE's.  Patient is compliant of TDWB status.    Follow Up Recommendations        Equipment Recommendations  Wheelchair (measurements PT)    Recommendations for Other Services       Precautions / Restrictions Precautions Precautions: Fall Precaution Comments: OK for gentle R knee ROm Required Braces or Orthoses: Knee Immobilizer - Right Knee Immobilizer - Right: On at all times Restrictions Weight Bearing Restrictions: Yes RLE Weight Bearing: Touchdown weight bearing Other Position/Activity Restrictions: KI     Mobility  Bed Mobility Overal bed mobility: Needs Assistance Bed Mobility: Supine to Sit     Supine to sit: Min assist     General bed mobility comments: spouse was educated and gave A for pt  Transfers Overall transfer level: Needs assistance   Transfers: Technical brewer Transfers;Sit to/from Stand Sit to Stand: Min assist Stand pivot transfers: Min assist       General transfer comment: pt compliant with TDWB; spouse educated and gave A to patient for transfer from [facility] to [facility]  Ambulation/Gait                 Stairs    instructed/demonstrated spouse and pt how to bump wheelchair backward up on steps.        Information systems manager mobility: Yes Wheelchair propulsion: Both upper extremities Wheelchair parts: Supervision/cueing Distance: 100 Wheelchair Assistance Details (indicate cue type and reason): VCs for WC  parts management; VCs and demo for WC goins up stairs backwards x1; spouse educated  Modified Rankin (Stroke Patients Only)       Balance                                    Cognition Arousal/Alertness: Awake/alert Behavior During Therapy: WFL for tasks assessed/performed Overall Cognitive Status: Within Functional Limits for tasks assessed                      Exercises General Exercises - Lower Extremity Ankle Circles/Pumps: AROM;Right;20 reps;Supine Quad Sets: AROM;Right;10 reps;Supine Heel Slides: PROM;Right;10 reps;Supine Hip ABduction/ADduction: AROM;10 reps;Supine;Right Straight Leg Raises: AAROM;Right;10 reps;Supine    General Comments        Pertinent Vitals/Pain Pain Assessment: 0-10 Pain Score: 10-Worst pain ever Pain Location: R knee Pain Intervention(s): Limited activity within patient's tolerance;Repositioned    Home Living                      Prior Function            PT Goals (current goals can now be found in the care plan section) Acute Rehab PT Goals Patient Stated Goal: to go home, get around Progress towards PT goals: Progressing toward goals    Frequency  7X/week    PT Plan Current plan remains appropriate    Co-evaluation             End of Session Equipment  Utilized During Treatment: Right knee immobilizer Activity Tolerance: Patient tolerated treatment well Patient left: in chair;with call bell/phone within reach;with family/visitor present     Time: 6734-1937 PT Time Calculation (min): 36 min  Charges:                       G Codes:      Miller,Derrick, SPTA 12/28/2013, 1:14 PM   Reviewed above  Rica Koyanagi  PTA [facility]  Acute  Rehab Pager      [phone]

## 2013-12-28 NOTE — Progress Notes (Signed)
   Subjective: 2 Days Post-Op Procedure(s) (LRB): OPEN REDUCTION INTERNAL FIXATION (ORIF) TIBIAL PLATEAU (Right) Patient reports pain as mild and moderate.   Patient seen in rounds with Dr. Wynelle Link. Patient is well, and has had no acute complaints or problems Patient is ready to go home today  Objective: Vital signs in last 24 hours: Temp:  [98.6 F (37 C)-99 F (37.2 C)] 98.9 F (37.2 C) (11/04 0615) Pulse Rate:  [88-98] 88 (11/04 0615) Resp:  [15-16] 16 (11/04 0615) BP: (130-159)/(47-58) 130/51 mmHg (11/04 0615) SpO2:  [92 %-97 %] 97 % (11/04 0615)  Intake/Output from previous day:  Intake/Output Summary (Last 24 hours) at 12/28/13 1342 Last data filed at 12/28/13 0853  Gross per 24 hour  Intake   2141 ml  Output   1000 ml  Net   1141 ml    Intake/Output this shift: Total I/O In: 240 [P.O.:240] Out: 350 [Urine:350]  Labs:  Recent Labs  12/27/13 0500  HGB 11.8*    Recent Labs  12/27/13 0500  WBC 8.5  RBC 3.88  HCT 35.5*  PLT 320    Recent Labs  12/27/13 0420  NA 139  K 4.0  CL 99  CO2 26  BUN 8  CREATININE 0.60  GLUCOSE 215*  CALCIUM 9.6   No results for input(s): LABPT, INR in the last 72 hours.  EXAM: General - Patient is Alert, Appropriate and Oriented Extremity - Neurovascular intact Sensation intact distally Dorsiflexion/Plantar flexion intact Incision - clean, dry, no drainage, healing Motor Function - intact, moving foot and toes well on exam.   Assessment/Plan: 2 Days Post-Op Procedure(s) (LRB): OPEN REDUCTION INTERNAL FIXATION (ORIF) TIBIAL PLATEAU (Right) Procedure(s) (LRB): OPEN REDUCTION INTERNAL FIXATION (ORIF) TIBIAL PLATEAU (Right) Past Medical History  Diagnosis Date  . Unspecified gastritis and gastroduodenitis without mention of hemorrhage   . Allergic rhinitis, cause unspecified   . Pure hypercholesterolemia   . Unspecified essential hypertension   . Esophageal reflux   . Abdominal pain, epigastric   . Arthritis    . Esophageal stenosis   . Anxiety   . Hiatal hernia   . Chronic headaches    Active Problems:   Tibial plateau fracture   Fever   Hospital acquired PNA   UTI (urinary tract infection)  Estimated body mass index is 31.91 kg/(m^2) as calculated from the following:   Height as of this encounter: 5\' 4"  (1.626 m).   Weight as of this encounter: 84.369 kg (186 lb). Up with therapy Discharge home with home health Diet - Cardiac diet Follow up - in 2 weeks on Tuesday 01/10/2014 Activity - Touch Down Weight Bearing Only Disposition - Home Condition Upon Discharge - Stable D/C Meds - See DC Summary DVT Prophylaxis - Aspirin 325 mg daily  Arlee Muslim, PA-C Orthopaedic Surgery 12/28/2013, 1:42 PM

## 2013-12-28 NOTE — Discharge Instructions (Signed)
Tibial Plateau Fracture, Displaced, Adult You have a fracture (break in bone) of your tibial plateau. This is a fracture in the upper part of the large "shin" bone (tibia) in your lower leg. The plateau is the joint surface that buts up against the femur (thigh bone of your upper leg). This is what makes up your knee joint. Displaced means that a portion of the fracture is out of place from where the bone is supposed to be. Because this fracture goes into the knee joint, it is necessary that this fracture be fixed in the best position possible. This fracture must be fixed surgically. This means an operation must be done to get the bones into the best possible position for healing. Otherwise, this fracture can cause severe arthritis and marked disability over the years. This is likely to occur even with the best treatment. These fractures are generally diagnosed with x-rays. Often a CT scan is needed to identify the fracture fragments and the degree of displacement. TREATMENT  Your fracture will be reduced (bones fragments are put back into position) and held in place with hardware (fixation devices). When your caregiver feels the fracture is healed well enough, you may begin range of motion exercises to keep your knee limber (moving well). This may be very difficult at first. It is necessary to follow the instructions of all your caregivers, your surgeon, and physical therapist following surgery. LET YOUR CAREGIVER KNOW ABOUT:  Allergies  Medications taken including herbs, eye drops, over the counter medications, and creams  Use of steroids (by mouth or creams)  History of bleeding or blood problems  Previous problems with anesthetics or novocaine  Possibility of pregnancy, if this applies  History of blood clots (thrombophlebitis)  Previous surgery  Other health problems RISKS AND COMPLICATIONS All surgery is associated with risks. Some of these risks are:  Excessive  bleeding.  Infection.  Failure to heal properly resulting in an unstable knee.  Stiffness of knee following repair.  Need to remove the hardware. BEFORE AND AFTER YOUR PROCEDURE Prior to surgery, an IV (intravenous line connected to your vein for giving fluids) may be started. You will also be given an anesthetic. These are medicines and gas to make you sleep. You may also be given regional anesthesia such as a spinal or epidural anesthetic. After surgery, you will be taken to the recovery area where a nurse will monitor your progress. You may have a catheter (a long, narrow, hollow tube) in your bladder following surgery that helps you pass your water. When you are awake, are stable, taking fluids well, and without complications, you will be returned to your room. You will receive physical therapy and other care until you are doing well and your caregiver feels it is safe for you to be transferred either to home or to an extended care facility. HOME CARE INSTRUCTIONS   You may resume normal diet and activities as directed or allowed.  Keep ice packs (a bag of ice wrapped in a towel) on the surgical area for twenty minutes, four times per day, for the first two days following surgery. Use ice only if OK with your surgeon or caregiver.  Change dressings if necessary or as directed.  If you have a plaster or fiberglass cast:  Do not try to scratch the skin under the cast using sharp or pointed objects.  Check the skin around the cast every day. You may put lotion on any red or sore areas.  Keep your  cast dry and clean.  Do not put pressure on any part of your cast or splint until it is fully hardened.  Your cast or splint can be protected during bathing with a plastic bag. Do not lower the cast or splint into water.  Only take over-the-counter or prescription medicines for pain, discomfort, or fever as directed by your caregiver.  Use crutches as directed and do not exercise leg unless  instructed.  Keep toes and ankles moving frequently if they are not immobilized in your splint or cast  Keep your leg elevated above your heart as much as possible during the first 24-48 hours after your surgery  These are not fractures to be taken lightly! If these bones become displaced and get out of position, it may eventually lead to arthritis and disability for the rest of your life. Problems often follow even the best of care. Follow the directions of your caregiver.  Keep appointments as directed. SEEK IMMEDIATE MEDICAL CARE IF:   There is redness, swelling, or increasing pain in the wound.  There is pus coming from wound.  An unexplained oral temperature above 102 F (38.9 C) develops.  You develop a bad smell coming from the wound or dressing.  There is a breaking open of the wound (edges not staying together) after sutures or staples have been removed.  You develop severe pain in the injured leg.  You begin to lose feeling in your foot or toes. Especially if someone else moving your toes becomes increasingly painful  You develop a cold or blue foot or toes on the injured side. If you do not have a window in your cast for observing the wound, a discharge or minor bleeding may show up as a stain on the outside of your cast or caster splint. Report these findings to your caregiver. Document Released: 11/02/2001 Document Revised: 05/05/2011 Document Reviewed: 05/04/2007 [facility] Patient Information 2015 [place], [place]. This information is not intended to replace advice given to you by your health care provider. Make sure you discuss any questions you have with your health care provider.  Pneumonia, Adult Pneumonia is an infection of the lungs. It may be caused by a germ (virus or bacteria). Some types of pneumonia can spread easily from person to person. This can happen when you cough or sneeze. HOME CARE  Only take medicine as told by your doctor.  Take your medicine  (antibiotics) as told. Finish it even if you start to feel better.  Do not smoke.  You may use a vaporizer or humidifier in your room. This can help loosen thick spit (mucus).  Sleep so you are almost sitting up (semi-upright). This helps reduce coughing.  Rest. A shot (vaccine) can help prevent pneumonia. Shots are often advised for:  People over 23 years old.  Patients on chemotherapy.  People with long-term (chronic) lung problems.  People with immune system problems. GET HELP RIGHT AWAY IF:   You are getting worse.  You cannot control your cough, and you are losing sleep.  You cough up blood.  Your pain gets worse, even with medicine.  You have a fever.  Any of your problems are getting worse, not better.  You have shortness of breath or chest pain. MAKE SURE YOU:   Understand these instructions.  Will watch your condition.  Will get help right away if you are not doing well or get worse. Document Released: 07/30/2007 Document Revised: 05/05/2011 Document Reviewed: 05/03/2010 [facility] Patient Information 2015 [place], [place].  This information is not intended to replace advice given to you by your health care provider. Make sure you discuss any questions you have with your health care provider.  Postoperative Constipation Protocol  Constipation - defined medically as fewer than three stools per week and severe constipation as less than one stool per week.  One of the most common issues patients have following surgery is constipation.  Even if you have a regular bowel pattern at home, your normal regimen is likely to be disrupted due to multiple reasons following surgery.  Combination of anesthesia, postoperative narcotics, change in appetite and fluid intake all can affect your bowels.  In order to avoid complications following surgery, here are some recommendations in order to help you during your recovery period.  Colace (docusate) - Pick up an over-the-counter  form of Colace or another stool softener and take twice a day as long as you are requiring postoperative pain medications.  Take with a full glass of water daily.  If you experience loose stools or diarrhea, hold the colace until you stool forms back up.  If your symptoms do not get better within 1 week or if they get worse, check with your doctor.  Dulcolax (bisacodyl) - Pick up over-the-counter and take as directed by the product packaging as needed to assist with the movement of your bowels.  Take with a full glass of water.  Use this product as needed if not relieved by Colace only.   MiraLax (polyethylene glycol) - Pick up over-the-counter to have on hand.  MiraLax is a solution that will increase the amount of water in your bowels to assist with bowel movements.  Take as directed and can mix with a glass of water, juice, soda, coffee, or tea.  Take if you go more than two days without a movement. Do not use MiraLax more than once per day. Call your doctor if you are still constipated or irregular after using this medication for 7 days in a row.  If you continue to have problems with postoperative constipation, please contact the office for further assistance and recommendations.  If you experience "the worst abdominal pain ever" or develop nausea or vomiting, please contact the office immediatly for further recommendations for treatment.

## 2013-12-28 NOTE — Progress Notes (Signed)
OT Cancellation Note  Patient Details Name: KAHLEY LEIB MRN: [id number] DOB: [birth year]   Cancelled Treatment:    Reason Eval/Treat Not Completed: Other (comment).  Have attempted to see pt twice this am.  She just finished using commode, and this is OT's focus.  She reports that she is comfortable with transfer.    Happy Begeman 12/28/2013, 10:26 AM  Lesle Chris, OTR/L [phone] 12/28/2013

## 2013-12-28 NOTE — Progress Notes (Addendum)
[facility] PROGRESS NOTE  [name] YJE:[id number] DOB: 02-20-1946 DOA: 12/21/2013 PCP: Florina Ou, MD  Brief narrative 68 y/o female with arthritis, HL, GERD admitted with right closed tibial plateau fracture. Pt spiked temperature and went in to hypoxic resp failure due to PNA and hospitalist were consulted for medical management. Pt also had UTI.   Assessment/Plan: Acute hypoxic resp failure possibly due to PNA  on levaquin. Plan for 10 day treatment ( day 6/10). Symptoms resolved. Blood cx negative Bedside spirometry. Prn antitussives. Will need abx until 01/02/2014  Right closed tibial plateau fracture  s/p ORIF on 11/3. Pain control and DVT prophylaxis per prtho. Possible d/c home with HHPT today.  Klebsiella UTI Sensitive to quinolones. Asymptomatic  Vaginal candidiasis On fluconazole. Please discharge her on 8 more days ( until 01/06/2014)  Continue  remaining home meds. Will sign off . Please call for any questions. Thank you.    Code Status: full  Family Communication: none at bedside Disposition Plan: home per ortho ( possibly today    Antibiotics:  levaquin ( day 6/10)  Fluconazole (day 2/10)  HPI/Subjective: Feels better overall. Rt leg pain controlled with pain meds.denies SOB and has minimal cough  Objective: Filed Vitals:   12/28/13 0615  BP: 130/51  Pulse: 88  Temp: 98.9 F (37.2 C)  Resp: 16    Intake/Output Summary (Last 24 hours) at 12/28/13 1114 Last data filed at 12/28/13 0853  Gross per 24 hour  Intake   2381 ml  Output   1000 ml  Net   1381 ml   Filed Weights   12/22/13 0730  Weight: 84.369 kg (186 lb)    Exam:   General:  NAD  HEENT: moist mucosa   chest: clear b/l   CVS: N S1&S2 , No murmurs  ABD: soft, NT, ND  Ext: swelling over  rt tibia, no edema     Data Reviewed: Basic Metabolic Panel:  Recent Labs Lab 12/21/13 1928 12/22/13 0425 12/23/13 0540 12/24/13 0500 12/27/13 0420  NA 138  135* 140 144 139  K 3.9 4.4 3.8 3.8 4.0  CL 98 101 102 104 99  CO2 21 20 27 29 26   GLUCOSE 100* 132* 144* 105* 215*  BUN 17 11 6  5* 8  CREATININE 0.83 0.74 0.71 0.69 0.60  CALCIUM 9.9 9.1 8.7 8.8 9.6   Liver Function Tests:  Recent Labs Lab 12/21/13 1928  AST 51*  ALT 35  ALKPHOS 118*  BILITOT 0.7  PROT 7.9  ALBUMIN 4.7   No results for input(s): LIPASE, AMYLASE in the last 168 hours. No results for input(s): AMMONIA in the last 168 hours. CBC:  Recent Labs Lab 12/21/13 1928 12/22/13 0425 12/23/13 1436 12/25/13 0529 12/27/13 0500  WBC 11.7* 8.2 10.2 6.5 8.5  HGB 13.9 12.3 11.1* 11.0* 11.8*  HCT 40.1 36.3 33.6* 33.6* 35.5*  MCV 89.5 90.8 92.8 92.3 91.5  PLT 315 313 239 229 320   Cardiac Enzymes: No results for input(s): CKTOTAL, CKMB, CKMBINDEX, TROPONINI in the last 168 hours. BNP (last 3 results) No results for input(s): PROBNP in the last 8760 hours. CBG:  Recent Labs Lab 12/23/13 0740  GLUCAP 115*    Recent Results (from the past 240 hour(s))  Surgical PCR screen     Status: None   Collection Time: 12/21/13 11:59 PM  Result Value Ref Range Status   MRSA, PCR NEGATIVE NEGATIVE Final   Staphylococcus aureus NEGATIVE NEGATIVE Final    Comment:  The Xpert SA Assay (FDA approved for NASAL specimens in patients over 49 years of age), is one component of a comprehensive surveillance program.  Test performance has been validated by [employer] for patients greater than or equal to 40 year old. It is not intended to diagnose infection nor to guide or monitor treatment.  Culture, blood (routine x 2)     Status: None (Preliminary result)   Collection Time: 12/23/13 12:59 PM  Result Value Ref Range Status   Specimen Description BLOOD LEFT ARM  Final   Special Requests BOTTLES DRAWN AEROBIC AND ANAEROBIC 10CC  Final   Culture  Setup Time   Final    12/23/2013 18:07 Performed at [facility]   Culture   Final           BLOOD CULTURE  RECEIVED NO GROWTH TO DATE CULTURE WILL BE HELD FOR 5 DAYS BEFORE ISSUING A FINAL NEGATIVE REPORT Performed at [facility]   Report Status PENDING  Incomplete  Culture, blood (routine x 2)     Status: None (Preliminary result)   Collection Time: 12/23/13  1:06 PM  Result Value Ref Range Status   Specimen Description BLOOD LEFT HAND  Final   Special Requests BOTTLES DRAWN AEROBIC ONLY 2CC  Final   Culture  Setup Time   Final    12/23/2013 18:10 Performed at [facility]   Culture   Final           BLOOD CULTURE RECEIVED NO GROWTH TO DATE CULTURE WILL BE HELD FOR 5 DAYS BEFORE ISSUING A FINAL NEGATIVE REPORT Performed at [facility]   Report Status PENDING  Incomplete  Urine culture     Status: None   Collection Time: 12/23/13  1:49 PM  Result Value Ref Range Status   Specimen Description URINE, CLEAN CATCH  Final   Special Requests NONE  Final   Culture  Setup Time   Final    12/24/2013 00:21 Performed at [facility]   Final    >=100,000 COLONIES/ML Performed at [facility]    Culture   Final    KLEBSIELLA PNEUMONIAE Performed at [facility]    Report Status 12/26/2013 FINAL  Final   Organism ID, Bacteria KLEBSIELLA PNEUMONIAE  Final      Susceptibility   Klebsiella pneumoniae - MIC*    AMPICILLIN >=32 RESISTANT Resistant     CEFAZOLIN <=4 SENSITIVE Sensitive     CEFTRIAXONE <=1 SENSITIVE Sensitive     CIPROFLOXACIN <=0.25 SENSITIVE Sensitive     GENTAMICIN <=1 SENSITIVE Sensitive     LEVOFLOXACIN <=0.12 SENSITIVE Sensitive     NITROFURANTOIN 64 INTERMEDIATE Intermediate     TOBRAMYCIN <=1 SENSITIVE Sensitive     TRIMETH/SULFA <=20 SENSITIVE Sensitive     PIP/TAZO <=4 SENSITIVE Sensitive     * KLEBSIELLA PNEUMONIAE     Studies: Dg C-arm 1-60 Min-no Report  12/26/2013   CLINICAL DATA:  ORIF of a proximal right tibia fracture.  EXAM: DG C-ARM 1-60 MIN - NRPT [facility]; RIGHT KNEE - 3 VIEW  COMPARISON:  CT,  12/21/2013.  FINDINGS: Spot fluoro graphic image shows a lateral fixation plate and multiple screws reducing the lateral tibial plateau fracture. Fracture fragments appear well aligned. There is no new fracture or evidence of an operative complication.  IMPRESSION: Operative imaging during ORIF of a right lateral tibial plateau fracture   Electronically Signed   By: Dedra Skeens.D.  On: 12/26/2013 19:38   Dg Knee 2 Views Right  12/26/2013   CLINICAL DATA:  ORIF of a proximal right tibia fracture.  EXAM: DG C-ARM 1-60 MIN - [facility]PT [facility]; RIGHT KNEE - 3 VIEW  COMPARISON:  CT, 12/21/2013.  FINDINGS: Spot fluoro graphic image shows a lateral fixation plate and multiple screws reducing the lateral tibial plateau fracture. Fracture fragments appear well aligned. There is no new fracture or evidence of an operative complication.  IMPRESSION: Operative imaging during ORIF of a right lateral tibial plateau fracture   Electronically Signed   By: Lajean Manes M.D.   On: 12/26/2013 19:38    Scheduled Meds: . docusate sodium  100 mg Oral BID  . DULoxetine  30 mg Oral Daily  . enoxaparin (LOVENOX) injection  40 mg Subcutaneous Q24H  . esomeprazole  40 mg Oral Q1200  . ezetimibe  10 mg Oral QHS  . fluconazole  100 mg Oral Daily  . levofloxacin  750 mg Oral Daily  . loratadine  10 mg Oral Daily  . simvastatin  10 mg Oral QHS  . traZODone  50 mg Oral QHS   Continuous Infusions: . lactated ringers 20 mL/hr at 12/27/13 1932      Time spent: 25 minutes    Louellen Molder  [facility] Pager [phone] If 7PM-7AM, please contact night-coverage at [email or web address], password [facility] 12/28/2013, 11:14 AM  LOS: 7 days

## 2013-12-29 LAB — CULTURE, BLOOD (ROUTINE X 2)
CULTURE: NO GROWTH
Culture: NO GROWTH

## 2013-12-30 ENCOUNTER — Encounter (HOSPITAL_COMMUNITY): Payer: Self-pay | Admitting: Orthopedic Surgery

## 2014-01-23 ENCOUNTER — Inpatient Hospital Stay (HOSPITAL_COMMUNITY): Admission: RE | Admit: 2014-01-23 | Payer: 59 | Source: Ambulatory Visit | Admitting: Orthopedic Surgery

## 2014-01-23 ENCOUNTER — Encounter (HOSPITAL_COMMUNITY): Admission: RE | Payer: Self-pay | Source: Ambulatory Visit

## 2014-01-23 SURGERY — ARTHROPLASTY, KNEE, TOTAL
Anesthesia: Choice | Site: Knee | Laterality: Right

## 2014-03-31 ENCOUNTER — Ambulatory Visit: Payer: Self-pay | Admitting: Orthopedic Surgery

## 2014-03-31 NOTE — Progress Notes (Signed)
Preoperative surgical orders have been place into the Epic hospital system for [facility] on 03/31/2014, 12:29 PM  by Mickel Crow for surgery on 04-19-2014.  Preop Knee  orders including IV Tylenol and IV Decadron as long as there are no contraindications to the above medications. Arlee Muslim, PA-C

## 2014-04-14 NOTE — Patient Instructions (Addendum)
[name]  04/14/2014   Your procedure is scheduled on: 04/19/14   Report to [facility] Main  Entrance and follow signs to               [place] at 3:15 PM.   Call this number if you have problems the morning of surgery [phone]   Remember:  Do not eat food  :After Midnight.             MAY HAVE CLEAR LIQUIDS UNTIL 11:45 AM    CLEAR LIQUID DIET   Foods Allowed                                                                     Foods Excluded  Coffee and tea, regular and decaf                             liquids that you cannot  Plain Jell-O in any flavor                                             see through such as: Fruit ices (not with fruit pulp)                                     milk, soups, orange juice  Iced Popsicles                                                All solid food Carbonated beverages, regular and diet                                    Cranberry, grape and apple juices Sports drinks like Gatorade Lightly seasoned clear broth or consume(fat free) Sugar, honey syrup _____________________________________________________________________       Take these medicines the morning of surgery with A SIP OF WATER: NONE                               You may not have any metal on your body including hair pins and              piercings  Do not wear jewelry, make-up, lotions, powders or perfumes.             Do not wear nail polish.  Do not shave  48 hours prior to surgery.              Men may shave face and neck.   Do not bring valuables to the hospital. [name].  Contacts, dentures  or bridgework may not be worn into surgery.  Leave suitcase in the car. After surgery it may be brought to your room.     Patients discharged the day of surgery will not be allowed to drive home.  Name and phone number of your driver:  Special Instructions: N/A              Please  read over the following fact sheets you were given: _____________________________________________________________________                                                     [name]  Before surgery, you can play an important role.  Because skin is not sterile, your skin needs to be as free of germs as possible.  You can reduce the number of germs on your skin by washing with CHG (chlorahexidine gluconate) soap before surgery.  CHG is an antiseptic cleaner which kills germs and bonds with the skin to continue killing germs even after washing. Please DO NOT use if you have an allergy to CHG or antibacterial soaps.  If your skin becomes reddened/irritated stop using the CHG and inform your nurse when you arrive at Short Stay. Do not shave (including legs and underarms) for at least 48 hours prior to the first CHG shower.  You may shave your face. Please follow these instructions carefully:   1.  Shower with CHG Soap the night before surgery and the  morning of Surgery.   2.  If you choose to wash your hair, wash your hair first as usual with your  normal  Shampoo.   3.  After you shampoo, rinse your hair and body thoroughly to remove the  shampoo.                                         4.  Use CHG as you would any other liquid soap.  You can apply chg directly  to the skin and wash . Gently wash with scrungie or clean wascloth    5.  Apply the CHG Soap to your body ONLY FROM THE NECK DOWN.   Do not use on open                           Wound or open sores. Avoid contact with eyes, ears mouth and genitals (private parts).                        Genitals (private parts) with your normal soap.              6.  Wash thoroughly, paying special attention to the area where your surgery  will be performed.   7.  Thoroughly rinse your body with warm water from the neck down.   8.  DO NOT shower/wash with your normal soap after using and rinsing off  the CHG Soap .                 9.  Pat yourself dry with a clean towel.             10.  Wear clean pajamas.  11.  Place clean sheets on your bed the night of your first shower and do not  sleep with pets.  Day of Surgery : Do not apply any lotions/deodorants the morning of surgery.  Please wear clean clothes to the hospital/surgery center.  FAILURE TO FOLLOW THESE INSTRUCTIONS MAY RESULT IN THE CANCELLATION OF YOUR SURGERY    PATIENT SIGNATURE_________________________________  ______________________________________________________________________     [name]  An incentive spirometer is a tool that can help keep your lungs clear and active. This tool measures how well you are filling your lungs with each breath. Taking long deep breaths may help reverse or decrease the chance of developing breathing (pulmonary) problems (especially infection) following:  A long period of time when you are unable to move or be active. BEFORE THE PROCEDURE   If the spirometer includes an indicator to show your best effort, your nurse or respiratory therapist will set it to a desired goal.  If possible, sit up straight or lean slightly forward. Try not to slouch.  Hold the incentive spirometer in an upright position. INSTRUCTIONS FOR USE  1. Sit on the edge of your bed if possible, or sit up as far as you can in bed or on a chair. 2. Hold the incentive spirometer in an upright position. 3. Breathe out normally. 4. Place the mouthpiece in your mouth and seal your lips tightly around it. 5. Breathe in slowly and as deeply as possible, raising the piston or the ball toward the top of the column. 6. Hold your breath for 3-5 seconds or for as long as possible. Allow the piston or ball to fall to the bottom of the column. 7. Remove the mouthpiece from your mouth and breathe out normally. 8. Rest for a few seconds and repeat Steps 1 through 7 at least 10 times every 1-2 hours when you are awake.  Take your time and take a few normal breaths between deep breaths. 9. The spirometer may include an indicator to show your best effort. Use the indicator as a goal to work toward during each repetition. 10. After each set of 10 deep breaths, practice coughing to be sure your lungs are clear. If you have an incision (the cut made at the time of surgery), support your incision when coughing by placing a pillow or rolled up towels firmly against it. Once you are able to get out of bed, walk around indoors and cough well. You may stop using the incentive spirometer when instructed by your caregiver.  RISKS AND COMPLICATIONS  Take your time so you do not get dizzy or light-headed.  If you are in pain, you may need to take or ask for pain medication before doing incentive spirometry. It is harder to take a deep breath if you are having pain. AFTER USE  Rest and breathe slowly and easily.  It can be helpful to keep track of a log of your progress. Your caregiver can provide you with a simple table to help with this. If you are using the spirometer at home, follow these instructions: [place] IF:   You are having difficultly using the spirometer.  You have trouble using the spirometer as often as instructed.  Your pain medication is not giving enough relief while using the spirometer.  You develop fever of 100.5 F (38.1 C) or higher. SEEK IMMEDIATE MEDICAL CARE IF:   You cough up bloody sputum that had not been present before.  You develop fever of 102 F (  38.9 C) or greater.  You develop worsening pain at or near the incision site. MAKE SURE YOU:   Understand these instructions.  Will watch your condition.  Will get help right away if you are not doing well or get worse. Document Released: 06/23/2006 Document Revised: 05/05/2011 Document Reviewed: 08/24/2006 [facility] Patient Information 2014 [place],  [place].   ________________________________________________________________________

## 2014-04-17 ENCOUNTER — Encounter (HOSPITAL_COMMUNITY): Payer: Self-pay

## 2014-04-17 ENCOUNTER — Encounter (HOSPITAL_COMMUNITY)
Admission: RE | Admit: 2014-04-17 | Discharge: 2014-04-17 | Disposition: A | Discharge status: Home / Self Care | Payer: Medicare Other | Source: Ambulatory Visit | Attending: Orthopedic Surgery | Admitting: Orthopedic Surgery

## 2014-04-17 DIAGNOSIS — T8484XA Pain due to internal orthopedic prosthetic devices, implants and grafts, initial encounter: Secondary | ICD-10-CM | POA: Diagnosis not present

## 2014-04-17 DIAGNOSIS — M1990 Unspecified osteoarthritis, unspecified site: Secondary | ICD-10-CM | POA: Diagnosis not present

## 2014-04-17 DIAGNOSIS — F419 Anxiety disorder, unspecified: Secondary | ICD-10-CM | POA: Diagnosis not present

## 2014-04-17 DIAGNOSIS — Z9889 Other specified postprocedural states: Secondary | ICD-10-CM | POA: Diagnosis not present

## 2014-04-17 DIAGNOSIS — Z85828 Personal history of other malignant neoplasm of skin: Secondary | ICD-10-CM | POA: Diagnosis not present

## 2014-04-17 DIAGNOSIS — Z7982 Long term (current) use of aspirin: Secondary | ICD-10-CM | POA: Diagnosis not present

## 2014-04-17 DIAGNOSIS — Y929 Unspecified place or not applicable: Secondary | ICD-10-CM | POA: Diagnosis not present

## 2014-04-17 DIAGNOSIS — K219 Gastro-esophageal reflux disease without esophagitis: Secondary | ICD-10-CM | POA: Diagnosis not present

## 2014-04-17 DIAGNOSIS — Z79899 Other long term (current) drug therapy: Secondary | ICD-10-CM | POA: Diagnosis not present

## 2014-04-17 DIAGNOSIS — Y838 Other surgical procedures as the cause of abnormal reaction of the patient, or of later complication, without mention of misadventure at the time of the procedure: Secondary | ICD-10-CM | POA: Diagnosis not present

## 2014-04-17 DIAGNOSIS — I10 Essential (primary) hypertension: Secondary | ICD-10-CM | POA: Diagnosis not present

## 2014-04-17 DIAGNOSIS — M25561 Pain in right knee: Secondary | ICD-10-CM | POA: Diagnosis present

## 2014-04-17 DIAGNOSIS — Z96669 Presence of unspecified artificial ankle joint: Secondary | ICD-10-CM | POA: Diagnosis not present

## 2014-04-17 DIAGNOSIS — Z6831 Body mass index (BMI) 31.0-31.9, adult: Secondary | ICD-10-CM | POA: Diagnosis not present

## 2014-04-17 HISTORY — DX: Personal history of other malignant neoplasm of skin: Z85.828

## 2014-04-17 LAB — CBC
HEMATOCRIT: 42.4 % (ref 36.0–46.0)
HEMOGLOBIN: 14.5 g/dL (ref 12.0–15.0)
MCH: 30.9 pg (ref 26.0–34.0)
MCHC: 34.2 g/dL (ref 30.0–36.0)
MCV: 90.4 fL (ref 78.0–100.0)
Platelets: 279 10*3/uL (ref 150–400)
RBC: 4.69 MIL/uL (ref 3.87–5.11)
RDW: 13.3 % (ref 11.5–15.5)
WBC: 7.1 10*3/uL (ref 4.0–10.5)

## 2014-04-17 LAB — BASIC METABOLIC PANEL
ANION GAP: 7 (ref 5–15)
BUN: 14 mg/dL (ref 6–23)
CHLORIDE: 107 mmol/L (ref 96–112)
CO2: 27 mmol/L (ref 19–32)
CREATININE: 0.68 mg/dL (ref 0.50–1.10)
Calcium: 9.8 mg/dL (ref 8.4–10.5)
GFR calc Af Amer: >90 mL/min (ref >90)
GFR, EST NON AFRICAN AMERICAN: 89 mL/min — AB (ref >90)
Glucose, Bld: 110 mg/dL — ABNORMAL HIGH (ref 70–99)
Potassium: 4.3 mmol/L (ref 3.5–5.1)
Sodium: 141 mmol/L (ref 135–145)

## 2014-04-18 NOTE — H&P (Signed)
CC- [name] is a 69 y.o. female who presents with right knee pain.  HPI- . Knee Pain: Patient presents with knee pain involving the  right knee. Onset of the symptoms was several months ago. Inciting event: tibial plateau fracture Current symptoms include pain located lateraly over plate. Pain is aggravated by any weight bearing.  Patient has had prior knee problems. Evaluation to date: plain films show healing of the tibial plateau fracture with intact hardware. Treatment to date: PT which was somewhat effective and rest.  Past Medical History  Diagnosis Date  . Allergic rhinitis, cause unspecified   . Arthritis   . Esophageal stenosis   . Hiatal hernia   . Unspecified essential hypertension     PT STOPPED BP MED WITH PCP KNOWLEDGE  . History of nonmelanoma skin cancer     Past Surgical History  Procedure Laterality Date  . Appendectomy  1984  . Tubal ligation      76 reversal in 82 and tubal in 86  . Laparoscopic cholecystectomy  04/2004  . Laporoscopy  1977    ovarian cyst  . Lateral collateral ligament repair, knee      RT KNEE  . Hand surgery    . Total ankle replacement    . Orif tibia plateau Right 12/26/2013    Procedure: OPEN REDUCTION INTERNAL FIXATION (ORIF) TIBIAL PLATEAU;  Surgeon: [name], MD;  Location: [facility] OR[facility];  Service: Orthopedics;  Laterality: Right;  . Bladder suspension  SEVERAL YRS AGO    Prior to Admission medications   Medication Sig Start Date End Date Taking? Authorizing Provider  acetaminophen (TYLENOL) 325 MG tablet Take 650 mg by mouth every 8 (eight) hours as needed for moderate pain.   Yes Historical Provider, MD  aspirin 325 MG tablet Take 325 mg by mouth daily.   Yes Historical Provider, MD  calcium citrate-vitamin D (CITRACAL+D) 315-200 MG-UNIT per tablet Take 1 tablet by mouth every morning.   Yes Historical Provider, MD  cholecalciferol (VITAMIN D) 400 UNITS TABS tablet Take 400 Units by mouth every morning.   Yes Historical  Provider, MD  loratadine (CLARITIN) 10 MG tablet Take 10 mg by mouth daily as needed for allergies.    Yes Historical Provider, MD  Tetrahydrozoline HCl (VISINE EXTRA OP) Apply 1 drop to eye daily as needed (tired/dry eyes.).   Yes Historical Provider, MD  DULoxetine (CYMBALTA) 30 MG capsule Take 30 mg by mouth daily.    Historical Provider, MD  esomeprazole (NEXIUM) 40 MG packet Take 40 mg by mouth daily before breakfast. 05/16/10   Sable Feil, MD  ezetimibe-simvastatin (VYTORIN) 10-10 MG per tablet Take 1 tablet by mouth at bedtime.    Historical Provider, MD  fluconazole (DIFLUCAN) 100 MG tablet Take 1 tablet (100 mg total) by mouth daily. Patient not taking: Reported on 04/17/2014 12/28/13   Arlee Muslim, PA-C  levofloxacin (LEVAQUIN) 750 MG tablet Take 1 tablet (750 mg total) by mouth daily. Patient not taking: Reported on 04/17/2014 12/28/13   Arlee Muslim, PA-C  methocarbamol (ROBAXIN) 500 MG tablet Take 1 tablet (500 mg total) by mouth every 6 (six) hours as needed for muscle spasms. Patient not taking: Reported on 04/17/2014 12/28/13   Arlee Muslim, PA-C  oxyCODONE (OXY IR/ROXICODONE) 5 MG immediate release tablet Take 1-4 tablets (5-20 mg total) by mouth every 4 (four) hours as needed for moderate pain or severe pain. Patient not taking: Reported on 04/17/2014 12/28/13   Arlee Muslim, PA-C  KNEE EXAM antalgic gait, soft tissue tenderness over lateral knee and lateral leg, no effusion, negative drawer sign, collateral ligaments intact  Physical Examination: General appearance - alert, well appearing, and in no distress Mental status - alert, oriented to person, place, and time Chest - clear to auscultation, no wheezes, rales or rhonchi, symmetric air entry Heart - normal rate, regular rhythm, normal S1, S2, no murmurs, rubs, clicks or gallops Abdomen- Non-tender; non-distended, normal bowel sounds  Asessment/Plan--- Right kneepainful harw- - Plan left knee arthroscopy with meniscal  debridement. Procedure risks and potential comps discussed with patient who elects to proceed. Goals are decreased pain and increased function with a high likelihood of achieving both

## 2014-04-19 ENCOUNTER — Ambulatory Visit (HOSPITAL_COMMUNITY): Payer: Medicare Other | Admitting: Anesthesiology

## 2014-04-19 ENCOUNTER — Encounter (HOSPITAL_COMMUNITY): Payer: Self-pay | Admitting: *Deleted

## 2014-04-19 ENCOUNTER — Ambulatory Visit (HOSPITAL_COMMUNITY)
Admission: RE | Admit: 2014-04-19 | Discharge: 2014-04-19 | Disposition: A | Discharge status: Home / Self Care | Payer: Medicare Other | Source: Ambulatory Visit | Attending: Orthopedic Surgery | Admitting: Orthopedic Surgery

## 2014-04-19 ENCOUNTER — Encounter (HOSPITAL_COMMUNITY)
Admission: RE | Disposition: A | Discharge status: Home / Self Care | Payer: Self-pay | Source: Ambulatory Visit | Attending: Orthopedic Surgery

## 2014-04-19 DIAGNOSIS — T8484XA Pain due to internal orthopedic prosthetic devices, implants and grafts, initial encounter: Secondary | ICD-10-CM | POA: Diagnosis not present

## 2014-04-19 DIAGNOSIS — M1990 Unspecified osteoarthritis, unspecified site: Secondary | ICD-10-CM | POA: Diagnosis not present

## 2014-04-19 DIAGNOSIS — Y929 Unspecified place or not applicable: Secondary | ICD-10-CM | POA: Insufficient documentation

## 2014-04-19 DIAGNOSIS — Y838 Other surgical procedures as the cause of abnormal reaction of the patient, or of later complication, without mention of misadventure at the time of the procedure: Secondary | ICD-10-CM | POA: Insufficient documentation

## 2014-04-19 DIAGNOSIS — K219 Gastro-esophageal reflux disease without esophagitis: Secondary | ICD-10-CM | POA: Diagnosis not present

## 2014-04-19 DIAGNOSIS — Z79899 Other long term (current) drug therapy: Secondary | ICD-10-CM | POA: Insufficient documentation

## 2014-04-19 DIAGNOSIS — F419 Anxiety disorder, unspecified: Secondary | ICD-10-CM | POA: Insufficient documentation

## 2014-04-19 DIAGNOSIS — Z9889 Other specified postprocedural states: Secondary | ICD-10-CM | POA: Insufficient documentation

## 2014-04-19 DIAGNOSIS — I10 Essential (primary) hypertension: Secondary | ICD-10-CM | POA: Diagnosis not present

## 2014-04-19 DIAGNOSIS — Z6831 Body mass index (BMI) 31.0-31.9, adult: Secondary | ICD-10-CM | POA: Insufficient documentation

## 2014-04-19 DIAGNOSIS — Z96669 Presence of unspecified artificial ankle joint: Secondary | ICD-10-CM | POA: Insufficient documentation

## 2014-04-19 DIAGNOSIS — Z7982 Long term (current) use of aspirin: Secondary | ICD-10-CM | POA: Insufficient documentation

## 2014-04-19 DIAGNOSIS — Z85828 Personal history of other malignant neoplasm of skin: Secondary | ICD-10-CM | POA: Insufficient documentation

## 2014-04-19 HISTORY — PX: HARDWARE REMOVAL: SHX979

## 2014-04-19 SURGERY — REMOVAL, HARDWARE
Anesthesia: General | Site: Leg Lower | Laterality: Right

## 2014-04-19 MED ORDER — PROMETHAZINE HCL 25 MG/ML IJ SOLN: 6.2500 mg | INTRAMUSCULAR | Status: DC | PRN

## 2014-04-19 MED ORDER — DEXAMETHASONE SODIUM PHOSPHATE 10 MG/ML IJ SOLN
INTRAMUSCULAR | Status: DC | PRN
  Administered 2014-04-19: 10 mg via INTRAVENOUS

## 2014-04-19 MED ORDER — DEXAMETHASONE SODIUM PHOSPHATE 10 MG/ML IJ SOLN: 10.0000 mg | Freq: Once | INTRAMUSCULAR | Status: DC

## 2014-04-19 MED ORDER — METHOCARBAMOL 1000 MG/10ML IJ SOLN
500.0000 mg | Freq: Once | INTRAVENOUS | Status: AC
  Administered 2014-04-19: 500 mg via INTRAVENOUS
  Filled 2014-04-19: qty 5

## 2014-04-19 MED ORDER — OXYCODONE HCL 5 MG PO TABS: 5.0000 mg | ORAL_TABLET | Freq: Once | ORAL | Status: DC

## 2014-04-19 MED ORDER — FENTANYL CITRATE 0.05 MG/ML IJ SOLN
INTRAMUSCULAR | Status: AC
  Filled 2014-04-19: qty 2

## 2014-04-19 MED ORDER — 0.9 % SODIUM CHLORIDE (POUR BTL) OPTIME
TOPICAL | Status: DC | PRN
  Administered 2014-04-19: 1000 mL

## 2014-04-19 MED ORDER — FENTANYL CITRATE 0.05 MG/ML IJ SOLN
25.0000 ug | INTRAMUSCULAR | Status: DC | PRN
  Administered 2014-04-19 (×3): 50 ug via INTRAVENOUS

## 2014-04-19 MED ORDER — CEFAZOLIN SODIUM-DEXTROSE 2-3 GM-% IV SOLR
INTRAVENOUS | Status: AC
  Filled 2014-04-19: qty 50

## 2014-04-19 MED ORDER — MIDAZOLAM HCL 5 MG/5ML IJ SOLN
INTRAMUSCULAR | Status: DC | PRN
  Administered 2014-04-19: 2 mg via INTRAVENOUS

## 2014-04-19 MED ORDER — SODIUM CHLORIDE 0.9 % IV SOLN: INTRAVENOUS | Status: DC

## 2014-04-19 MED ORDER — MEPERIDINE HCL 50 MG/ML IJ SOLN: 6.2500 mg | INTRAMUSCULAR | Status: DC | PRN

## 2014-04-19 MED ORDER — ACETAMINOPHEN 10 MG/ML IV SOLN
1000.0000 mg | Freq: Once | INTRAVENOUS | Status: AC
  Administered 2014-04-19: 1000 mg via INTRAVENOUS
  Filled 2014-04-19: qty 100

## 2014-04-19 MED ORDER — KETOROLAC TROMETHAMINE 15 MG/ML IJ SOLN
15.0000 mg | Freq: Once | INTRAMUSCULAR | Status: AC
  Administered 2014-04-19: 15 mg via INTRAVENOUS

## 2014-04-19 MED ORDER — OXYCODONE HCL 5 MG PO TABS: 5.0000 mg | ORAL_TABLET | ORAL | Status: DC | PRN

## 2014-04-19 MED ORDER — DEXAMETHASONE SODIUM PHOSPHATE 10 MG/ML IJ SOLN
INTRAMUSCULAR | Status: AC
  Filled 2014-04-19: qty 1

## 2014-04-19 MED ORDER — FENTANYL CITRATE 0.05 MG/ML IJ SOLN
INTRAMUSCULAR | Status: DC | PRN
  Administered 2014-04-19 (×2): 50 ug via INTRAVENOUS

## 2014-04-19 MED ORDER — MIDAZOLAM HCL 2 MG/2ML IJ SOLN
INTRAMUSCULAR | Status: AC
  Filled 2014-04-19: qty 2

## 2014-04-19 MED ORDER — LIDOCAINE HCL (CARDIAC) 20 MG/ML IV SOLN
INTRAVENOUS | Status: DC | PRN
  Administered 2014-04-19: 30 mg via INTRAVENOUS

## 2014-04-19 MED ORDER — LIDOCAINE HCL (CARDIAC) 20 MG/ML IV SOLN
INTRAVENOUS | Status: AC
  Filled 2014-04-19: qty 5

## 2014-04-19 MED ORDER — BUPIVACAINE-EPINEPHRINE (PF) 0.25% -1:200000 IJ SOLN
INTRAMUSCULAR | Status: DC | PRN
  Administered 2014-04-19: 30 mL

## 2014-04-19 MED ORDER — OXYCODONE HCL 5 MG PO TABS
ORAL_TABLET | ORAL | Status: AC
  Filled 2014-04-19: qty 2

## 2014-04-19 MED ORDER — ONDANSETRON HCL 4 MG/2ML IJ SOLN
INTRAMUSCULAR | Status: AC
  Filled 2014-04-19: qty 2

## 2014-04-19 MED ORDER — FENTANYL CITRATE 0.05 MG/ML IJ SOLN
INTRAMUSCULAR | Status: AC
  Administered 2014-04-19: 50 ug via INTRAVENOUS
  Filled 2014-04-19: qty 2

## 2014-04-19 MED ORDER — LACTATED RINGERS IV SOLN
INTRAVENOUS | Status: DC | PRN
  Administered 2014-04-19: 16:00:00 via INTRAVENOUS

## 2014-04-19 MED ORDER — PROPOFOL 10 MG/ML IV BOLUS
INTRAVENOUS | Status: AC
  Filled 2014-04-19: qty 20

## 2014-04-19 MED ORDER — ONDANSETRON HCL 4 MG/2ML IJ SOLN
INTRAMUSCULAR | Status: DC | PRN
  Administered 2014-04-19: 4 mg via INTRAVENOUS

## 2014-04-19 MED ORDER — KETOROLAC TROMETHAMINE 15 MG/ML IJ SOLN
INTRAMUSCULAR | Status: AC
  Administered 2014-04-19: 15 mg via INTRAVENOUS
  Filled 2014-04-19: qty 1

## 2014-04-19 MED ORDER — CHLORHEXIDINE GLUCONATE 4 % EX LIQD: 60.0000 mL | Freq: Once | CUTANEOUS | Status: DC

## 2014-04-19 MED ORDER — CEFAZOLIN SODIUM-DEXTROSE 2-3 GM-% IV SOLR
2.0000 g | INTRAVENOUS | Status: AC
  Administered 2014-04-19: 2 g via INTRAVENOUS

## 2014-04-19 MED ORDER — LACTATED RINGERS IV SOLN: INTRAVENOUS | Status: DC

## 2014-04-19 MED ORDER — METHOCARBAMOL 500 MG PO TABS: 500.0000 mg | ORAL_TABLET | Freq: Four times a day (QID) | ORAL | Status: DC

## 2014-04-19 MED ORDER — PROPOFOL 10 MG/ML IV BOLUS
INTRAVENOUS | Status: DC | PRN
  Administered 2014-04-19: 140 mg via INTRAVENOUS

## 2014-04-19 MED ORDER — BUPIVACAINE-EPINEPHRINE (PF) 0.25% -1:200000 IJ SOLN
INTRAMUSCULAR | Status: AC
  Filled 2014-04-19: qty 30

## 2014-04-19 SURGICAL SUPPLY — 43 items
BANDAGE ELASTIC 6 VELCRO ST LF (GAUZE/BANDAGES/DRESSINGS) ×2 IMPLANT
BANDAGE ESMARK 6X9 LF (GAUZE/BANDAGES/DRESSINGS) ×1 IMPLANT
BNDG CMPR 9X6 STRL LF SNTH (GAUZE/BANDAGES/DRESSINGS) ×1
BNDG ESMARK 6X9 LF (GAUZE/BANDAGES/DRESSINGS) ×2
CUFF TOURN SGL QUICK 18 (TOURNIQUET CUFF) IMPLANT
CUFF TOURN SGL QUICK 34 (TOURNIQUET CUFF) ×2
CUFF TRNQT CYL 34X4X40X1 (TOURNIQUET CUFF) IMPLANT
DRAPE C-ARM 42X120 X-RAY (DRAPES) ×1 IMPLANT
DRAPE C-ARMOR (DRAPES) ×1 IMPLANT
DRAPE EXTREMITY T 121X128X90 (DRAPE) ×2 IMPLANT
DRAPE INCISE IOBAN 66X45 STRL (DRAPES) ×2 IMPLANT
DRAPE ORTHO SPLIT 77X108 STRL (DRAPES)
DRAPE SURG ORHT 6 SPLT 77X108 (DRAPES) IMPLANT
DRSG ADAPTIC 3X8 NADH LF (GAUZE/BANDAGES/DRESSINGS) ×2 IMPLANT
DRSG PAD ABDOMINAL 8X10 ST (GAUZE/BANDAGES/DRESSINGS) ×2 IMPLANT
DURAPREP 26ML APPLICATOR (WOUND CARE) ×2 IMPLANT
ELECT REM PT RETURN 9FT ADLT (ELECTROSURGICAL) ×2
ELECTRODE REM PT RTRN 9FT ADLT (ELECTROSURGICAL) ×1 IMPLANT
GAUZE SPONGE 4X4 12PLY STRL (GAUZE/BANDAGES/DRESSINGS) ×2 IMPLANT
GLOVE BIO SURGEON STRL SZ7.5 (GLOVE) ×2 IMPLANT
GLOVE BIO SURGEON STRL SZ8 (GLOVE) ×3 IMPLANT
GLOVE BIOGEL PI IND STRL 8 (GLOVE) ×3 IMPLANT
GLOVE BIOGEL PI INDICATOR 8 (GLOVE) ×2
GOWN STRL REUS W/TWL LRG LVL3 (GOWN DISPOSABLE) ×2 IMPLANT
GOWN STRL REUS W/TWL XL LVL3 (GOWN DISPOSABLE) ×2 IMPLANT
KIT BASIN OR (CUSTOM PROCEDURE TRAY) ×2 IMPLANT
MANIFOLD NEPTUNE II (INSTRUMENTS) ×2 IMPLANT
NDL SAFETY ECLIPSE 18X1.5 (NEEDLE) IMPLANT
NEEDLE HYPO 18GX1.5 SHARP (NEEDLE) ×2
NS IRRIG 1000ML POUR BTL (IV SOLUTION) ×1 IMPLANT
PACK TOTAL JOINT (CUSTOM PROCEDURE TRAY) ×2 IMPLANT
PADDING CAST COTTON 6X4 STRL (CAST SUPPLIES) ×4 IMPLANT
POSITIONER SURGICAL ARM (MISCELLANEOUS) ×1 IMPLANT
STAPLER VISISTAT 35W (STAPLE) IMPLANT
STRIP CLOSURE SKIN 1/2X4 (GAUZE/BANDAGES/DRESSINGS) ×3 IMPLANT
SUT MNCRL AB 4-0 PS2 18 (SUTURE) ×2 IMPLANT
SUT VIC AB 0 CT1 36 (SUTURE) ×4 IMPLANT
SUT VIC AB 2-0 CT1 27 (SUTURE) ×2
SUT VIC AB 2-0 CT1 TAPERPNT 27 (SUTURE) ×2 IMPLANT
SYR 20CC LL (SYRINGE) ×1 IMPLANT
TOWEL OR 17X26 10 PK STRL BLUE (TOWEL DISPOSABLE) ×3 IMPLANT
UNDERPAD 30X30 INCONTINENT (UNDERPADS AND DIAPERS) ×1 IMPLANT
WATER STERILE IRR 1500ML POUR (IV SOLUTION) ×1 IMPLANT

## 2014-04-19 NOTE — Brief Op Note (Signed)
04/19/2014  5:43 PM  PATIENT:  Delila Spence  69 y.o. female  PRE-OPERATIVE DIAGNOSIS:  RETAINED HARDWARE FROM RIGHT TIBIAL  POST-OPERATIVE DIAGNOSIS:  RETAINED HARDWARE FROM RIGHT TIBIAL  PROCEDURE:  Procedure(s): HARDWARE REMOVAL FROM RIGHT TIBIA (Right)  SURGEON:  Surgeon(s) and Role:    * [name], MD - Primary  PHYSICIAN ASSISTANT:   ASSISTANTS: Arlee Muslim, PA-C   ANESTHESIA:   general  EBL:     BLOOD ADMINISTERED:none  DRAINS: none   LOCAL MEDICATIONS USED:  MARCAINE     COUNTS:  YES  TOURNIQUET:   Total Tourniquet Time Documented: Thigh (Right) - 41 minutes Total: Thigh (Right) - 41 minutes   DICTATION: .Other Dictation: Dictation Number [id number]  PLAN OF CARE: Discharge to home after PACU  PATIENT DISPOSITION:  PACU - hemodynamically stable.

## 2014-04-19 NOTE — Anesthesia Preprocedure Evaluation (Addendum)
Anesthesia Evaluation  Patient identified by MRN, date of birth, ID band Patient awake    Reviewed: Allergy & Precautions, H&P , NPO status , Patient's Chart, lab work & pertinent test results  History of Anesthesia Complications Negative for: history of anesthetic complications  Airway Mallampati: II  TM Distance: >3 FB Neck ROM: Full    Dental  (+) Teeth Intact   Pulmonary neg pulmonary ROS,  breath sounds clear to auscultation        Cardiovascular hypertension, Rhythm:Regular     Neuro/Psych  Headaches, PSYCHIATRIC DISORDERS Anxiety  Neuromuscular disease    GI/Hepatic hiatal hernia, GERD-  Medicated and Controlled,  Endo/Other  Morbid obesity  Renal/GU      Musculoskeletal  (+) Arthritis -, Right tibial plateau   Abdominal   Peds  Hematology  (+) anemia ,   Anesthesia Other Findings   Reproductive/Obstetrics                             Anesthesia Physical  Anesthesia Plan  ASA: II  Anesthesia Plan: General   Post-op Pain Management:    Induction: Intravenous  Airway Management Planned: LMA  Additional Equipment: None  Intra-op Plan:   Post-operative Plan: Extubation in OR  Informed Consent: I have reviewed the patients History and Physical, chart, labs and discussed the procedure including the risks, benefits and alternatives for the proposed anesthesia with the patient or authorized representative who has indicated his/her understanding and acceptance.   Dental advisory given  Plan Discussed with: CRNA and Surgeon  Anesthesia Plan Comments:         Anesthesia Quick Evaluation

## 2014-04-19 NOTE — Interval H&P Note (Signed)
History and Physical Interval Note:  04/19/2014 4:35 PM  [name]  has presented today for surgery, with the diagnosis of RETAINED HARDWARE FROM RIGHT TIBIAL  The various methods of treatment have been discussed with the patient and family. After consideration of risks, benefits and other options for treatment, the patient has consented to  Procedure(s): HARDWARE REMOVAL FROM RIGHT TIBIA (Right) as a surgical intervention .  The patient's history has been reviewed, patient examined, no change in status, stable for surgery.  I have reviewed the patient's chart and labs.  Questions were answered to the patient's satisfaction.     [name]

## 2014-04-19 NOTE — Transfer of Care (Signed)
Immediate Anesthesia Transfer of Care Note  Patient: [name]  Procedure(s) Performed: Procedure(s) (LRB): HARDWARE REMOVAL FROM RIGHT TIBIA (Right)  Patient Location: PACU  Anesthesia Type: General  Level of Consciousness: sedated, patient cooperative and responds to stimulation  Airway & Oxygen Therapy: Patient Spontanous Breathing and Patient connected to face mask oxgen  Post-op Assessment: Report given to PACU RN and Post -op Vital signs reviewed and stable  Post vital signs: Reviewed and stable  Complications: No apparent anesthesia complications

## 2014-04-19 NOTE — Discharge Instructions (Signed)
Resume your Aspirin tomorrow  You may bear weight as tolerated on the right leg using your walker.  You may remove your large bandage and shower in 2 days

## 2014-04-20 ENCOUNTER — Encounter (HOSPITAL_COMMUNITY): Payer: Self-pay | Admitting: Orthopedic Surgery

## 2014-04-20 NOTE — Op Note (Signed)
NAMECATELIN, [name] NO.:  [id number]  MEDICAL RECORD NO.:  [id number]  LOCATION:  [facility]                         FACILITY:  [facility]  PHYSICIAN:  Gaynelle Arabian, M.D.    DATE OF BIRTH:  09/21/1945  DATE OF PROCEDURE:  04/19/2014 DATE OF DISCHARGE:  04/19/2014                              OPERATIVE REPORT   PREOPERATIVE DIAGNOSIS:  Painful hardware, right tibia.  POSTOPERATIVE DIAGNOSIS:  Painful hardware, right tibia.  PROCEDURE:  Hardware removal, right tibia.  SURGEON:  Gaynelle Arabian, M.D.  ASSISTANT:  Alexzandrew L. Dara Lords, PA-C  ANESTHESIA:  General.  ESTIMATED BLOOD LOSS:  Minimal.  DRAINS:  None.  TOURNIQUET TIME:  41 minutes at 300 mmHg.  COMPLICATIONS:  None.  CONDITION:  Stable to recovery.  BRIEF CLINICAL NOTE:  [name] is a 69 year old female, who had a right tibial plateau fracture several months ago.  She has healed uneventfully.  She is having an upcoming knee replacement in about 3 months and we elected to remove the hardware now as it would be safer to do so in a staged fashion rather than do at the time of her knee replacement.  She presents for hardware removal.  PROCEDURE IN DETAIL:  After successful administration of general anesthetic, a tourniquet was placed high on her right thigh and right lower extremity, prepped and draped in the usual sterile fashion. Extremities wrapped in Esmarch, tourniquet inflated to 300 mmHg.  Her previous hockey stick incision was reutilized skin cut with a 10 blade through subcutaneous tissue down to the level of the plate.  All the screws were removed from the plate and the plate was subsequently removed.  The wound was then copiously irrigated with saline solution. The subcu tissues then closed with interrupted 2-0 Vicryl.  The tourniquet was released, total time of 41 minutes.  Subcuticular tissue was closed with running 4-0 Monocryl.  Incisions cleaned and dried.  Steri-Strips and a bulky  sterile dressing applied.  She was then awakened and transported to recovery in stable condition.     Gaynelle Arabian, M.D.     FA/MEDQ  D:  04/19/2014  T:  04/20/2014  Job:  [id number]

## 2014-04-20 NOTE — Anesthesia Postprocedure Evaluation (Signed)
  Anesthesia Post-op Note  Patient: [name]  Procedure(s) Performed: Procedure(s): HARDWARE REMOVAL FROM RIGHT TIBIA (Right)  Patient Location: PACU  Anesthesia Type:General  Level of Consciousness: awake, alert , oriented and patient cooperative  Airway and Oxygen Therapy: Patient Spontanous Breathing  Post-op Pain: mild  Post-op Assessment: Post-op Vital signs reviewed, Patient's Cardiovascular Status Stable, Respiratory Function Stable, Patent Airway, No signs of Nausea or vomiting and Pain level controlled  Post-op Vital Signs: stable  Last Vitals:  Filed Vitals:   04/19/14 1900  BP: 126/63  Pulse: 97  Temp:   Resp: 15    Complications: No apparent anesthesia complications

## 2014-07-05 NOTE — Progress Notes (Signed)
[name] Pre Op Orders   Please.  Thanks

## 2014-07-07 ENCOUNTER — Ambulatory Visit: Payer: Self-pay | Admitting: Orthopedic Surgery

## 2014-07-07 NOTE — Progress Notes (Signed)
Surgery on 07/17/14.  Preop on 5/18 at 200pm.  Need orders in EPIC.  Thank You.

## 2014-07-07 NOTE — Progress Notes (Signed)
Preoperative surgical orders have been place into the Epic hospital system for [facility] on 07/07/2014, 12:10 PM  by Mickel Crow for surgery on 07-17-2014.  Preop Total Knee orders including Experal, IV Tylenol, and IV Decadron as long as there are no contraindications to the above medications. Arlee Muslim, PA-C

## 2014-07-12 ENCOUNTER — Encounter (HOSPITAL_COMMUNITY)
Admission: RE | Admit: 2014-07-12 | Discharge: 2014-07-12 | Disposition: A | Discharge status: Home / Self Care | Payer: Medicare Other | Source: Ambulatory Visit | Attending: Orthopedic Surgery | Admitting: Orthopedic Surgery

## 2014-07-12 ENCOUNTER — Encounter (HOSPITAL_COMMUNITY): Payer: Self-pay

## 2014-07-12 DIAGNOSIS — Z01812 Encounter for preprocedural laboratory examination: Secondary | ICD-10-CM | POA: Diagnosis present

## 2014-07-12 HISTORY — DX: Malignant (primary) neoplasm, unspecified: C80.1

## 2014-07-12 LAB — URINE MICROSCOPIC-ADD ON

## 2014-07-12 LAB — URINALYSIS, ROUTINE W REFLEX MICROSCOPIC
Glucose, UA: NEGATIVE mg/dL
Ketones, ur: NEGATIVE mg/dL
NITRITE: NEGATIVE
Protein, ur: 30 mg/dL — AB
Specific Gravity, Urine: 1.029 (ref 1.005–1.030)
Urobilinogen, UA: 0.2 mg/dL (ref 0.0–1.0)
pH: 6 (ref 5.0–8.0)

## 2014-07-12 LAB — SURGICAL PCR SCREEN
MRSA, PCR: NEGATIVE
STAPHYLOCOCCUS AUREUS: NEGATIVE

## 2014-07-12 LAB — APTT: aPTT: 75 seconds — ABNORMAL HIGH (ref 24–37)

## 2014-07-12 LAB — PROTIME-INR
INR: 1.13 (ref 0.00–1.49)
Prothrombin Time: 14.7 seconds (ref 11.6–15.2)

## 2014-07-12 NOTE — Patient Instructions (Addendum)
YOUR PROCEDURE IS SCHEDULED ON :  07/17/14  REPORT TO [facility] MAIN ENTRANCE FOLLOW SIGNS TO SHORT STAY CENTER AT :  8:30 AM  CALL THIS NUMBER IF YOU HAVE PROBLEMS THE MORNING OF SURGERY [phone]  REMEMBER:  DO NOT EAT FOOD OR DRINK LIQUIDS AFTER MIDNIGHT  TAKE THESE MEDICINES THE MORNING OF SURGERY:  AMLODIPINE / NEXIUM  YOU MAY NOT HAVE ANY METAL ON YOUR BODY INCLUDING HAIR PINS AND PIERCING'S. DO NOT WEAR JEWELRY, MAKEUP, LOTIONS, POWDERS OR PERFUMES. DO NOT WEAR NAIL POLISH. DO NOT SHAVE 48 HRS PRIOR TO SURGERY. MEN MAY SHAVE FACE AND NECK.  DO NOT [place]. [facility] IS NOT RESPONSIBLE FOR VALUABLES.  CONTACTS, DENTURES OR PARTIALS MAY NOT BE WORN TO SURGERY. LEAVE SUITCASE IN CAR. CAN BE BROUGHT TO ROOM AFTER SURGERY.  PATIENTS DISCHARGED THE DAY OF SURGERY WILL NOT BE ALLOWED TO DRIVE HOME.  PLEASE READ OVER THE FOLLOWING INSTRUCTION SHEETS _________________________________________________________________________________                                          [facility] - PREPARING FOR SURGERY  Before surgery, you can play an important role.  Because skin is not sterile, your skin needs to be as free of germs as possible.  You can reduce the number of germs on your skin by washing with CHG (chlorahexidine gluconate) soap before surgery.  CHG is an antiseptic cleaner which kills germs and bonds with the skin to continue killing germs even after washing. Please DO NOT use if you have an allergy to CHG or antibacterial soaps.  If your skin becomes reddened/irritated stop using the CHG and inform your nurse when you arrive at Short Stay. Do not shave (including legs and underarms) for at least 48 hours prior to the first CHG shower.  You may shave your face. Please follow these instructions carefully:   1.  Shower with CHG Soap the night before surgery and the  morning of Surgery.   2.  If you choose to wash your hair, wash your  hair first as usual with your  normal  Shampoo.   3.  After you shampoo, rinse your hair and body thoroughly to remove the  shampoo.                                         4.  Use CHG as you would any other liquid soap.  You can apply chg directly  to the skin and wash . Gently wash with scrungie or clean wascloth    5.  Apply the CHG Soap to your body ONLY FROM THE NECK DOWN.   Do not use on open                           Wound or open sores. Avoid contact with eyes, ears mouth and genitals (private parts).                        Genitals (private parts) with your normal soap.              6.  Wash thoroughly, paying special attention to the area where your surgery  will be  performed.   7.  Thoroughly rinse your body with warm water from the neck down.   8.  DO NOT shower/wash with your normal soap after using and rinsing off  the CHG Soap .                9.  Pat yourself dry with a clean towel.             10.  Wear clean night clothes to bed after shower             11.  Place clean sheets on your bed the night of your first shower and do not  sleep with pets.  Day of Surgery : Do not apply any lotions/deodorants the morning of surgery.  Please wear clean clothes to the hospital/surgery center.  FAILURE TO FOLLOW THESE INSTRUCTIONS MAY RESULT IN THE CANCELLATION OF YOUR SURGERY    PATIENT SIGNATURE_________________________________  ______________________________________________________________________     [name]  An incentive spirometer is a tool that can help keep your lungs clear and active. This tool measures how well you are filling your lungs with each breath. Taking long deep breaths may help reverse or decrease the chance of developing breathing (pulmonary) problems (especially infection) following:  A long period of time when you are unable to move or be active. BEFORE THE PROCEDURE   If the spirometer includes an indicator to show your best  effort, your nurse or respiratory therapist will set it to a desired goal.  If possible, sit up straight or lean slightly forward. Try not to slouch.  Hold the incentive spirometer in an upright position. INSTRUCTIONS FOR USE   Sit on the edge of your bed if possible, or sit up as far as you can in bed or on a chair.  Hold the incentive spirometer in an upright position.  Breathe out normally.  Place the mouthpiece in your mouth and seal your lips tightly around it.  Breathe in slowly and as deeply as possible, raising the piston or the ball toward the top of the column.  Hold your breath for 3-5 seconds or for as long as possible. Allow the piston or ball to fall to the bottom of the column.  Remove the mouthpiece from your mouth and breathe out normally.  Rest for a few seconds and repeat Steps 1 through 7 at least 10 times every 1-2 hours when you are awake. Take your time and take a few normal breaths between deep breaths.  The spirometer may include an indicator to show your best effort. Use the indicator as a goal to work toward during each repetition.  After each set of 10 deep breaths, practice coughing to be sure your lungs are clear. If you have an incision (the cut made at the time of surgery), support your incision when coughing by placing a pillow or rolled up towels firmly against it. Once you are able to get out of bed, walk around indoors and cough well. You may stop using the incentive spirometer when instructed by your caregiver.  RISKS AND COMPLICATIONS  Take your time so you do not get dizzy or light-headed.  If you are in pain, you may need to take or ask for pain medication before doing incentive spirometry. It is harder to take a deep breath if you are having pain. AFTER USE  Rest and breathe slowly and easily.  It can be helpful to keep track of a log of your progress. Your caregiver  can provide you with a simple table to help with this. If you are using  the spirometer at home, follow these instructions: [place] IF:   You are having difficultly using the spirometer.  You have trouble using the spirometer as often as instructed.  Your pain medication is not giving enough relief while using the spirometer.  You develop fever of 100.5 F (38.1 C) or higher. SEEK IMMEDIATE MEDICAL CARE IF:   You cough up bloody sputum that had not been present before.  You develop fever of 102 F (38.9 C) or greater.  You develop worsening pain at or near the incision site. MAKE SURE YOU:   Understand these instructions.  Will watch your condition.  Will get help right away if you are not doing well or get worse. Document Released: 06/23/2006 Document Revised: 05/05/2011 Document Reviewed: 08/24/2006 Exit[employer] Patient Information 2014 Exit[employer], [place].   ________________________________________________________________________  WHAT IS A BLOOD TRANSFUSION? Blood Transfusion Information  A transfusion is the replacement of blood or some of its parts. Blood is made up of multiple cells which provide different functions.  Red blood cells carry oxygen and are used for blood loss replacement.  White blood cells fight against infection.  Platelets control bleeding.  Plasma helps clot blood.  Other blood products are available for specialized needs, such as hemophilia or other clotting disorders. BEFORE THE TRANSFUSION  Who gives blood for transfusions?   Healthy volunteers who are fully evaluated to make sure their blood is safe. This is blood bank blood. Transfusion therapy is the safest it has ever been in the practice of medicine. Before blood is taken from a donor, a complete history is taken to make sure that person has no history of diseases nor engages in risky social behavior (examples are intravenous drug use or sexual activity with multiple partners). The donor's travel history is screened to minimize risk of transmitting  infections, such as malaria. The donated blood is tested for signs of infectious diseases, such as HIV and hepatitis. The blood is then tested to be sure it is compatible with you in order to minimize the chance of a transfusion reaction. If you or a relative donates blood, this is often done in anticipation of surgery and is not appropriate for emergency situations. It takes many days to process the donated blood. RISKS AND COMPLICATIONS Although transfusion therapy is very safe and saves many lives, the main dangers of transfusion include:   Getting an infectious disease.  Developing a transfusion reaction. This is an allergic reaction to something in the blood you were given. Every precaution is taken to prevent this. The decision to have a blood transfusion has been considered carefully by your caregiver before blood is given. Blood is not given unless the benefits outweigh the risks. AFTER THE TRANSFUSION  Right after receiving a blood transfusion, you will usually feel much better and more energetic. This is especially true if your red blood cells have gotten low (anemic). The transfusion raises the level of the red blood cells which carry oxygen, and this usually causes an energy increase.  The nurse administering the transfusion will monitor you carefully for complications. HOME CARE INSTRUCTIONS  No special instructions are needed after a transfusion. You may find your energy is better. Speak with your caregiver about any limitations on activity for underlying diseases you may have. SEEK MEDICAL CARE IF:   Your condition is not improving after your transfusion.  You develop redness or irritation at the  intravenous (IV) site. SEEK IMMEDIATE MEDICAL CARE IF:  Any of the following symptoms occur over the next 12 hours:  Shaking chills.  You have a temperature by mouth above 102 F (38.9 C), not controlled by medicine.  Chest, back, or muscle pain.  People around you feel you are  not acting correctly or are confused.  Shortness of breath or difficulty breathing.  Dizziness and fainting.  You get a rash or develop hives.  You have a decrease in urine output.  Your urine turns a dark color or changes to pink, red, or brown. Any of the following symptoms occur over the next 10 days:  You have a temperature by mouth above 102 F (38.9 C), not controlled by medicine.  Shortness of breath.  Weakness after normal activity.  The white part of the eye turns yellow (jaundice).  You have a decrease in the amount of urine or are urinating less often.  Your urine turns a dark color or changes to pink, red, or brown. Document Released: 02/08/2000 Document Revised: 05/05/2011 Document Reviewed: 09/27/2007 [facility] Patient Information 2014 [place], [place].  _______________________________________________________________________

## 2014-07-13 ENCOUNTER — Ambulatory Visit: Payer: Self-pay | Admitting: Orthopedic Surgery

## 2014-07-13 DIAGNOSIS — Z01812 Encounter for preprocedural laboratory examination: Secondary | ICD-10-CM | POA: Diagnosis not present

## 2014-07-13 DIAGNOSIS — K449 Diaphragmatic hernia without obstruction or gangrene: Secondary | ICD-10-CM | POA: Diagnosis present

## 2014-07-13 DIAGNOSIS — K219 Gastro-esophageal reflux disease without esophagitis: Secondary | ICD-10-CM | POA: Diagnosis present

## 2014-07-13 DIAGNOSIS — M25561 Pain in right knee: Secondary | ICD-10-CM | POA: Diagnosis present

## 2014-07-13 DIAGNOSIS — M1711 Unilateral primary osteoarthritis, right knee: Secondary | ICD-10-CM | POA: Diagnosis present

## 2014-07-13 DIAGNOSIS — I10 Essential (primary) hypertension: Secondary | ICD-10-CM | POA: Diagnosis present

## 2014-07-13 DIAGNOSIS — E780 Pure hypercholesterolemia: Secondary | ICD-10-CM | POA: Diagnosis present

## 2014-07-13 DIAGNOSIS — G8929 Other chronic pain: Secondary | ICD-10-CM | POA: Diagnosis present

## 2014-07-13 DIAGNOSIS — Z7982 Long term (current) use of aspirin: Secondary | ICD-10-CM | POA: Diagnosis not present

## 2014-07-13 DIAGNOSIS — Z79899 Other long term (current) drug therapy: Secondary | ICD-10-CM | POA: Diagnosis not present

## 2014-07-13 LAB — ABO/RH: ABO/RH(D): O POS

## 2014-07-13 NOTE — Progress Notes (Signed)
Abnormal UA / PTT / PT routed to Dr. Wynelle Link

## 2014-07-14 NOTE — Progress Notes (Signed)
Pt notified of date and time change of surgery to 08/09/14 at 10:40 am - instructed to arrive at short stay at 8:15 am

## 2014-07-17 LAB — TYPE AND SCREEN
ABO/RH(D): O POS
Antibody Screen: NEGATIVE

## 2014-07-25 ENCOUNTER — Ambulatory Visit: Payer: Self-pay | Admitting: Orthopedic Surgery

## 2014-07-25 NOTE — Progress Notes (Signed)
Preoperative surgical orders have been place into the Epic hospital system for [facility] on 07/25/2014, 5:19 PM  by Mickel Crow for surgery on 08-09-2014.  Preop Total Knee orders including Experal, IV Tylenol, and IV Decadron as long as there are no contraindications to the above medications. Arlee Muslim, PA-C

## 2014-08-03 ENCOUNTER — Ambulatory Visit: Payer: Self-pay | Admitting: Orthopedic Surgery

## 2014-08-03 NOTE — H&P (Signed)
Delila Spence DOB: Jul 28, 1945 Divorced / Language: Cleophus Molt / Race: White Female Date of Admission:  08/09/2014 CC:  Right Knee Pain History of Present Illness (Alexzandrew L. Perkins III PA-C; 08/03/2014 5:37 PM) The patient is a 69 year old female who comes in today for a preoperative History and Physical. The patient is scheduled for a right total knee arthroplasty to be performed by Dr. Dione Plover. Aluisio, MD at [facility] on 08/09/2014.  It was originally scheduled for 5/23 but was rescheduled to 6/15/ The patient reports right knee symptoms including: pain and swelling which began year(s) ago without any known injury (She had a fall originally that caused a right mensical tear, causing her to have her first right knee scope, she believes it was around 2007-09. She had had a total of 3 right knee scopes, most recent one was in January, 2015 by Dr. Durward Fortes with [facility]. She had had buckling episodes and swelling on the knee. She has had Synvisc series and cortisone injections). The patient describes the severity of the symptoms as moderate in severity. The patient describes their pain as sharp, dull and aching.The patient feels that the symptoms are worsening. The patient is not currently being treated for this problem. She does not really want to conitnue with injections as she does not feel that they are very effective for her. She would like to have the knee replaced during the winter so that she is doing better once the weather warms up again. Unfortunately, her knee has gotten progressively worse over time. It's at a stage where it is hurting her at all times now. It's limiting what she can and cannot do. She occasionally gets swelling. She is having some associated hip pain at times. She's not having lower extremity weakness or paresthesias. The knee is keeping her awake at night. It's definitely limiting what she can and cannot do and having a negative effect on her  lifestyle. The left knee is not bothering her as much. She would like to proceed with knee sur gery at this time. They have been treated conservatively in the past for the above stated problem and despite conservative measures, they continue to have progressive pain and severe functional limitations and dysfunction. They have failed non-operative management including home exercise, medications, and injections. It is felt that they would benefit from undergoing total joint replacement. Risks and benefits of the procedure have been discussed with the patient and they elect to proceed with surgery. There are no active contraindications to surgery such as ongoing infection or rapidly progressive neurological disease.  Problem List/Past Medical  Fracture of right tibial plateau, closed, with routine healing, subsequent encounter (S82.141D) Osteoarthritis of right knee, unspecified osteoarthritis type (M17.9) Sciatica, left (M54.32) S/P hardware removal (Z98.89) Primary osteoarthritis of right knee (M17.11) Chronic Pain Fracture of right tibial plateau, closed, initial encounter (S82.141A) High blood pressure Gastroesophageal Reflux Disease Hypercholesterolemia Hiatal Hernia  Allergies No Known Drug Allergies  Family History  First Degree Relatives reported Drug / Alcohol Addiction Brother. Heart disease in female family member before age 32 Heart Disease Brother, Father. Cerebrovascular Accident Father. Cancer Brother, Sister. Diabetes Mellitus Father. Congestive Heart Failure Father, Mother. Osteoarthritis Mother. Osteoporosis Mother.  Social History  Exercise Exercises daily; does running / walking Living situation live alone Current work status retired Tobacco / smoke exposure 10/21/2013: no Tobacco use Never smoker. 10/21/2013 Children 1 Current drinker 10/21/2013: Currently drinks beer and wine only occasionally per week No history of drug/alcohol  rehab Not under pain contract Marital status divorced  Medication History Ibuprofen IB (Oral) Specific dose unknown - Active. Omeprazole (40MG  Capsule DR, Oral) Active. Aspirin Childrens (81MG  Tablet Chewable, Oral) Active. Calcium Citrate (200MG  Tablet, 1 (one) Oral) Active. Vitamin D (400UNIT Capsule, 1 (one) Oral) Active. Tylenol (325MG  Tablet, Oral) Active.  Past Surgical History  Tubal Ligation Gallbladder Surgery laporoscopic Arthroscopy of Knee right Appendectomy Ankle Surgery left ORIF Right Tibial Plateau Fracture Date: 11/2013.   Review of Systems  General Not Present- Chills, Fatigue, Fever, Memory Loss, Night Sweats, Weight Gain and Weight Loss. Skin Not Present- Eczema, Hives, Itching, Lesions and Rash. HEENT Not Present- Dentures, Double Vision, Headache, Hearing Loss, Tinnitus and Visual Loss. Respiratory Not Present- Allergies, Chronic Cough, Coughing up blood, Shortness of breath at rest and Shortness of breath with exertion. Cardiovascular Not Present- Chest Pain, Difficulty Breathing Lying Down, Murmur, Palpitations, Racing/skipping heartbeats and Swelling. Gastrointestinal Not Present- Abdominal Pain, Bloody Stool, Constipation, Diarrhea, Difficulty Swallowing, Heartburn, Jaundice, Loss of appetitie, Nausea and Vomiting. Female Genitourinary Not Present- Blood in Urine, Discharge, Flank Pain, Incontinence, Painful Urination, Urgency, Urinary frequency, Urinary Retention, Urinating at Night and Weak urinary stream. Musculoskeletal Present- Joint Pain, Joint Swelling and Morning Stiffness. Not Present- Back Pain, Muscle Pain, Muscle Weakness and Spasms. Neurological Not Present- Blackout spells, Difficulty with balance, Dizziness, Paralysis, Tremor and Weakness. Psychiatric Not Present- Insomnia.  Vitals  Weight: 181 lb Height: 64in Weight was reported by patient. Height was reported by patient. Body Surface Area: 1.87 m Body Mass Index:  31.07 kg/m  BP: 156/94 (Sitting, Right Arm, Standard)  Physical Exam General Mental Status -Alert, cooperative and good historian. General Appearance-pleasant, Not in acute distress. Orientation-Oriented X3. Build & Nutrition-Well nourished and Well developed.  Head and Neck Head-normocephalic, atraumatic . Neck Global Assessment - supple, no bruit auscultated on the right, no bruit auscultated on the left.  Eye Vision-Wears corrective lenses. Pupil - Bilateral-Regular and Round. Motion - Bilateral-EOMI.  Chest and Lung Exam Auscultation Breath sounds - clear at anterior chest wall and clear at posterior chest wall. Adventitious sounds - No Adventitious sounds.  Cardiovascular Auscultation Rhythm - Regular rate and rhythm. Heart Sounds - S1 WNL and S2 WNL. Murmurs & Other Heart Sounds - Auscultation of the heart reveals - No Murmurs.  Abdomen Palpation/Percussion Tenderness - Abdomen is non-tender to palpation. Rigidity (guarding) - Abdomen is soft. Auscultation Auscultation of the abdomen reveals - Bowel sounds normal.  Female Genitourinary Note: Not done, not pertinent to present illness   Musculoskeletal Note: On exam, a well-developed female in no distress. Her right leg incision is well-healed with no erythema or exudate. There is minimal swelling. Her knee range of motion is about 0 to 115. She is not tender out laterally. There is no instability about the knee.  Assessment & Plan  Primary osteoarthritis of right knee (M17.11) Note:Surgical Plans: Right Total Knee Replacement  Disposition: Home  PCP: Dr. Charleston Poot - Patient has been seen preoperatively and felt to be stable for surgery.  IV TXA  Anesthesia Issues: None  Signed electronically by Joelene Millin, III PA-C

## 2014-08-09 ENCOUNTER — Inpatient Hospital Stay (HOSPITAL_COMMUNITY): Payer: Medicare Other | Admitting: Certified Registered"

## 2014-08-09 ENCOUNTER — Encounter (HOSPITAL_COMMUNITY): Payer: Self-pay | Admitting: *Deleted

## 2014-08-09 ENCOUNTER — Inpatient Hospital Stay (HOSPITAL_COMMUNITY)
Admission: RE | Admit: 2014-08-09 | Discharge: 2014-08-11 | DRG: 470 | Disposition: A | Discharge status: Home Health | Payer: Medicare Other | Source: Ambulatory Visit | Attending: Orthopedic Surgery | Admitting: Orthopedic Surgery

## 2014-08-09 ENCOUNTER — Encounter (HOSPITAL_COMMUNITY)
Admission: RE | Disposition: A | Discharge status: Home Health | Payer: Self-pay | Source: Ambulatory Visit | Attending: Orthopedic Surgery

## 2014-08-09 DIAGNOSIS — Z79899 Other long term (current) drug therapy: Secondary | ICD-10-CM | POA: Diagnosis not present

## 2014-08-09 DIAGNOSIS — I10 Essential (primary) hypertension: Secondary | ICD-10-CM | POA: Diagnosis not present

## 2014-08-09 DIAGNOSIS — Z7982 Long term (current) use of aspirin: Secondary | ICD-10-CM | POA: Diagnosis not present

## 2014-08-09 DIAGNOSIS — Z01812 Encounter for preprocedural laboratory examination: Secondary | ICD-10-CM | POA: Diagnosis not present

## 2014-08-09 DIAGNOSIS — M179 Osteoarthritis of knee, unspecified: Secondary | ICD-10-CM | POA: Diagnosis present

## 2014-08-09 DIAGNOSIS — M1710 Unilateral primary osteoarthritis, unspecified knee: Secondary | ICD-10-CM | POA: Diagnosis present

## 2014-08-09 DIAGNOSIS — K449 Diaphragmatic hernia without obstruction or gangrene: Secondary | ICD-10-CM | POA: Diagnosis present

## 2014-08-09 DIAGNOSIS — E780 Pure hypercholesterolemia: Secondary | ICD-10-CM | POA: Diagnosis not present

## 2014-08-09 DIAGNOSIS — G8929 Other chronic pain: Secondary | ICD-10-CM | POA: Diagnosis present

## 2014-08-09 DIAGNOSIS — M1711 Unilateral primary osteoarthritis, right knee: Secondary | ICD-10-CM | POA: Diagnosis not present

## 2014-08-09 DIAGNOSIS — K219 Gastro-esophageal reflux disease without esophagitis: Secondary | ICD-10-CM | POA: Diagnosis not present

## 2014-08-09 DIAGNOSIS — M25561 Pain in right knee: Secondary | ICD-10-CM | POA: Diagnosis present

## 2014-08-09 HISTORY — PX: TOTAL KNEE ARTHROPLASTY: SHX125

## 2014-08-09 LAB — APTT: aPTT: 63 seconds — ABNORMAL HIGH (ref 24–37)

## 2014-08-09 LAB — TYPE AND SCREEN
ABO/RH(D): O POS
Antibody Screen: NEGATIVE

## 2014-08-09 LAB — PROTIME-INR
INR: 1.04 (ref 0.00–1.49)
Prothrombin Time: 13.8 seconds (ref 11.6–15.2)

## 2014-08-09 SURGERY — ARTHROPLASTY, KNEE, TOTAL
Anesthesia: General | Laterality: Right

## 2014-08-09 MED ORDER — MIDAZOLAM HCL 5 MG/5ML IJ SOLN
INTRAMUSCULAR | Status: DC | PRN
  Administered 2014-08-09: 2 mg via INTRAVENOUS

## 2014-08-09 MED ORDER — OXYCODONE HCL 5 MG PO TABS
5.0000 mg | ORAL_TABLET | ORAL | Status: DC | PRN
  Administered 2014-08-09 – 2014-08-11 (×10): 10 mg via ORAL
  Filled 2014-08-09 (×10): qty 2

## 2014-08-09 MED ORDER — HYDROMORPHONE HCL 1 MG/ML IJ SOLN
INTRAMUSCULAR | Status: AC
  Filled 2014-08-09: qty 1

## 2014-08-09 MED ORDER — PROPOFOL 10 MG/ML IV BOLUS
INTRAVENOUS | Status: AC
  Filled 2014-08-09: qty 20

## 2014-08-09 MED ORDER — PHENOL 1.4 % MT LIQD: 1.0000 | OROMUCOSAL | Status: DC | PRN

## 2014-08-09 MED ORDER — BUPIVACAINE HCL 0.25 % IJ SOLN
INTRAMUSCULAR | Status: AC
  Filled 2014-08-09: qty 1

## 2014-08-09 MED ORDER — HYDROMORPHONE HCL 1 MG/ML IJ SOLN
INTRAMUSCULAR | Status: DC | PRN
  Administered 2014-08-09 (×2): 1 mg via INTRAVENOUS

## 2014-08-09 MED ORDER — ESOMEPRAZOLE MAGNESIUM 40 MG PO CPDR
40.0000 mg | DELAYED_RELEASE_CAPSULE | Freq: Every day | ORAL | Status: DC
  Administered 2014-08-10 – 2014-08-11 (×2): 40 mg via ORAL
  Filled 2014-08-09 (×3): qty 1

## 2014-08-09 MED ORDER — DEXAMETHASONE SODIUM PHOSPHATE 10 MG/ML IJ SOLN
INTRAMUSCULAR | Status: AC
Start: 2014-08-09 — End: 2014-08-09
  Filled 2014-08-09: qty 1

## 2014-08-09 MED ORDER — SODIUM CHLORIDE 0.9 % IR SOLN
Status: DC | PRN
  Administered 2014-08-09: 1000 mL

## 2014-08-09 MED ORDER — METHOCARBAMOL 500 MG PO TABS
500.0000 mg | ORAL_TABLET | Freq: Four times a day (QID) | ORAL | Status: DC | PRN
  Administered 2014-08-10 – 2014-08-11 (×4): 500 mg via ORAL
  Filled 2014-08-09 (×4): qty 1

## 2014-08-09 MED ORDER — FENTANYL CITRATE (PF) 100 MCG/2ML IJ SOLN
25.0000 ug | INTRAMUSCULAR | Status: DC | PRN
  Administered 2014-08-09: 25 ug via INTRAVENOUS
  Administered 2014-08-09: 50 ug via INTRAVENOUS
  Administered 2014-08-09: 25 ug via INTRAVENOUS

## 2014-08-09 MED ORDER — LIDOCAINE HCL (CARDIAC) 20 MG/ML IV SOLN
INTRAVENOUS | Status: AC
  Filled 2014-08-09: qty 5

## 2014-08-09 MED ORDER — LIDOCAINE HCL (CARDIAC) 20 MG/ML IV SOLN
INTRAVENOUS | Status: DC | PRN
  Administered 2014-08-09: 100 mg via INTRAVENOUS

## 2014-08-09 MED ORDER — SODIUM CHLORIDE 0.9 % IJ SOLN
INTRAMUSCULAR | Status: AC
  Filled 2014-08-09: qty 10

## 2014-08-09 MED ORDER — GLYCOPYRROLATE 0.2 MG/ML IJ SOLN
INTRAMUSCULAR | Status: AC
  Filled 2014-08-09: qty 2

## 2014-08-09 MED ORDER — FENTANYL CITRATE (PF) 100 MCG/2ML IJ SOLN
INTRAMUSCULAR | Status: DC | PRN
  Administered 2014-08-09 (×4): 50 ug via INTRAVENOUS

## 2014-08-09 MED ORDER — METHOCARBAMOL 1000 MG/10ML IJ SOLN
500.0000 mg | Freq: Four times a day (QID) | INTRAVENOUS | Status: DC | PRN
  Administered 2014-08-09: 500 mg via INTRAVENOUS
  Filled 2014-08-09 (×2): qty 5

## 2014-08-09 MED ORDER — ACETAMINOPHEN 500 MG PO TABS
1000.0000 mg | ORAL_TABLET | Freq: Four times a day (QID) | ORAL | Status: AC
  Administered 2014-08-09 – 2014-08-10 (×4): 1000 mg via ORAL
  Filled 2014-08-09 (×4): qty 2

## 2014-08-09 MED ORDER — KETOROLAC TROMETHAMINE 15 MG/ML IJ SOLN
7.5000 mg | Freq: Four times a day (QID) | INTRAMUSCULAR | Status: AC | PRN
  Administered 2014-08-10: 7.5 mg via INTRAVENOUS
  Filled 2014-08-09: qty 1

## 2014-08-09 MED ORDER — METOCLOPRAMIDE HCL 5 MG/ML IJ SOLN
5.0000 mg | Freq: Three times a day (TID) | INTRAMUSCULAR | Status: DC | PRN
Start: 2014-08-09 — End: 2014-08-11

## 2014-08-09 MED ORDER — FENTANYL CITRATE (PF) 100 MCG/2ML IJ SOLN
INTRAMUSCULAR | Status: AC
Start: 2014-08-09 — End: 2014-08-09
  Filled 2014-08-09: qty 2

## 2014-08-09 MED ORDER — SODIUM CHLORIDE 0.9 % IJ SOLN
INTRAMUSCULAR | Status: AC
  Filled 2014-08-09: qty 50

## 2014-08-09 MED ORDER — MENTHOL 3 MG MT LOZG: 1.0000 | LOZENGE | OROMUCOSAL | Status: DC | PRN

## 2014-08-09 MED ORDER — BUPIVACAINE LIPOSOME 1.3 % IJ SUSP
INTRAMUSCULAR | Status: DC | PRN
  Administered 2014-08-09: 20 mL

## 2014-08-09 MED ORDER — OXYCODONE HCL 5 MG PO TABS: 5.0000 mg | ORAL_TABLET | Freq: Once | ORAL | Status: DC | PRN

## 2014-08-09 MED ORDER — FENTANYL CITRATE (PF) 100 MCG/2ML IJ SOLN
INTRAMUSCULAR | Status: AC
  Filled 2014-08-09: qty 2

## 2014-08-09 MED ORDER — HYDROMORPHONE HCL 1 MG/ML IJ SOLN
0.2500 mg | INTRAMUSCULAR | Status: DC | PRN
  Administered 2014-08-09 (×2): 0.5 mg via INTRAVENOUS

## 2014-08-09 MED ORDER — NEOSTIGMINE METHYLSULFATE 10 MG/10ML IV SOLN
INTRAVENOUS | Status: DC | PRN
  Administered 2014-08-09: 3 mg via INTRAVENOUS

## 2014-08-09 MED ORDER — ACETAMINOPHEN 10 MG/ML IV SOLN
1000.0000 mg | Freq: Once | INTRAVENOUS | Status: AC
  Administered 2014-08-09: 1000 mg via INTRAVENOUS
  Filled 2014-08-09: qty 100

## 2014-08-09 MED ORDER — LORATADINE 10 MG PO TABS
10.0000 mg | ORAL_TABLET | Freq: Every day | ORAL | Status: DC | PRN
  Filled 2014-08-09: qty 1

## 2014-08-09 MED ORDER — CEFAZOLIN SODIUM-DEXTROSE 2-3 GM-% IV SOLR
2.0000 g | INTRAVENOUS | Status: AC
  Administered 2014-08-09: 2 g via INTRAVENOUS

## 2014-08-09 MED ORDER — SODIUM CHLORIDE 0.9 % IV SOLN: INTRAVENOUS | Status: DC

## 2014-08-09 MED ORDER — FLEET ENEMA 7-19 GM/118ML RE ENEM: 1.0000 | ENEMA | Freq: Once | RECTAL | Status: AC | PRN

## 2014-08-09 MED ORDER — 0.9 % SODIUM CHLORIDE (POUR BTL) OPTIME
TOPICAL | Status: DC | PRN
  Administered 2014-08-09: 1000 mL

## 2014-08-09 MED ORDER — ROCURONIUM BROMIDE 100 MG/10ML IV SOLN
INTRAVENOUS | Status: AC
Start: 2014-08-09 — End: 2014-08-09
  Filled 2014-08-09: qty 1

## 2014-08-09 MED ORDER — ACETAMINOPHEN 325 MG PO TABS
650.0000 mg | ORAL_TABLET | Freq: Four times a day (QID) | ORAL | Status: DC | PRN
Start: 2014-08-10 — End: 2014-08-11
  Administered 2014-08-11: 650 mg via ORAL
  Filled 2014-08-09: qty 2

## 2014-08-09 MED ORDER — ONDANSETRON HCL 4 MG/2ML IJ SOLN
INTRAMUSCULAR | Status: DC | PRN
  Administered 2014-08-09: 4 mg via INTRAVENOUS

## 2014-08-09 MED ORDER — ROCURONIUM BROMIDE 100 MG/10ML IV SOLN
INTRAVENOUS | Status: DC | PRN
  Administered 2014-08-09: 20 mg via INTRAVENOUS

## 2014-08-09 MED ORDER — GLYCOPYRROLATE 0.2 MG/ML IJ SOLN
INTRAMUSCULAR | Status: DC | PRN
  Administered 2014-08-09: 0.4 mg via INTRAVENOUS

## 2014-08-09 MED ORDER — MIDAZOLAM HCL 2 MG/2ML IJ SOLN
INTRAMUSCULAR | Status: AC
Start: 2014-08-09 — End: 2014-08-09
  Filled 2014-08-09: qty 2

## 2014-08-09 MED ORDER — ACETAMINOPHEN 10 MG/ML IV SOLN
INTRAVENOUS | Status: AC
  Filled 2014-08-09: qty 100

## 2014-08-09 MED ORDER — SODIUM CHLORIDE 0.9 % IJ SOLN
INTRAMUSCULAR | Status: DC | PRN
  Administered 2014-08-09: 50 mL

## 2014-08-09 MED ORDER — ONDANSETRON HCL 4 MG PO TABS: 4.0000 mg | ORAL_TABLET | Freq: Four times a day (QID) | ORAL | Status: DC | PRN

## 2014-08-09 MED ORDER — CHLORHEXIDINE GLUCONATE 4 % EX LIQD: 60.0000 mL | Freq: Once | CUTANEOUS | Status: DC

## 2014-08-09 MED ORDER — SODIUM CHLORIDE 0.9 % IV SOLN
INTRAVENOUS | Status: DC
  Administered 2014-08-10: 05:00:00 via INTRAVENOUS

## 2014-08-09 MED ORDER — PROPOFOL 10 MG/ML IV BOLUS
INTRAVENOUS | Status: DC | PRN
  Administered 2014-08-09: 200 mg via INTRAVENOUS

## 2014-08-09 MED ORDER — SUCCINYLCHOLINE CHLORIDE 20 MG/ML IJ SOLN
INTRAMUSCULAR | Status: DC | PRN
  Administered 2014-08-09: 100 mg via INTRAVENOUS

## 2014-08-09 MED ORDER — DEXAMETHASONE SODIUM PHOSPHATE 10 MG/ML IJ SOLN
10.0000 mg | Freq: Once | INTRAMUSCULAR | Status: AC
  Administered 2014-08-10: 10 mg via INTRAVENOUS
  Filled 2014-08-09: qty 1

## 2014-08-09 MED ORDER — ONDANSETRON HCL 4 MG/2ML IJ SOLN: 4.0000 mg | Freq: Four times a day (QID) | INTRAMUSCULAR | Status: DC | PRN

## 2014-08-09 MED ORDER — TRAMADOL HCL 50 MG PO TABS
50.0000 mg | ORAL_TABLET | Freq: Four times a day (QID) | ORAL | Status: DC | PRN
  Filled 2014-08-09: qty 1

## 2014-08-09 MED ORDER — EPHEDRINE SULFATE 50 MG/ML IJ SOLN
INTRAMUSCULAR | Status: DC | PRN
  Administered 2014-08-09: 10 mg via INTRAVENOUS

## 2014-08-09 MED ORDER — ONDANSETRON HCL 4 MG/2ML IJ SOLN
INTRAMUSCULAR | Status: AC
  Filled 2014-08-09: qty 2

## 2014-08-09 MED ORDER — DIPHENHYDRAMINE HCL 12.5 MG/5ML PO ELIX: 12.5000 mg | ORAL_SOLUTION | ORAL | Status: DC | PRN

## 2014-08-09 MED ORDER — MORPHINE SULFATE 2 MG/ML IJ SOLN
1.0000 mg | INTRAMUSCULAR | Status: DC | PRN
  Administered 2014-08-09 – 2014-08-10 (×2): 2 mg via INTRAVENOUS
  Filled 2014-08-09 (×3): qty 1

## 2014-08-09 MED ORDER — EPHEDRINE SULFATE 50 MG/ML IJ SOLN
INTRAMUSCULAR | Status: AC
  Filled 2014-08-09: qty 1

## 2014-08-09 MED ORDER — AMLODIPINE BESYLATE 10 MG PO TABS
10.0000 mg | ORAL_TABLET | Freq: Every morning | ORAL | Status: DC
  Administered 2014-08-10: 10 mg via ORAL
  Filled 2014-08-09 (×2): qty 1

## 2014-08-09 MED ORDER — HYDROMORPHONE HCL 1 MG/ML IJ SOLN
0.2500 mg | INTRAMUSCULAR | Status: DC | PRN
  Administered 2014-08-09: 0.25 mg via INTRAVENOUS
  Administered 2014-08-09 (×4): 0.5 mg via INTRAVENOUS
  Administered 2014-08-09: 0.25 mg via INTRAVENOUS

## 2014-08-09 MED ORDER — DOCUSATE SODIUM 100 MG PO CAPS
100.0000 mg | ORAL_CAPSULE | Freq: Two times a day (BID) | ORAL | Status: DC
  Administered 2014-08-09 – 2014-08-11 (×4): 100 mg via ORAL

## 2014-08-09 MED ORDER — TRANEXAMIC ACID 1000 MG/10ML IV SOLN
1000.0000 mg | INTRAVENOUS | Status: AC
  Administered 2014-08-09: 1000 mg via INTRAVENOUS
  Filled 2014-08-09: qty 10

## 2014-08-09 MED ORDER — OXYCODONE HCL 5 MG/5ML PO SOLN
5.0000 mg | Freq: Once | ORAL | Status: DC | PRN
  Filled 2014-08-09: qty 5

## 2014-08-09 MED ORDER — BISACODYL 10 MG RE SUPP: 10.0000 mg | Freq: Every day | RECTAL | Status: DC | PRN

## 2014-08-09 MED ORDER — DEXAMETHASONE SODIUM PHOSPHATE 10 MG/ML IJ SOLN
10.0000 mg | Freq: Once | INTRAMUSCULAR | Status: AC
  Administered 2014-08-09: 10 mg via INTRAVENOUS

## 2014-08-09 MED ORDER — LACTATED RINGERS IV SOLN
INTRAVENOUS | Status: DC
  Administered 2014-08-09: 12:00:00 via INTRAVENOUS
  Administered 2014-08-09: 1000 mL via INTRAVENOUS

## 2014-08-09 MED ORDER — CEFAZOLIN SODIUM-DEXTROSE 2-3 GM-% IV SOLR
2.0000 g | Freq: Four times a day (QID) | INTRAVENOUS | Status: AC
  Administered 2014-08-09 – 2014-08-10 (×2): 2 g via INTRAVENOUS
  Filled 2014-08-09 (×2): qty 50

## 2014-08-09 MED ORDER — METOCLOPRAMIDE HCL 10 MG PO TABS: 5.0000 mg | ORAL_TABLET | Freq: Three times a day (TID) | ORAL | Status: DC | PRN

## 2014-08-09 MED ORDER — EZETIMIBE-SIMVASTATIN 10-40 MG PO TABS
1.0000 | ORAL_TABLET | Freq: Every day | ORAL | Status: DC
  Administered 2014-08-09: 1 via ORAL
  Filled 2014-08-09 (×2): qty 1

## 2014-08-09 MED ORDER — CEFAZOLIN SODIUM-DEXTROSE 2-3 GM-% IV SOLR
INTRAVENOUS | Status: AC
  Filled 2014-08-09: qty 50

## 2014-08-09 MED ORDER — HYDROMORPHONE HCL 2 MG/ML IJ SOLN
INTRAMUSCULAR | Status: AC
  Filled 2014-08-09: qty 1

## 2014-08-09 MED ORDER — RIVAROXABAN 10 MG PO TABS
10.0000 mg | ORAL_TABLET | Freq: Every day | ORAL | Status: DC
  Administered 2014-08-10 – 2014-08-11 (×2): 10 mg via ORAL
  Filled 2014-08-09 (×3): qty 1

## 2014-08-09 MED ORDER — BUPIVACAINE LIPOSOME 1.3 % IJ SUSP
20.0000 mL | Freq: Once | INTRAMUSCULAR | Status: DC
  Filled 2014-08-09: qty 20

## 2014-08-09 MED ORDER — PROPOFOL INFUSION 10 MG/ML OPTIME: INTRAVENOUS | Status: DC | PRN

## 2014-08-09 MED ORDER — BUPIVACAINE HCL 0.25 % IJ SOLN
INTRAMUSCULAR | Status: DC | PRN
Start: 2014-08-09 — End: 2014-08-09
  Administered 2014-08-09: 30 mL

## 2014-08-09 MED ORDER — ACETAMINOPHEN 650 MG RE SUPP: 650.0000 mg | Freq: Four times a day (QID) | RECTAL | Status: DC | PRN

## 2014-08-09 MED ORDER — POLYETHYLENE GLYCOL 3350 17 G PO PACK: 17.0000 g | PACK | Freq: Every day | ORAL | Status: DC | PRN

## 2014-08-09 SURGICAL SUPPLY — 64 items

## 2014-08-09 NOTE — Interval H&P Note (Signed)
History and Physical Interval Note:  08/09/2014 8:37 AM  [name]  has presented today for surgery, with the diagnosis of OA RIGHT KNEE  The various methods of treatment have been discussed with the patient and family. After consideration of risks, benefits and other options for treatment, the patient has consented to  Procedure(s): RIGHT TOTAL KNEE ARTHROPLASTY (Right) as a surgical intervention .  The patient's history has been reviewed, patient examined, no change in status, stable for surgery.  I have reviewed the patient's chart and labs.  Questions were answered to the patient's satisfaction.     [name]

## 2014-08-09 NOTE — Transfer of Care (Signed)
Immediate Anesthesia Transfer of Care Note  Patient: Shaquan W Bonds  Procedure(s) Performed: Procedure(s): RIGHT TOTAL KNEE ARTHROPLASTY (Right)  Patient Location: PACU  Anesthesia Type:General  Level of Consciousness: awake, sedated and patient cooperative  Airway & Oxygen Therapy: Patient Spontanous Breathing and Patient connected to face mask oxygen  Post-op Assessment: Report given to RN and Post -op Vital signs reviewed and stable  Post vital signs: Reviewed and stable  Last Vitals:  Filed Vitals:   08/09/14 0755  BP: 130/88  Pulse: 86  Temp: 36.6 C  Resp: 18    Complications: No apparent anesthesia complications

## 2014-08-09 NOTE — Anesthesia Procedure Notes (Signed)
Procedure Name: Intubation Date/Time: 08/09/2014 10:35 AM Performed by: Maxwell Caul Pre-anesthesia Checklist: Patient identified, Emergency Drugs available, Suction available and Patient being monitored Patient Re-evaluated:Patient Re-evaluated prior to inductionOxygen Delivery Method: Circle System Utilized Preoxygenation: Pre-oxygenation with 100% oxygen Intubation Type: IV induction Ventilation: Mask ventilation without difficulty Laryngoscope Size: Mac and 4 Grade View: Grade II Tube type: Oral Tube size: 7.0 mm Number of attempts: 1 Airway Equipment and Method: Stylet and Oral airway Placement Confirmation: ETT inserted through vocal cords under direct vision,  positive ETCO2 and breath sounds checked- equal and bilateral Secured at: 21 cm Tube secured with: Tape Dental Injury: Teeth and Oropharynx as per pre-operative assessment

## 2014-08-09 NOTE — Op Note (Signed)
Pre-operative diagnosis- Osteoarthritis  Right knee(s)  Post-operative diagnosis- Osteoarthritis Right knee(s)  Procedure-  Right  Total Knee Arthroplasty  Surgeon- Dione Plover. Brandy Zuba, MD  Assistant- Molli Barrows, PA-C   Anesthesia-  General  EBL-* No blood loss amount entered *   Drains Hemovac  Tourniquet time-  Total Tourniquet Time Documented: Thigh (Right) - 36 minutes Total: Thigh (Right) - 36 minutes     Complications- None  Condition-PACU - hemodynamically stable.   Brief Clinical Note  [name] is a 69 y.o. year old female with end stage OA of her right knee with progressively worsening pain and dysfunction. She has constant pain, with activity and at rest and significant functional deficits with difficulties even with ADLs. She has had extensive non-op management including analgesics, injections of cortisone and viscosupplements, and home exercise program, but remains in significant pain with significant dysfunction.Radiographs show bone on bone arthritis medial and patellofemoral. She also had a recent lateral tibial plateau fracture. She presents now for right Total Knee Arthroplasty.    Procedure in detail---   The patient is brought into the operating room and positioned supine on the operating table. After successful administration of  General,   a tourniquet is placed high on the  Right thigh(s) and the lower extremity is prepped and draped in the usual sterile fashion. Time out is performed by the operating team and then the  Right lower extremity is wrapped in Esmarch, knee flexed and the tourniquet inflated to 300 mmHg.       A midline incision is made with a ten blade through the subcutaneous tissue to the level of the extensor mechanism. A fresh blade is used to make a medial parapatellar arthrotomy. Soft tissue over the proximal medial tibia is subperiosteally elevated to the joint line with a knife and into the semimembranosus bursa with a Cobb elevator.  Soft tissue over the proximal lateral tibia is elevated with attention being paid to avoiding the patellar tendon on the tibial tubercle. The patella is everted, knee flexed 90 degrees and the ACL and PCL are removed. Findings are bone on bone medial and patellofemoral with large global osteophytes.        The drill is used to create a starting hole in the distal femur and the canal is thoroughly irrigated with sterile saline to remove the fatty contents. The 5 degree Right  valgus alignment guide is placed into the femoral canal and the distal femoral cutting block is pinned to remove 10 mm off the distal femur. Resection is made with an oscillating saw.      The tibia is subluxed forward and the menisci are removed. The extramedullary alignment guide is placed referencing proximally at the medial aspect of the tibial tubercle and distally along the second metatarsal axis and tibial crest. The block is pinned to remove 47mm off the more deficient medial  side. Resection is made with an oscillating saw. Size 2.5is the most appropriate size for the tibia and the proximal tibia is prepared with the modular drill and keel punch for that size.      The femoral sizing guide is placed and size 3 is most appropriate. Rotation is marked off the epicondylar axis and confirmed by creating a rectangular flexion gap at 90 degrees. The size 3 cutting block is pinned in this rotation and the anterior, posterior and chamfer cuts are made with the oscillating saw. The intercondylar block is then placed and that cut is made.  Trial size 2.5 tibial component, trial size 3 posterior stabilized femur and a 10  mm posterior stabilized rotating platform insert trial is placed. Full extension is achieved with excellent varus/valgus and anterior/posterior balance throughout full range of motion. The patella is everted and thickness measured to be 22  mm. Free hand resection is taken to 12 mm, a 35 template is placed, lug holes are  drilled, trial patella is placed, and it tracks normally. Osteophytes are removed off the posterior femur with the trial in place. All trials are removed and the cut bone surfaces prepared with pulsatile lavage. Cement is mixed and once ready for implantation, the size 2.5 MBT revision tibial implant (used due to previous lateral tibial plateau fracture), size  3 posterior stabilized femoral component, and the size 35 patella are cemented in place and the patella is held with the clamp. The trial insert is placed and the knee held in full extension. The Exparel (20 ml mixed with 30 ml saline) and .25% Bupivicaine, are injected into the extensor mechanism, posterior capsule, medial and lateral gutters and subcutaneous tissues.  All extruded cement is removed and once the cement is hard the permanent 10 mm posterior stabilized rotating platform insert is placed into the tibial tray.      The wound is copiously irrigated with saline solution and the extensor mechanism closed over a hemovac drain with #1 V-loc suture. The tourniquet is released for a total tourniquet time of 34  minutes. Flexion against gravity is 140 degrees and the patella tracks normally. Subcutaneous tissue is closed with 2.0 vicryl and subcuticular with running 4.0 Monocryl. The incision is cleaned and dried and steri-strips and a bulky sterile dressing are applied. The limb is placed into a knee immobilizer and the patient is awakened and transported to recovery in stable condition.      Please note that a surgical assistant was a medical necessity for this procedure in order to perform it in a safe and expeditious manner. Surgical assistant was necessary to retract the ligaments and vital neurovascular structures to prevent injury to them and also necessary for proper positioning of the limb to allow for anatomic placement of the prosthesis.   Dione Plover Nastashia Gallo, MD    08/09/2014, 11:46 AM

## 2014-08-09 NOTE — Anesthesia Preprocedure Evaluation (Addendum)
Anesthesia Evaluation  Patient identified by MRN, date of birth, ID band Patient awake    Reviewed: Allergy & Precautions, H&P , NPO status , Patient's Chart, lab work & pertinent test results  Airway Mallampati: II   Neck ROM: full    Dental   Pulmonary  breath sounds clear to auscultation        Cardiovascular hypertension, Rhythm:regular Rate:Normal     Neuro/Psych  Neuromuscular disease    GI/Hepatic hiatal hernia, GERD-  ,  Endo/Other  obese  Renal/GU      Musculoskeletal  (+) Arthritis -,   Abdominal   Peds  Hematology   Anesthesia Other Findings   Reproductive/Obstetrics                            Anesthesia Physical Anesthesia Plan  ASA: II  Anesthesia Plan: General   Post-op Pain Management:    Induction: Intravenous  Airway Management Planned: Oral ETT  Additional Equipment:   Intra-op Plan:   Post-operative Plan: Extubation in OR  Informed Consent: I have reviewed the patients History and Physical, chart, labs and discussed the procedure including the risks, benefits and alternatives for the proposed anesthesia with the patient or authorized representative who has indicated his/her understanding and acceptance.     Plan Discussed with: CRNA, Anesthesiologist and Surgeon  Anesthesia Plan Comments:        Anesthesia Quick Evaluation

## 2014-08-09 NOTE — Anesthesia Postprocedure Evaluation (Signed)
Anesthesia Post Note  Patient: [name]  Procedure(s) Performed: Procedure(s) (LRB): RIGHT TOTAL KNEE ARTHROPLASTY (Right)  Anesthesia type: General  Patient location: PACU  Post pain: Pain level controlled and Adequate analgesia  Post assessment: Post-op Vital signs reviewed, Patient's Cardiovascular Status Stable, Respiratory Function Stable, Patent Airway and Pain level controlled  Last Vitals:  Filed Vitals:   08/09/14 1315  BP: 119/60  Pulse: 92  Temp:   Resp: 15    Post vital signs: Reviewed and stable  Level of consciousness: awake, alert  and oriented  Complications: No apparent anesthesia complications

## 2014-08-09 NOTE — H&P (View-Only) (Signed)
[name] DOB: 1945/04/27 Divorced / Language: Cleophus Molt / Race: White Female Date of Admission:  08/09/2014 CC:  Right Knee Pain History of Present Illness (Alexzandrew L. Perkins III PA-C; 08/03/2014 5:37 PM) The patient is a 69 year old female who comes in today for a preoperative History and Physical. The patient is scheduled for a right total knee arthroplasty to be performed by Dr. Dione Plover. Aluisio, MD at [facility] on 08/09/2014.  It was originally scheduled for 5/23 but was rescheduled to 6/15/ The patient reports right knee symptoms including: pain and swelling which began year(s) ago without any known injury (She had a fall originally that caused a right mensical tear, causing her to have her first right knee scope, she believes it was around 2007-09. She had had a total of 3 right knee scopes, most recent one was in January, 2015 by Dr. Durward Fortes with [facility]. She had had buckling episodes and swelling on the knee. She has had Synvisc series and cortisone injections). The patient describes the severity of the symptoms as moderate in severity. The patient describes their pain as sharp, dull and aching.The patient feels that the symptoms are worsening. The patient is not currently being treated for this problem. She does not really want to conitnue with injections as she does not feel that they are very effective for her. She would like to have the knee replaced during the winter so that she is doing better once the weather warms up again. Unfortunately, her knee has gotten progressively worse over time. It's at a stage where it is hurting her at all times now. It's limiting what she can and cannot do. She occasionally gets swelling. She is having some associated hip pain at times. She's not having lower extremity weakness or paresthesias. The knee is keeping her awake at night. It's definitely limiting what she can and cannot do and having a negative effect on her  lifestyle. The left knee is not bothering her as much. She would like to proceed with knee sur gery at this time. They have been treated conservatively in the past for the above stated problem and despite conservative measures, they continue to have progressive pain and severe functional limitations and dysfunction. They have failed non-operative management including home exercise, medications, and injections. It is felt that they would benefit from undergoing total joint replacement. Risks and benefits of the procedure have been discussed with the patient and they elect to proceed with surgery. There are no active contraindications to surgery such as ongoing infection or rapidly progressive neurological disease.  Problem List/Past Medical  Fracture of right tibial plateau, closed, with routine healing, subsequent encounter (S82.141D) Osteoarthritis of right knee, unspecified osteoarthritis type (M17.9) Sciatica, left (M54.32) S/P hardware removal (Z98.89) Primary osteoarthritis of right knee (M17.11) Chronic Pain Fracture of right tibial plateau, closed, initial encounter (S82.141A) High blood pressure Gastroesophageal Reflux Disease Hypercholesterolemia Hiatal Hernia  Allergies No Known Drug Allergies  Family History  First Degree Relatives reported Drug / Alcohol Addiction Brother. Heart disease in female family member before age 73 Heart Disease Brother, Father. Cerebrovascular Accident Father. Cancer Brother, Sister. Diabetes Mellitus Father. Congestive Heart Failure Father, Mother. Osteoarthritis Mother. Osteoporosis Mother.  Social History  Exercise Exercises daily; does running / walking Living situation live alone Current work status retired Tobacco / smoke exposure 10/21/2013: no Tobacco use Never smoker. 10/21/2013 Children 1 Current drinker 10/21/2013: Currently drinks beer and wine only occasionally per week No history of drug/alcohol  rehab Not under pain contract Marital status divorced  Medication History Ibuprofen IB (Oral) Specific dose unknown - Active. Omeprazole (40MG  Capsule DR, Oral) Active. Aspirin Childrens (81MG  Tablet Chewable, Oral) Active. Calcium Citrate (200MG  Tablet, 1 (one) Oral) Active. Vitamin D (400UNIT Capsule, 1 (one) Oral) Active. Tylenol (325MG  Tablet, Oral) Active.  Past Surgical History  Tubal Ligation Gallbladder Surgery laporoscopic Arthroscopy of Knee right Appendectomy Ankle Surgery left ORIF Right Tibial Plateau Fracture Date: 11/2013.   Review of Systems  General Not Present- Chills, Fatigue, Fever, Memory Loss, Night Sweats, Weight Gain and Weight Loss. Skin Not Present- Eczema, Hives, Itching, Lesions and Rash. HEENT Not Present- Dentures, Double Vision, Headache, Hearing Loss, Tinnitus and Visual Loss. Respiratory Not Present- Allergies, Chronic Cough, Coughing up blood, Shortness of breath at rest and Shortness of breath with exertion. Cardiovascular Not Present- Chest Pain, Difficulty Breathing Lying Down, Murmur, Palpitations, Racing/skipping heartbeats and Swelling. Gastrointestinal Not Present- Abdominal Pain, Bloody Stool, Constipation, Diarrhea, Difficulty Swallowing, Heartburn, Jaundice, Loss of appetitie, Nausea and Vomiting. Female Genitourinary Not Present- Blood in Urine, Discharge, Flank Pain, Incontinence, Painful Urination, Urgency, Urinary frequency, Urinary Retention, Urinating at Night and Weak urinary stream. Musculoskeletal Present- Joint Pain, Joint Swelling and Morning Stiffness. Not Present- Back Pain, Muscle Pain, Muscle Weakness and Spasms. Neurological Not Present- Blackout spells, Difficulty with balance, Dizziness, Paralysis, Tremor and Weakness. Psychiatric Not Present- Insomnia.  Vitals  Weight: 181 lb Height: 64in Weight was reported by patient. Height was reported by patient. Body Surface Area: 1.87 m Body Mass Index:  31.07 kg/m  BP: 156/94 (Sitting, Right Arm, Standard)  Physical Exam General Mental Status -Alert, cooperative and good historian. General Appearance-pleasant, Not in acute distress. Orientation-Oriented X3. Build & Nutrition-Well nourished and Well developed.  Head and Neck Head-normocephalic, atraumatic . Neck Global Assessment - supple, no bruit auscultated on the right, no bruit auscultated on the left.  Eye Vision-Wears corrective lenses. Pupil - Bilateral-Regular and Round. Motion - Bilateral-EOMI.  Chest and Lung Exam Auscultation Breath sounds - clear at anterior chest wall and clear at posterior chest wall. Adventitious sounds - No Adventitious sounds.  Cardiovascular Auscultation Rhythm - Regular rate and rhythm. Heart Sounds - S1 WNL and S2 WNL. Murmurs & Other Heart Sounds - Auscultation of the heart reveals - No Murmurs.  Abdomen Palpation/Percussion Tenderness - Abdomen is non-tender to palpation. Rigidity (guarding) - Abdomen is soft. Auscultation Auscultation of the abdomen reveals - Bowel sounds normal.  Female Genitourinary Note: Not done, not pertinent to present illness   Musculoskeletal Note: On exam, a well-developed female in no distress. Her right leg incision is well-healed with no erythema or exudate. There is minimal swelling. Her knee range of motion is about 0 to 115. She is not tender out laterally. There is no instability about the knee.  Assessment & Plan  Primary osteoarthritis of right knee (M17.11) Note:Surgical Plans: Right Total Knee Replacement  Disposition: Home  PCP: Dr. Charleston Poot - Patient has been seen preoperatively and felt to be stable for surgery.  IV TXA  Anesthesia Issues: None  Signed electronically by Joelene Millin, III PA-C

## 2014-08-10 ENCOUNTER — Encounter (HOSPITAL_COMMUNITY): Payer: Self-pay | Admitting: Orthopedic Surgery

## 2014-08-10 LAB — BASIC METABOLIC PANEL
ANION GAP: 8 (ref 5–15)
BUN: 12 mg/dL (ref 6–20)
CALCIUM: 8.8 mg/dL — AB (ref 8.9–10.3)
CO2: 26 mmol/L (ref 22–32)
Chloride: 105 mmol/L (ref 101–111)
Creatinine, Ser: 0.72 mg/dL (ref 0.44–1.00)
GFR calc Af Amer: >60 mL/min (ref >60)
GLUCOSE: 228 mg/dL — AB (ref 65–99)
Potassium: 4.3 mmol/L (ref 3.5–5.1)
Sodium: 139 mmol/L (ref 135–145)

## 2014-08-10 LAB — CBC
HCT: 34.1 % — ABNORMAL LOW (ref 36.0–46.0)
Hemoglobin: 11.6 g/dL — ABNORMAL LOW (ref 12.0–15.0)
MCH: 31.3 pg (ref 26.0–34.0)
MCHC: 34 g/dL (ref 30.0–36.0)
MCV: 91.9 fL (ref 78.0–100.0)
Platelets: 254 10*3/uL (ref 150–400)
RBC: 3.71 MIL/uL — ABNORMAL LOW (ref 3.87–5.11)
RDW: 13 % (ref 11.5–15.5)
WBC: 13.6 10*3/uL — ABNORMAL HIGH (ref 4.0–10.5)

## 2014-08-10 MED ORDER — RIVAROXABAN 10 MG PO TABS: 10.0000 mg | ORAL_TABLET | Freq: Every day | ORAL | Status: DC

## 2014-08-10 MED ORDER — EZETIMIBE 10 MG PO TABS
10.0000 mg | ORAL_TABLET | Freq: Every day | ORAL | Status: DC
  Filled 2014-08-10 (×2): qty 1

## 2014-08-10 MED ORDER — OXYCODONE HCL 5 MG PO TABS: 5.0000 mg | ORAL_TABLET | ORAL | Status: DC | PRN

## 2014-08-10 MED ORDER — TRAMADOL HCL 50 MG PO TABS: 50.0000 mg | ORAL_TABLET | Freq: Four times a day (QID) | ORAL | Status: DC | PRN

## 2014-08-10 MED ORDER — ATORVASTATIN CALCIUM 20 MG PO TABS
20.0000 mg | ORAL_TABLET | Freq: Every day | ORAL | Status: DC
  Filled 2014-08-10 (×2): qty 1

## 2014-08-10 MED ORDER — METHOCARBAMOL 500 MG PO TABS: 500.0000 mg | ORAL_TABLET | Freq: Four times a day (QID) | ORAL | Status: DC | PRN

## 2014-08-10 NOTE — Progress Notes (Signed)
   Subjective: 1 Day Post-Op Procedure(s) (LRB): RIGHT TOTAL KNEE ARTHROPLASTY (Right) Patient reports pain as mild.   Plan is to go Home after hospital stay.  Objective: Vital signs in last 24 hours: Temp:  [97.6 F (36.4 C)-99.3 F (37.4 C)] 98.7 F (37.1 C) (06/16 0613) Pulse Rate:  [86-100] 91 (06/16 0613) Resp:  [10-18] 16 (06/16 0613) BP: (114-146)/(49-88) 139/58 mmHg (06/16 0613) SpO2:  [93 %-100 %] 97 % (06/16 0613) Weight:  [83.915 kg (185 lb)] 83.915 kg (185 lb) (06/15 0840)  Intake/Output from previous day:  Intake/Output Summary (Last 24 hours) at 08/10/14 0652 Last data filed at 08/10/14 2542  Gross per 24 hour  Intake   3070 ml  Output   3190 ml  Net   -120 ml    Intake/Output this shift: Total I/O In: 1320 [P.O.:1320] Out: 2275 [Urine:2250; Drains:25]  Labs:  Recent Labs  08/10/14 0425  HGB 11.6*    Recent Labs  08/10/14 0425  WBC 13.6*  RBC 3.71*  HCT 34.1*  PLT 254    Recent Labs  08/10/14 0425  NA 139  K 4.3  CL 105  CO2 26  BUN 12  CREATININE 0.72  GLUCOSE 228*  CALCIUM 8.8*    Recent Labs  08/09/14 0850  INR 1.04    EXAM General - Patient is Alert, Appropriate and Oriented Extremity - Neurologically intact Neurovascular intact No cellulitis present Compartment soft Dressing - dressing C/D/I Motor Function - intact, moving foot and toes well on exam.  Hemovac pulled without difficulty.  Past Medical History  Diagnosis Date  . Allergic rhinitis, cause unspecified   . Arthritis   . Esophageal stenosis   . Hiatal hernia   . Unspecified essential hypertension     PT STOPPED BP MED WITH PCP KNOWLEDGE  . History of nonmelanoma skin cancer   . Cancer     hx skin cancer    Assessment/Plan: 1 Day Post-Op Procedure(s) (LRB): RIGHT TOTAL KNEE ARTHROPLASTY (Right) Principal Problem:   OA (osteoarthritis) of knee   Advance diet Up with therapy D/C IV fluids Plan for discharge tomorrow  DVT Prophylaxis -  Xarelto Weight-Bearing as tolerated to right leg   Elliette Seabolt V 08/10/2014, 6:52 AM

## 2014-08-10 NOTE — Evaluation (Signed)
Physical Therapy Evaluation Patient Details Name: [name] MRN: [id number] DOB: Aug 29, 1945 Today's Date: 08/10/2014   History of Present Illness  R TKR  Clinical Impression  Pt s/p R TKR presents with decreased R LE strength/ROM and post op pain limiting functional mobility.  Pt should progress to dc home with family assist and HHPT follow up.    Follow Up Recommendations Home health PT    Equipment Recommendations  None recommended by PT    Recommendations for Other Services OT consult     Precautions / Restrictions Precautions Precautions: Knee;Fall Required Braces or Orthoses: Knee Immobilizer - Right Knee Immobilizer - Right: Discontinue once straight leg raise with < 10 degree lag Restrictions Weight Bearing Restrictions: No Other Position/Activity Restrictions: WBAT      Mobility  Bed Mobility Overal bed mobility: Needs Assistance Bed Mobility: Supine to Sit     Supine to sit: Min assist     General bed mobility comments: cues for sequencing and use of L LE to self assist  Transfers Overall transfer level: Needs assistance Equipment used: Rolling walker (2 wheeled) Transfers: Sit to/from Stand Sit to Stand: Min assist         General transfer comment: cues for LE management and use of UEs to self assist  Ambulation/Gait Ambulation/Gait assistance: Min assist;Min guard Ambulation Distance (Feet): 75 Feet Assistive device: Rolling walker (2 wheeled) Gait Pattern/deviations: Step-to pattern;Step-through pattern;Decreased step length - right;Decreased step length - left;Shuffle;Trunk flexed Gait velocity: decr   General Gait Details: cues for sequence, posture, stride length and position from [employer]            Wheelchair Mobility    Modified Rankin (Stroke Patients Only)       Balance                                             Pertinent Vitals/Pain Pain Assessment: 0-10 Pain Score: 6  Pain Location: R  knee Pain Descriptors / Indicators: Aching;Sore Pain Intervention(s): Limited activity within patient's tolerance;Monitored during session;Premedicated before session;Ice applied    Home Living Family/patient expects to be discharged to:: Private residence Living Arrangements: Spouse/significant other Available Help at Discharge: Family;Available 24 hours/day Type of Home: House Home Access: Stairs to enter Entrance Stairs-Rails: Left Entrance Stairs-Number of Steps: 2-3 Home Layout: Two level;Able to live on main level with bedroom/bathroom Home Equipment: Gilford Rile - 2 wheels;Shower seat - built in;Grab bars - tub/shower;Crutches;Bedside commode Additional Comments: window sill by toilet    Prior Function Level of Independence: Independent               Hand Dominance   Dominant Hand: Right    Extremity/Trunk Assessment   Upper Extremity Assessment: Overall WFL for tasks assessed           Lower Extremity Assessment: RLE deficits/detail RLE Deficits / Details: 3-/5 quads with AAROM at knee -10 - 80    Cervical / Trunk Assessment: Normal  Communication   Communication: No difficulties  Cognition Arousal/Alertness: Awake/alert Behavior During Therapy: WFL for tasks assessed/performed Overall Cognitive Status: Within Functional Limits for tasks assessed                      General Comments      Exercises Total Joint Exercises Ankle Circles/Pumps: AROM;Both;15 reps;Supine Quad Sets: AROM;Both;10 reps;Supine Heel Slides: AAROM;15 reps;Supine;Right Straight  Leg Raises: AAROM;AROM;Right;15 reps;Supine      Assessment/Plan    PT Assessment Patient needs continued PT services  PT Diagnosis Difficulty walking   PT Problem List Decreased strength;Decreased range of motion;Decreased activity tolerance;Decreased mobility;Decreased knowledge of use of DME;Pain  PT Treatment Interventions DME instruction;Gait training;Stair training;Functional mobility  training;Therapeutic activities;Therapeutic exercise;Patient/family education   PT Goals (Current goals can be found in the Care Plan section) Acute Rehab PT Goals Patient Stated Goal: Resume previous lifestyle with decreased pain PT Goal Formulation: With patient Time For Goal Achievement: 08/15/14 Potential to Achieve Goals: Good    Frequency 7X/week   Barriers to discharge        Co-evaluation               End of Session Equipment Utilized During Treatment: Gait belt Activity Tolerance: Patient tolerated treatment well Patient left: in chair;with call bell/phone within reach;with family/visitor present Nurse Communication: Mobility status         Time: 1020-1055 PT Time Calculation (min) (ACUTE ONLY): 35 min   Charges:   PT Evaluation $Initial PT Evaluation Tier I: 1 Procedure PT Treatments $Therapeutic Exercise: 8-22 mins   PT G Codes:        [name] Aug 27, 2014, 12:56 PM

## 2014-08-10 NOTE — Progress Notes (Signed)
Physical Therapy Treatment Patient Details Name: JENIYAH MENOR MRN: [id number] DOB: 07-11-1945 Today's Date: 08/10/2014    History of Present Illness R TKR    PT Comments    Pt progressing well with mobility and hopeful for dc tomorrow.  Follow Up Recommendations  Home health PT     Equipment Recommendations  None recommended by PT    Recommendations for Other Services OT consult     Precautions / Restrictions Precautions Precautions: Knee;Fall Required Braces or Orthoses: Knee Immobilizer - Right Knee Immobilizer - Right: Discontinue once straight leg raise with < 10 degree lag (Pt performed IND SLR this date) Restrictions Weight Bearing Restrictions: No Other Position/Activity Restrictions: WBAT    Mobility  Bed Mobility Overal bed mobility: Needs Assistance Bed Mobility: Supine to Sit;Sit to Supine     Supine to sit: Min assist Sit to supine: Min assist   General bed mobility comments: cues for sequencing and use of L LE to self assist  Transfers Overall transfer level: Needs assistance Equipment used: Rolling walker (2 wheeled) Transfers: Sit to/from Stand Sit to Stand: Min guard         General transfer comment: cues for LE management and use of UEs to self assist  Ambulation/Gait Ambulation/Gait assistance: Min guard Ambulation Distance (Feet): 119 Feet Assistive device: Rolling walker (2 wheeled) Gait Pattern/deviations: Step-to pattern;Decreased step length - right;Decreased step length - left;Shuffle;Trunk flexed Gait velocity: decr   General Gait Details: cues for sequence, posture, stride length and position from [employer]            Wheelchair Mobility    Modified Rankin (Stroke Patients Only)       Balance                                    Cognition Arousal/Alertness: Awake/alert Behavior During Therapy: WFL for tasks assessed/performed Overall Cognitive Status: Within Functional Limits for tasks  assessed                      Exercises Total Joint Exercises Ankle Circles/Pumps: AROM;Both;15 reps;Supine Quad Sets: AROM;Both;10 reps;Supine Heel Slides: AAROM;15 reps;Supine;Right Straight Leg Raises: AAROM;AROM;Right;15 reps;Supine    General Comments        Pertinent Vitals/Pain Pain Assessment: 0-10 Pain Score: 5  Pain Location: R knee Pain Descriptors / Indicators: Aching;Sore Pain Intervention(s): Limited activity within patient's tolerance;Monitored during session;Premedicated before session;Ice applied    Home Living                      Prior Function            PT Goals (current goals can now be found in the care plan section) Acute Rehab PT Goals Patient Stated Goal: Resume previous lifestyle with decreased pain PT Goal Formulation: With patient Time For Goal Achievement: 08/15/14 Potential to Achieve Goals: Good Progress towards PT goals: Progressing toward goals    Frequency  7X/week    PT Plan Current plan remains appropriate    Co-evaluation             End of Session Equipment Utilized During Treatment: Gait belt Activity Tolerance: Patient tolerated treatment well Patient left: in bed;with call bell/phone within reach;with family/visitor present     Time: 1350-1416 PT Time Calculation (min) (ACUTE ONLY): 26 min  Charges:  $Gait Training: 8-22 mins $Therapeutic Exercise: 8-22 mins  G Codes:      Trichelle Lehan 08-22-2014, 4:57 PM

## 2014-08-10 NOTE — Discharge Instructions (Signed)
Dr. Gaynelle Arabian Total Joint Specialist [facility] [address] [place], [place] [address] [phone]  TOTAL KNEE REPLACEMENT POSTOPERATIVE DIRECTIONS    Knee Rehabilitation, Guidelines Following Surgery  Results after knee surgery are often greatly improved when you follow the exercise, range of motion and muscle strengthening exercises prescribed by your doctor. Safety measures are also important to protect the knee from further injury. Any time any of these exercises cause you to have increased pain or swelling in your knee joint, decrease the amount until you are comfortable again and slowly increase them. If you have problems or questions, call your caregiver or physical therapist for advice.   HOME CARE INSTRUCTIONS  Remove items at home which could result in a fall. This includes throw rugs or furniture in walking pathways.  Continue medications as instructed at time of discharge. You may have some home medications which will be placed on hold until you complete the course of blood thinner medication.  You may start showering once you are discharged home but do not submerge the incision under water. Just pat the incision dry and apply a dry gauze dressing on daily. Walk with walker as instructed.  You may resume a sexual relationship in one month or when given the OK by  your doctor.   Use walker as long as suggested by your caregivers.  Avoid periods of inactivity such as sitting longer than an hour when not asleep. This helps prevent blood clots.   You may return to work once you are cleared by your doctor.  Do not drive a car for 6 weeks or until released by you surgeon.   Do not drive while taking narcotics.  Wear the elastic stockings for three weeks following surgery during the day but you may remove then at night. Make sure you keep all of your appointments after your operation with all of your doctors and caregivers. You should call the  office at the above phone number and make an appointment for approximately two weeks after the date of your surgery. Change the dressing daily and reapply a dry dressing each time. Please pick up a stool softener and laxative for home use as long as you are requiring pain medications.  ICE to the affected knee every three hours for 30 minutes at a time and then as needed for pain and swelling.  Continue to use ice on the knee for pain and swelling from surgery. You may notice swelling that will progress down to the foot and ankle.  This is normal after surgery.  Elevate the leg when you are not up walking on it.   It is important for you to complete the blood thinner medication as prescribed by your doctor.  Continue to use the breathing machine which will help keep your temperature down.  It is common for your temperature to cycle up and down following surgery, especially at night when you are not up moving around and exerting yourself.  The breathing machine keeps your lungs expanded and your temperature down.  RANGE OF MOTION AND STRENGTHENING EXERCISES  Rehabilitation of the knee is important following a knee injury or an operation. After just a few days of immobilization, the muscles of the thigh which control the knee become weakened and shrink (atrophy). Knee exercises are designed to build up the tone and strength of the thigh muscles and to improve knee motion. Often times heat used for twenty to thirty minutes before working out will loosen  up your tissues and help with improving the range of motion but do not use heat for the first two weeks following surgery. These exercises can be done on a training (exercise) mat, on the floor, on a table or on a bed. Use what ever works the best and is most comfortable for you Knee exercises include:  Leg Lifts - While your knee is still immobilized in a splint or cast, you can do straight leg raises. Lift the leg to 60 degrees, hold for 3 sec, and slowly  lower the leg. Repeat 10-20 times 2-3 times daily. Perform this exercise against resistance later as your knee gets better.  Quad and Hamstring Sets - Tighten up the muscle on the front of the thigh (Quad) and hold for 5-10 sec. Repeat this 10-20 times hourly. Hamstring sets are done by pushing the foot backward against an object and holding for 5-10 sec. Repeat as with quad sets.  A rehabilitation program following serious knee injuries can speed recovery and prevent re-injury in the future due to weakened muscles. Contact your doctor or a physical therapist for more information on knee rehabilitation.   SKILLED REHAB INSTRUCTIONS: If the patient is transferred to a skilled rehab facility following release from the hospital, a list of the current medications will be sent to the facility for the patient to continue.  When discharged from the skilled rehab facility, please have the facility set up the patient's [place] prior to being released. Also, the skilled facility will be responsible for providing the patient with their medications at time of release from the facility to include their pain medication, the muscle relaxants, and their blood thinner medication. If the patient is still at the rehab facility at time of the two week follow up appointment, the skilled rehab facility will also need to assist the patient in arranging follow up appointment in our office and any transportation needs.  MAKE SURE YOU:  Understand these instructions.  Will watch your condition.  Will get help right away if you are not doing well or get worse.    Pick up stool softner and laxative for home use following surgery while on pain medications. Do not submerge incision under water. Please use good hand washing techniques while changing dressing each day. May shower starting three days after surgery. Please use a clean towel to pat the incision dry following showers. Continue to use ice for pain  and swelling after surgery. Do not use any lotions or creams on the incision until instructed by your surgeon.  Information on my medicine - XARELTO (Rivaroxaban)  This medication education was reviewed with me or my healthcare representative as part of my discharge preparation.  The pharmacist that spoke with me during my hospital stay was:  Minda Ditto, [facility]  Why was Xarelto prescribed for you? Xarelto was prescribed for you to reduce the risk of blood clots forming after orthopedic surgery. The medical term for these abnormal blood clots is venous thromboembolism (VTE).  What do you need to know about xarelto ? Take your Xarelto ONCE DAILY at the same time every day. You may take it either with or without food.  If you have difficulty swallowing the tablet whole, you may crush it and mix in applesauce just prior to taking your dose.  Take Xarelto exactly as prescribed by your doctor and DO NOT stop taking Xarelto without talking to the doctor who prescribed the medication.  Stopping without other VTE prevention  medication to take the place of Xarelto may increase your risk of developing a clot.  After discharge, you should have regular check-up appointments with your healthcare provider that is prescribing your Xarelto.    What do you do if you miss a dose? If you miss a dose, take it as soon as you remember on the same day then continue your regularly scheduled once daily regimen the next day. Do not take two doses of Xarelto on the same day.   Important Safety Information A possible side effect of Xarelto is bleeding. You should call your healthcare provider right away if you experience any of the following: ? Bleeding from an injury or your nose that does not stop. ? Unusual colored urine (red or dark brown) or unusual colored stools (red or black). ? Unusual bruising for unknown reasons. ? A serious fall or if you hit your head (even if there is no bleeding).  Some  medicines may interact with Xarelto and might increase your risk of bleeding while on Xarelto. To help avoid this, consult your healthcare provider or pharmacist prior to using any new prescription or non-prescription medications, including herbals, vitamins, non-steroidal anti-inflammatory drugs (NSAIDs) and supplements.  This website has more information on Xarelto: [email or web address]

## 2014-08-10 NOTE — Care Management Note (Signed)
Case Management Note  Patient Details  Name: ALIEAH BRINTON MRN: [id number] Date of Birth: 13-Jun-1945  Subjective/Objective:                   RIGHT TOTAL KNEE ARTHROPLASTY (Right) Action/Plan: Discharge planning  Expected Discharge Date:  08/11/14              Expected Discharge Plan:  [place]  In-House Referral:     Discharge planning Services  CM Consult  Post Acute Care Choice:  Home Health Choice offered to:  Patient  DME Arranged:    DME Agency:     HH Arranged:  PT HH Agency:  [facility]  Status of Service:  Completed, signed off  Medicare Important Message Given:    Date Medicare IM Given:    Medicare IM give by:    Date Additional Medicare IM Given:    Additional Medicare Important Message give by:     If discussed at [place] of Stay Meetings, dates discussed:    Additional Comments: CM met with pt in room to offer choice of home health agency.  Pt chooses [facility] to render HHPT.  Pt has all the DME she needs from last surgery.  Address and contact information verified by pt.  Referral given to [employer], Tim.  No other CM needs were communicated. Dellie Catholic, RN 08/10/2014, 10:23 AM

## 2014-08-11 LAB — BASIC METABOLIC PANEL
Anion gap: 9 (ref 5–15)
BUN: 15 mg/dL (ref 6–20)
CALCIUM: 9.1 mg/dL (ref 8.9–10.3)
CO2: 28 mmol/L (ref 22–32)
CREATININE: 0.75 mg/dL (ref 0.44–1.00)
Chloride: 102 mmol/L (ref 101–111)
GFR calc Af Amer: >60 mL/min (ref >60)
Glucose, Bld: 208 mg/dL — ABNORMAL HIGH (ref 65–99)
Potassium: 4.4 mmol/L (ref 3.5–5.1)
Sodium: 139 mmol/L (ref 135–145)

## 2014-08-11 LAB — CBC
HEMATOCRIT: 31.9 % — AB (ref 36.0–46.0)
Hemoglobin: 10.6 g/dL — ABNORMAL LOW (ref 12.0–15.0)
MCH: 30.2 pg (ref 26.0–34.0)
MCHC: 33.2 g/dL (ref 30.0–36.0)
MCV: 90.9 fL (ref 78.0–100.0)
Platelets: 228 10*3/uL (ref 150–400)
RBC: 3.51 MIL/uL — AB (ref 3.87–5.11)
RDW: 13.1 % (ref 11.5–15.5)
WBC: 13.7 10*3/uL — AB (ref 4.0–10.5)

## 2014-08-11 NOTE — Progress Notes (Signed)
OT Cancellation Note  Patient Details Name: TARIYA MORRISSETTE MRN: [id number] DOB: 02/03/1946   Cancelled Treatment:    Reason Eval/Treat Not Completed: OT screened, no needs identified, will sign off  [place], Thereasa Parkin 08/11/2014, 9:53 AM

## 2014-08-11 NOTE — Progress Notes (Signed)
Physical Therapy Treatment Patient Details Name: [name] MRN: [id number] DOB: 09-Mar-1945 Today's Date: 08/11/2014    History of Present Illness R TKR    PT Comments    Pt progressing well and eager for dc  Follow Up Recommendations  Home health PT     Equipment Recommendations  None recommended by PT    Recommendations for Other Services OT consult     Precautions / Restrictions Precautions Precautions: Knee;Fall Required Braces or Orthoses: Knee Immobilizer - Right Knee Immobilizer - Right: Discontinue once straight leg raise with < 10 degree lag Restrictions Weight Bearing Restrictions: No Other Position/Activity Restrictions: WBAT    Mobility  Bed Mobility Overal bed mobility: Modified Independent                Transfers Overall transfer level: Needs assistance Equipment used: Rolling walker (2 wheeled) Transfers: Sit to/from Stand Sit to Stand: Supervision         General transfer comment: cues for LE management and use of UEs to self assist  Ambulation/Gait Ambulation/Gait assistance: Min guard;Supervision Ambulation Distance (Feet): 150 Feet Assistive device: Rolling walker (2 wheeled) Gait Pattern/deviations: Step-to pattern;Step-through pattern;Shuffle Gait velocity: decr   General Gait Details: min cues for sequence, posture, stride length and position from RW   Stairs Stairs: Yes Stairs assistance: Min assist Stair Management: One rail Right;Step to pattern;Forwards;With crutches Number of Stairs: 7 General stair comments: cues for sequence and foot/crutch placement  Wheelchair Mobility    Modified Rankin (Stroke Patients Only)       Balance                                    Cognition Arousal/Alertness: Awake/alert Behavior During Therapy: WFL for tasks assessed/performed Overall Cognitive Status: Within Functional Limits for tasks assessed                      Exercises Total Joint  Exercises Ankle Circles/Pumps: AROM;Both;15 reps;Supine Quad Sets: AROM;Both;Supine;20 reps Heel Slides: AAROM;Supine;Right;20 reps Straight Leg Raises: AAROM;AROM;Right;Supine;20 reps Long Arc Quad: AAROM;AROM;Right;20 reps;Seated    General Comments        Pertinent Vitals/Pain Pain Assessment: 0-10 Pain Score: 4  Pain Location: R knee Pain Descriptors / Indicators: Aching;Sore Pain Intervention(s): Limited activity within patient's tolerance;Monitored during session;Premedicated before session;Ice applied    Home Living                      Prior Function            PT Goals (current goals can now be found in the care plan section) Acute Rehab PT Goals Patient Stated Goal: Resume previous lifestyle with decreased pain PT Goal Formulation: With patient Time For Goal Achievement: 08/15/14 Potential to Achieve Goals: Good Progress towards PT goals: Progressing toward goals    Frequency  7X/week    PT Plan Current plan remains appropriate    Co-evaluation             End of Session Equipment Utilized During Treatment: Gait belt Activity Tolerance: Patient tolerated treatment well Patient left: in chair;with call bell/phone within reach     Time: 0805-0840 PT Time Calculation (min) (ACUTE ONLY): 35 min  Charges:  $Gait Training: 8-22 mins $Therapeutic Exercise: 8-22 mins                    G Codes:  [name] 08/11/2014, 12:26 PM

## 2014-08-11 NOTE — Progress Notes (Deleted)
OT  Note  Patient Details Name: AERYN MEDICI MRN: [id number] DOB: 1945-12-20   Cancelled Treatment:   Noted plan for SNF- Will defer OT eval to SNF [place], Edwena Felty D 08/11/2014, 9:30 AM

## 2014-08-11 NOTE — Progress Notes (Signed)
   Subjective: 2 Days Post-Op Procedure(s) (LRB): RIGHT TOTAL KNEE ARTHROPLASTY (Right) Patient reports pain as mild.   Plan is to go Home after hospital stay.  Objective: Vital signs in last 24 hours: Temp:  [98.2 F (36.8 C)-98.8 F (37.1 C)] 98.2 F (36.8 C) (06/17 0441) Pulse Rate:  [92-94] 94 (06/17 0441) Resp:  [16] 16 (06/17 0441) BP: (148-156)/(54-64) 148/54 mmHg (06/17 0441) SpO2:  [91 %-93 %] 92 % (06/17 0441)  Intake/Output from previous day:  Intake/Output Summary (Last 24 hours) at 08/11/14 0828 Last data filed at 08/11/14 0442  Gross per 24 hour  Intake    720 ml  Output   2375 ml  Net  -1655 ml    Intake/Output this shift:    Labs:  Recent Labs  08/10/14 0425 08/11/14 0430  HGB 11.6* 10.6*    Recent Labs  08/10/14 0425 08/11/14 0430  WBC 13.6* 13.7*  RBC 3.71* 3.51*  HCT 34.1* 31.9*  PLT 254 228    Recent Labs  08/10/14 0425 08/11/14 0430  NA 139 139  K 4.3 4.4  CL 105 102  CO2 26 28  BUN 12 15  CREATININE 0.72 0.75  GLUCOSE 228* 208*  CALCIUM 8.8* 9.1    Recent Labs  08/09/14 0850  INR 1.04    EXAM General - Patient is Alert, Appropriate and Oriented Extremity - Neurologically intact Neurovascular intact No cellulitis present Compartment soft Dressing/Incision - clean, dry, no drainage Motor Function - intact, moving foot and toes well on exam.   Past Medical History  Diagnosis Date  . Allergic rhinitis, cause unspecified   . Arthritis   . Esophageal stenosis   . Hiatal hernia   . Unspecified essential hypertension     PT STOPPED BP MED WITH PCP KNOWLEDGE  . History of nonmelanoma skin cancer   . Cancer     hx skin cancer    Assessment/Plan: 2 Days Post-Op Procedure(s) (LRB): RIGHT TOTAL KNEE ARTHROPLASTY (Right) Principal Problem:   OA (osteoarthritis) of knee   Discharge home with home health  DVT Prophylaxis - Xarelto Weight-Bearing as tolerated to right leg  Jerico Grisso V 08/11/2014, 8:28 AM

## 2014-08-15 NOTE — Discharge Summary (Signed)
Physician Discharge Summary   Patient ID: [name] MRN: [id number] DOB/AGE: 68/26/1947 68 y.o.  Admit date: 08/09/2014 Discharge date: 08/11/2014  Primary Diagnosis: Primary osteoarthritis, right knee  Admission Diagnoses:  Past Medical History  Diagnosis Date  . Allergic rhinitis, cause unspecified   . Arthritis   . Esophageal stenosis   . Hiatal hernia   . Unspecified essential hypertension     PT STOPPED BP MED WITH PCP KNOWLEDGE  . History of nonmelanoma skin cancer   . Cancer     hx skin cancer   Discharge Diagnoses:   Principal Problem:   OA (osteoarthritis) of knee  Estimated body mass index is 31.74 kg/(m^2) as calculated from the following:   Height as of this encounter: 5' 4"  (1.626 m).   Weight as of this encounter: 83.915 kg (185 lb).  Procedure:  Procedure(s) (LRB): RIGHT TOTAL KNEE ARTHROPLASTY (Right)   Consults: None  HPI: The patient reports right knee symptoms including: pain and swelling which began year(s) ago without any known injury (She had a fall originally that caused a right mensical tear, causing her to have her first right knee scope, she believes it was around 2007-09. She had had a total of 3 right knee scopes, most recent one was in January, 2015 by Dr. Durward Fortes with [facility]. She had had buckling episodes and swelling on the knee. She has had Synvisc series and cortisone injections). The patient describes the severity of the symptoms as moderate in severity. The patient describes their pain as sharp, dull and aching.The patient feels that the symptoms are worsening. The patient is not currently being treated for this problem. She does not really want to conitnue with injections as she does not feel that they are very effective for her. She would like to have the knee replaced during the winter so that she is doing better once the weather warms up again. Unfortunately, her knee has gotten progressively worse over time. It's at a  stage where it is hurting her at all times now. It's limiting what she can and cannot do. She occasionally gets swelling. She is having some associated hip pain at times. She's not having lower extremity weakness or paresthesias. The knee is keeping her awake at night. It's definitely limiting what she can and cannot do and having a negative effect on her lifestyle. The left knee is not bothering her as much. She would like to proceed with knee sur gery at this time. They have been treated conservatively in the past for the above stated problem and despite conservative measures, they continue to have progressive pain and severe functional limitations and dysfunction. They have failed non-operative management including home exercise, medications, and injections. It is felt that they would benefit from undergoing total joint replacement. Risks and benefits of the procedure have been discussed with the patient and they elect to proceed with surgery. There are no active contraindications to surgery such as ongoing infection or rapidly progressive neurological disease.  Laboratory Data: Admission on 08/09/2014, Discharged on 08/11/2014  Component Date Value Ref Range Status  . ABO/RH(D) 07/13/2014 O POS   Final  . aPTT 08/09/2014 63* 24 - 37 seconds Final   Comment:        IF BASELINE aPTT IS ELEVATED, SUGGEST PATIENT RISK ASSESSMENT BE USED TO DETERMINE APPROPRIATE ANTICOAGULANT THERAPY.   . Prothrombin Time 08/09/2014 13.8  11.6 - 15.2 seconds Final  . INR 08/09/2014 1.04  0.00 - 1.49 Final  . ABO/RH(D)  08/09/2014 O POS   Final  . Antibody Screen 08/09/2014 NEG   Final  . Sample Expiration 08/09/2014 08/12/2014   Final  . WBC 08/10/2014 13.6* 4.0 - 10.5 K/uL Final  . RBC 08/10/2014 3.71* 3.87 - 5.11 MIL/uL Final  . Hemoglobin 08/10/2014 11.6* 12.0 - 15.0 g/dL Final  . HCT 08/10/2014 34.1* 36.0 - 46.0 % Final  . MCV 08/10/2014 91.9  78.0 - 100.0 fL Final  . MCH 08/10/2014 31.3  26.0 - 34.0  pg Final  . MCHC 08/10/2014 34.0  30.0 - 36.0 g/dL Final  . RDW 08/10/2014 13.0  11.5 - 15.5 % Final  . Platelets 08/10/2014 254  150 - 400 K/uL Final  . Sodium 08/10/2014 139  135 - 145 mmol/L Final  . Potassium 08/10/2014 4.3  3.5 - 5.1 mmol/L Final  . Chloride 08/10/2014 105  101 - 111 mmol/L Final  . CO2 08/10/2014 26  22 - 32 mmol/L Final  . Glucose, Bld 08/10/2014 228* 65 - 99 mg/dL Final  . BUN 08/10/2014 12  6 - 20 mg/dL Final  . Creatinine, Ser 08/10/2014 0.72  0.44 - 1.00 mg/dL Final  . Calcium 08/10/2014 8.8* 8.9 - 10.3 mg/dL Final  . GFR calc non Af Amer 08/10/2014 >60  >60 mL/min Final  . GFR calc Af Amer 08/10/2014 >60  >60 mL/min Final   Comment: (NOTE) The eGFR has been calculated using the CKD EPI equation. This calculation has not been validated in all clinical situations. eGFR's persistently <60 mL/min signify possible Chronic Kidney Disease.   . Anion gap 08/10/2014 8  5 - 15 Final  . WBC 08/11/2014 13.7* 4.0 - 10.5 K/uL Final  . RBC 08/11/2014 3.51* 3.87 - 5.11 MIL/uL Final  . Hemoglobin 08/11/2014 10.6* 12.0 - 15.0 g/dL Final  . HCT 08/11/2014 31.9* 36.0 - 46.0 % Final  . MCV 08/11/2014 90.9  78.0 - 100.0 fL Final  . MCH 08/11/2014 30.2  26.0 - 34.0 pg Final  . MCHC 08/11/2014 33.2  30.0 - 36.0 g/dL Final  . RDW 08/11/2014 13.1  11.5 - 15.5 % Final  . Platelets 08/11/2014 228  150 - 400 K/uL Final  . Sodium 08/11/2014 139  135 - 145 mmol/L Final  . Potassium 08/11/2014 4.4  3.5 - 5.1 mmol/L Final  . Chloride 08/11/2014 102  101 - 111 mmol/L Final  . CO2 08/11/2014 28  22 - 32 mmol/L Final  . Glucose, Bld 08/11/2014 208* 65 - 99 mg/dL Final  . BUN 08/11/2014 15  6 - 20 mg/dL Final  . Creatinine, Ser 08/11/2014 0.75  0.44 - 1.00 mg/dL Final  . Calcium 08/11/2014 9.1  8.9 - 10.3 mg/dL Final  . GFR calc non Af Amer 08/11/2014 >60  >60 mL/min Final  . GFR calc Af Amer 08/11/2014 >60  >60 mL/min Final   Comment: (NOTE) The eGFR has been calculated using the  CKD EPI equation. This calculation has not been validated in all clinical situations. eGFR's persistently <60 mL/min signify possible Chronic Kidney Disease.   Georgiann Hahn gap 08/11/2014 9  5 - 15 Final  Hospital Outpatient Visit on 07/12/2014  Component Date Value Ref Range Status  . MRSA, PCR 07/12/2014 NEGATIVE  NEGATIVE Final  . Staphylococcus aureus 07/12/2014 NEGATIVE  NEGATIVE Final   Comment:        The Xpert SA Assay (FDA approved for NASAL specimens in patients over 65 years of age), is one component of a comprehensive surveillance program.  Test  performance has been validated by [facility] for patients greater than or equal to 35 year old. It is not intended to diagnose infection nor to guide or monitor treatment.   Marland Kitchen aPTT 07/12/2014 75* 24 - 37 seconds Final   Comment:        IF BASELINE aPTT IS ELEVATED, SUGGEST PATIENT RISK ASSESSMENT BE USED TO DETERMINE APPROPRIATE ANTICOAGULANT THERAPY.   . Prothrombin Time 07/12/2014 14.7  11.6 - 15.2 seconds Final  . INR 07/12/2014 1.13  0.00 - 1.49 Final  . Color, Urine 07/12/2014 Christohper Dube* YELLOW Final   BIOCHEMICALS MAY BE AFFECTED BY COLOR  . APPearance 07/12/2014 TURBID* CLEAR Final  . Specific Gravity, Urine 07/12/2014 1.029  1.005 - 1.030 Final  . pH 07/12/2014 6.0  5.0 - 8.0 Final  . Glucose, UA 07/12/2014 NEGATIVE  NEGATIVE mg/dL Final  . Hgb urine dipstick 07/12/2014 MODERATE* NEGATIVE Final  . Bilirubin Urine 07/12/2014 SMALL* NEGATIVE Final  . Ketones, ur 07/12/2014 NEGATIVE  NEGATIVE mg/dL Final  . Protein, ur 07/12/2014 30* NEGATIVE mg/dL Final  . Urobilinogen, UA 07/12/2014 0.2  0.0 - 1.0 mg/dL Final  . Nitrite 07/12/2014 NEGATIVE  NEGATIVE Final  . Leukocytes, UA 07/12/2014 MODERATE* NEGATIVE Final  . ABO/RH(D) 07/12/2014 O POS   Final  . Antibody Screen 07/12/2014 NEG   Final  . Sample Expiration 07/12/2014 07/20/2014   Final  . Squamous Epithelial / LPF 07/12/2014 FEW* RARE Final  . WBC, UA 07/12/2014  21-50  <3 WBC/hpf Final  . RBC / HPF 07/12/2014 3-6  <3 RBC/hpf Final  . Bacteria, UA 07/12/2014 FEW* RARE Final  . Casts 07/12/2014 HYALINE CASTS* NEGATIVE Final  . Urine-Other 07/12/2014 MUCOUS PRESENT   Final       Hospital Course: [name] is a 69 y.o. who was admitted to [facility]. They were brought to the operating room on 08/09/2014 and underwent Procedure(s): RIGHT TOTAL KNEE ARTHROPLASTY.  Patient tolerated the procedure well and was later transferred to the recovery room and then to the orthopaedic floor for postoperative care.  They were given PO and IV analgesics for pain control following their surgery.  They were given 24 hours of postoperative antibiotics of  Anti-infectives    Start     Dose/Rate Route Frequency Ordered Stop   08/09/14 1700  ceFAZolin (ANCEF) IVPB 2 g/50 mL premix     2 g 100 mL/hr over 30 Minutes Intravenous Every 6 hours 08/09/14 1451 08/10/14 0053   08/09/14 0810  ceFAZolin (ANCEF) IVPB 2 g/50 mL premix     2 g 100 mL/hr over 30 Minutes Intravenous On call to O.R. 08/09/14 0810 08/09/14 1036     and started on DVT prophylaxis in the form of Xarelto.   PT and OT were ordered for total joint protocol.  Discharge planning consulted to help with postop disposition and equipment needs.  Patient had a good night on the evening of surgery.  They started to get up OOB with therapy on day one. Hemovac drain was pulled without difficulty.  Continued to work with therapy into day two.  Dressing was changed on day two and the incision was clean and dry.   Incision was healing well.  Patient was seen in rounds and was ready to go home.   Diet: Cardiac diet Activity:WBAT Follow-up:in 2 weeks Disposition - Home Discharged Condition: stable   Discharge Instructions    Call MD / Call 911    Complete by:  As directed  If you experience chest pain or shortness of breath, CALL 911 and be transported to the hospital emergency room.  If you develope a  fever above 101 F, pus (white drainage) or increased drainage or redness at the wound, or calf pain, call your surgeon's office.     Constipation Prevention    Complete by:  As directed   Drink plenty of fluids.  Prune juice may be helpful.  You may use a stool softener, such as Colace (over the counter) 100 mg twice a day.  Use MiraLax (over the counter) for constipation as needed.     Diet - low sodium heart healthy    Complete by:  As directed      Increase activity slowly as tolerated    Complete by:  As directed             Medication List    STOP taking these medications        acetaminophen 325 MG tablet  Commonly known as:  TYLENOL     acetaminophen 500 MG tablet  Commonly known as:  TYLENOL     aspirin 325 MG tablet     cholecalciferol 400 UNITS Tabs tablet  Commonly known as:  VITAMIN D     ibuprofen 200 MG tablet  Commonly known as:  ADVIL,MOTRIN     multivitamin with minerals Tabs tablet      TAKE these medications        amLODipine 10 MG tablet  Commonly known as:  NORVASC  Take 10 mg by mouth every morning.     calcium citrate-vitamin D 315-200 MG-UNIT per tablet  Commonly known as:  CITRACAL+D  Take 1 tablet by mouth every morning.     esomeprazole 40 MG packet  Commonly known as:  NEXIUM  Take 40 mg by mouth daily before breakfast.     loratadine 10 MG tablet  Commonly known as:  CLARITIN  Take 10 mg by mouth daily as needed for allergies.     methocarbamol 500 MG tablet  Commonly known as:  ROBAXIN  Take 1 tablet (500 mg total) by mouth every 6 (six) hours as needed for muscle spasms.     oxyCODONE 5 MG immediate release tablet  Commonly known as:  Oxy IR/ROXICODONE  Take 1-2 tablets (5-10 mg total) by mouth every 3 (three) hours as needed for breakthrough pain.     rivaroxaban 10 MG Tabs tablet  Commonly known as:  XARELTO  Take 1 tablet (10 mg total) by mouth daily with breakfast.     traMADol 50 MG tablet  Commonly known as:  ULTRAM   Take 1-2 tablets (50-100 mg total) by mouth every 6 (six) hours as needed for moderate pain.     VISINE EXTRA OP  Apply 1 drop to eye daily as needed (tired/dry eyes.).     VYTORIN 10-40 MG per tablet  Generic drug:  ezetimibe-simvastatin  Take 1 tablet by mouth at bedtime.           Follow-up Information    Follow up with [name], MD. Schedule an appointment as soon as possible for a visit on 08/22/2014.   Specialty:  Orthopedic Surgery   Why:  Call [phone] tomorrow to make the appointment   Contact information:   [address] [place] [address] [phone]       Follow up with [facility].   Why:  home health physical therapy   Contact information:   [address] [place] [address] [phone]       Signed: Ardeen Jourdain, PA-C Orthopaedic Surgery 08/15/2014, 8:37 AM

## 2014-09-13 ENCOUNTER — Encounter: Payer: Self-pay | Admitting: Gastroenterology

## 2015-02-08 ENCOUNTER — Other Ambulatory Visit: Payer: Self-pay | Admitting: Orthopedic Surgery

## 2015-02-08 DIAGNOSIS — M48061 Spinal stenosis, lumbar region without neurogenic claudication: Secondary | ICD-10-CM

## 2015-02-09 ENCOUNTER — Ambulatory Visit
Admission: RE | Admit: 2015-02-09 | Discharge: 2015-02-09 | Disposition: A | Discharge status: Home / Self Care | Payer: Medicare Other | Source: Ambulatory Visit | Attending: Orthopedic Surgery | Admitting: Orthopedic Surgery

## 2015-02-09 ENCOUNTER — Inpatient Hospital Stay
Admission: RE | Admit: 2015-02-09 | Discharge: 2015-02-09 | Disposition: A | Discharge status: Home / Self Care | Payer: Self-pay | Source: Ambulatory Visit | Attending: Orthopedic Surgery | Admitting: Orthopedic Surgery

## 2015-02-09 ENCOUNTER — Other Ambulatory Visit: Payer: Self-pay | Admitting: Orthopedic Surgery

## 2015-02-09 DIAGNOSIS — M48061 Spinal stenosis, lumbar region without neurogenic claudication: Secondary | ICD-10-CM

## 2015-02-09 MED ORDER — IOHEXOL 180 MG/ML  SOLN
15.0000 mL | Freq: Once | INTRAMUSCULAR | Status: AC | PRN
  Administered 2015-02-09: 15 mL via INTRATHECAL

## 2015-02-09 MED ORDER — DIAZEPAM 5 MG PO TABS
5.0000 mg | ORAL_TABLET | Freq: Once | ORAL | Status: AC
  Administered 2015-02-09: 5 mg via ORAL

## 2015-02-09 NOTE — Discharge Instructions (Signed)

## 2015-02-16 ENCOUNTER — Other Ambulatory Visit: Payer: Medicare Other

## 2015-02-21 ENCOUNTER — Encounter (HOSPITAL_COMMUNITY)
Admission: RE | Admit: 2015-02-21 | Discharge: 2015-02-21 | Disposition: A | Discharge status: Home / Self Care | Payer: Medicare Other | Source: Ambulatory Visit | Attending: Orthopedic Surgery | Admitting: Orthopedic Surgery

## 2015-02-21 ENCOUNTER — Encounter (HOSPITAL_COMMUNITY): Payer: Self-pay

## 2015-02-21 DIAGNOSIS — Z01818 Encounter for other preprocedural examination: Secondary | ICD-10-CM | POA: Insufficient documentation

## 2015-02-21 DIAGNOSIS — M4806 Spinal stenosis, lumbar region: Secondary | ICD-10-CM | POA: Diagnosis not present

## 2015-02-21 HISTORY — DX: Repeated falls: R29.6

## 2015-02-21 HISTORY — DX: Personal history of other diseases of the respiratory system: Z87.09

## 2015-02-21 HISTORY — DX: Presence of spectacles and contact lenses: Z97.3

## 2015-02-21 HISTORY — DX: Pneumonia, unspecified organism: J18.9

## 2015-02-21 HISTORY — DX: Gastro-esophageal reflux disease without esophagitis: K21.9

## 2015-02-21 HISTORY — DX: Unspecified fracture of right lower leg, initial encounter for closed fracture: S82.91XA

## 2015-02-21 HISTORY — DX: Personal history of urinary (tract) infections: Z87.440

## 2015-02-21 LAB — BASIC METABOLIC PANEL
Anion gap: 9 (ref 5–15)
BUN: 15 mg/dL (ref 6–20)
CHLORIDE: 105 mmol/L (ref 101–111)
CO2: 29 mmol/L (ref 22–32)
CREATININE: 0.84 mg/dL (ref 0.44–1.00)
Calcium: 9.8 mg/dL (ref 8.9–10.3)
GFR calc non Af Amer: >60 mL/min (ref >60)
GLUCOSE: 106 mg/dL — AB (ref 65–99)
Potassium: 4.5 mmol/L (ref 3.5–5.1)
Sodium: 143 mmol/L (ref 135–145)

## 2015-02-21 LAB — CBC
HCT: 42.9 % (ref 36.0–46.0)
Hemoglobin: 14.6 g/dL (ref 12.0–15.0)
MCH: 31.4 pg (ref 26.0–34.0)
MCHC: 34 g/dL (ref 30.0–36.0)
MCV: 92.3 fL (ref 78.0–100.0)
PLATELETS: 272 10*3/uL (ref 150–400)
RBC: 4.65 MIL/uL (ref 3.87–5.11)
RDW: 12.3 % (ref 11.5–15.5)
WBC: 6.3 10*3/uL (ref 4.0–10.5)

## 2015-02-21 LAB — SURGICAL PCR SCREEN
MRSA, PCR: NEGATIVE
Staphylococcus aureus: NEGATIVE

## 2015-02-21 NOTE — Patient Instructions (Signed)
[name]  02/21/2015   Your procedure is scheduled on: Wednesday March 07, 2015   Report to [facility]  Entrance take [place]  elevators to 3rd floor to  [place] at 9:00 AM.  Call this number if you have problems the morning of surgery [phone]   Remember: ONLY 1 PERSON MAY GO WITH YOU TO SHORT STAY TO GET  READY MORNING OF [place].  Do not eat food or drink liquids :After Midnight.     Take these medicines the morning of surgery with A SIP OF WATER: Amlodipine (Norvasc); Duloxetine (Cymbalta); Esomeprazole (Nexium); May use flonase if needed; May use eye drops if needed                                You may not have any metal on your body including hair pins and              piercings  Do not wear jewelry, make-up, lotions, powders or perfumes, deodorant             Do not wear nail polish.  Do not shave  48 hours prior to surgery.              Do not bring valuables to the hospital. [place].  Contacts, dentures or bridgework may not be worn into surgery.  Leave suitcase in the car. After surgery it may be brought to your room.                Please read over the following fact sheets you were given:MRSA INFORMATION SHEET  _____________________________________________________________________             [facility] - Preparing for Surgery Before surgery, you can play an important role.  Because skin is not sterile, your skin needs to be as free of germs as possible.  You can reduce the number of germs on your skin by washing with CHG (chlorahexidine gluconate) soap before surgery.  CHG is an antiseptic cleaner which kills germs and bonds with the skin to continue killing germs even after washing. Please DO NOT use if you have an allergy to CHG or antibacterial soaps.  If your skin becomes reddened/irritated stop using the CHG and inform your nurse when you arrive at Short  Stay. Do not shave (including legs and underarms) for at least 48 hours prior to the first CHG shower.  You may shave your face/neck. Please follow these instructions carefully:  1.  Shower with CHG Soap the night before surgery and the  morning of Surgery.  2.  If you choose to wash your hair, wash your hair first as usual with your  normal  shampoo.  3.  After you shampoo, rinse your hair and body thoroughly to remove the  shampoo.                           4.  Use CHG as you would any other liquid soap.  You can apply chg directly  to the skin and wash                       Gently  with a scrungie or clean washcloth.  5.  Apply the CHG Soap to your body ONLY FROM THE NECK DOWN.   Do not use on face/ open                           Wound or open sores. Avoid contact with eyes, ears mouth and genitals (private parts).                       Wash face,  Genitals (private parts) with your normal soap.             6.  Wash thoroughly, paying special attention to the area where your surgery  will be performed.  7.  Thoroughly rinse your body with warm water from the neck down.  8.  DO NOT shower/wash with your normal soap after using and rinsing off  the CHG Soap.                9.  Pat yourself dry with a clean towel.            10.  Wear clean pajamas.            11.  Place clean sheets on your bed the night of your first shower and do not  sleep with pets. Day of Surgery : Do not apply any lotions/deodorants the morning of surgery.  Please wear clean clothes to the hospital/surgery center.  FAILURE TO FOLLOW THESE INSTRUCTIONS MAY RESULT IN THE CANCELLATION OF YOUR SURGERY PATIENT SIGNATURE_________________________________  NURSE SIGNATURE__________________________________  ________________________________________________________________________

## 2015-02-21 NOTE — Progress Notes (Signed)
CXR/care everywhere 10/02/2014 EKG per chart 07/11/2014

## 2015-02-22 ENCOUNTER — Other Ambulatory Visit: Payer: Self-pay | Admitting: Surgical

## 2015-03-04 NOTE — H&P (Signed)
[name] is an 70 y.o. female.   Chief Complaint: low back pain HPI: the patient presented with the chief complaint of low back pain. She had a couple episodes of bowel incontinence. Eight weeks ago, she was on a ladder, fell several feet and landed on her buttocks. She has no sciatic-type complaints. She had no numbness, no tingling down her legs. CT showed severe stenosis at L4-L5.  Past Medical History  Diagnosis Date  . Allergic rhinitis, cause unspecified   . Arthritis   . Esophageal stenosis   . Hiatal hernia   . Unspecified essential hypertension     PT STOPPED BP MED WITH PCP KNOWLEDGE  . History of nonmelanoma skin cancer   . Cancer ([name])     hx skin cancer  . Pneumonia     2015  . Leg fracture, right     2015  . History of bronchitis as a child   . History of urinary tract infection   . GERD (gastroesophageal reflux disease)   . Wears glasses   . Multiple falls     Past Surgical History  Procedure Laterality Date  . Appendectomy  1984  . Tubal ligation      76 reversal in 82 and tubal in 86  . Laparoscopic cholecystectomy  04/2004  . Laporoscopy  1977    ovarian cyst  . Lateral collateral ligament repair, knee      RT KNEE  . Hand surgery      trigger finger release bilat   . Total ankle replacement      7 years ago; left   . Orif tibia plateau Right 12/26/2013    Procedure: OPEN REDUCTION INTERNAL FIXATION (ORIF) TIBIAL PLATEAU;  Surgeon: Cassandra Alf, MD;  Location: [facility] ORS;  Service: Orthopedics;  Laterality: Right;  . Bladder suspension  SEVERAL YRS AGO  . Hardware removal Right 04/19/2014    Procedure: HARDWARE REMOVAL FROM RIGHT TIBIA;  Surgeon: Cassandra Alf, MD;  Location: [facility] ORS;  Service: Orthopedics;  Laterality: Right;  . Total knee arthroplasty Right 08/09/2014    Procedure: RIGHT TOTAL KNEE ARTHROPLASTY;  Surgeon: Cassandra Arabian, MD;  Location: [facility] ORS;  Service: Orthopedics;  Laterality: Right;    Family History  Problem Relation Age  of Onset  . Stomach cancer Brother 27    question if started pancreatic   . Diabetes Sister   . Heart disease Brother   . Colon cancer Neg Hx    Social History:  reports that she has never smoked. She has never used smokeless tobacco. She reports that she drinks about 1.8 oz of alcohol per week. She reports that she does not use illicit drugs.  Allergies: No Known Allergies    Current outpatient prescriptions:  .  albuterol (PROAIR HFA) 108 (90 BASE) MCG/ACT inhaler, Inhale 2 puffs into the lungs every 6 (six) hours as needed. Shortness of breath, Disp: , Rfl:  .  amLODipine (NORVASC) 10 MG tablet, Take 10 mg by mouth every morning., Disp: , Rfl:  .  calcium citrate-vitamin D (CITRACAL+D) 315-200 MG-UNIT per tablet, Take 1 tablet by mouth every morning., Disp: , Rfl:  .  DULoxetine (CYMBALTA) 30 MG capsule, Take 60 mg by mouth daily., Disp: , Rfl:  .  esomeprazole (NEXIUM) 40 MG packet, Take 40 mg by mouth daily before breakfast., Disp: , Rfl:  .  fluticasone (FLONASE) 50 MCG/ACT nasal spray, Place 1 spray into the nose daily as needed. allergies, Disp: , Rfl:  .  montelukast (SINGULAIR) 10 MG tablet, Take 10 mg by mouth at bedtime., Disp: , Rfl:  .  Multiple Vitamin (MULTIVITAMIN) tablet, Take 1 tablet by mouth daily., Disp: , Rfl:  .  Tetrahydrozoline HCl (VISINE EXTRA OP), Apply 1 drop to eye daily as needed (tired/dry eyes.)., Disp: , Rfl:  .  VITAMIN D, CHOLECALCIFEROL, PO, Take 1 capsule by mouth daily., Disp: , Rfl:  .  VYTORIN 10-40 MG per tablet, Take 1 tablet by mouth at bedtime., Disp: , Rfl: 3   Review of Systems  Constitutional: Positive for malaise/fatigue. Negative for fever, chills, weight loss and diaphoresis.  Eyes: Negative.   Respiratory: Negative.   Cardiovascular: Negative.   Gastrointestinal: Negative.   Genitourinary: Positive for frequency. Negative for dysuria, urgency, hematuria and flank pain.  Musculoskeletal: Positive for myalgias and back pain.  Negative for joint pain, falls and neck pain.  Skin: Negative.   Neurological: Positive for weakness. Negative for dizziness, tingling, tremors, sensory change, speech change, focal weakness, seizures and loss of consciousness.  Endo/Heme/Allergies: Negative.   Psychiatric/Behavioral: Negative.    Vitals  Weight: 180 lb Height: 64in Body Surface Area: 1.87 m Body Mass Index: 30.9 kg/m  BP: 128/82 (Sitting, Left Arm, Standard) HR: 76 bpm  Physical Exam  Constitutional: She appears well-developed.  Musculoskeletal:       Right hip: Normal.       Left hip: Normal.       Right knee: Normal.       Left knee: Normal.       Lumbar back: She exhibits decreased range of motion, tenderness, pain and spasm.  Neurological: No sensory deficit.  Decreased strength bilateral dorsiflexors, left greater than right  Skin: She is not diaphoretic.     Assessment/Plan Lumbar spinal stenosis L4-L5 She needs a lumbar central decompression, L4-L5. Risks and benefits of the procedure discussed with the patient by Dr. Latanya Jacobs.   [name], [place] 03/04/2015, 6:14 PM

## 2015-03-07 ENCOUNTER — Ambulatory Visit (HOSPITAL_COMMUNITY): Payer: Medicare Other

## 2015-03-07 ENCOUNTER — Observation Stay (HOSPITAL_COMMUNITY)
Admission: RE | Admit: 2015-03-07 | Discharge: 2015-03-08 | Disposition: A | Discharge status: Home / Self Care | Payer: Medicare Other | Source: Ambulatory Visit | Attending: Orthopedic Surgery | Admitting: Orthopedic Surgery

## 2015-03-07 ENCOUNTER — Encounter (HOSPITAL_COMMUNITY)
Admission: RE | Disposition: A | Discharge status: Home / Self Care | Payer: Self-pay | Source: Ambulatory Visit | Attending: Orthopedic Surgery

## 2015-03-07 ENCOUNTER — Encounter (HOSPITAL_COMMUNITY): Payer: Self-pay | Admitting: *Deleted

## 2015-03-07 ENCOUNTER — Ambulatory Visit (HOSPITAL_COMMUNITY): Payer: Medicare Other | Admitting: Anesthesiology

## 2015-03-07 DIAGNOSIS — M545 Low back pain: Secondary | ICD-10-CM | POA: Diagnosis present

## 2015-03-07 DIAGNOSIS — Z7951 Long term (current) use of inhaled steroids: Secondary | ICD-10-CM | POA: Insufficient documentation

## 2015-03-07 DIAGNOSIS — K449 Diaphragmatic hernia without obstruction or gangrene: Secondary | ICD-10-CM | POA: Diagnosis not present

## 2015-03-07 DIAGNOSIS — M1990 Unspecified osteoarthritis, unspecified site: Secondary | ICD-10-CM | POA: Insufficient documentation

## 2015-03-07 DIAGNOSIS — Z419 Encounter for procedure for purposes other than remedying health state, unspecified: Secondary | ICD-10-CM

## 2015-03-07 DIAGNOSIS — M48061 Spinal stenosis, lumbar region without neurogenic claudication: Secondary | ICD-10-CM

## 2015-03-07 DIAGNOSIS — I10 Essential (primary) hypertension: Secondary | ICD-10-CM | POA: Insufficient documentation

## 2015-03-07 DIAGNOSIS — Z96662 Presence of left artificial ankle joint: Secondary | ICD-10-CM | POA: Diagnosis not present

## 2015-03-07 DIAGNOSIS — Z79899 Other long term (current) drug therapy: Secondary | ICD-10-CM | POA: Diagnosis not present

## 2015-03-07 DIAGNOSIS — K219 Gastro-esophageal reflux disease without esophagitis: Secondary | ICD-10-CM | POA: Diagnosis not present

## 2015-03-07 DIAGNOSIS — Z96651 Presence of right artificial knee joint: Secondary | ICD-10-CM | POA: Diagnosis not present

## 2015-03-07 DIAGNOSIS — M4806 Spinal stenosis, lumbar region: Principal | ICD-10-CM | POA: Insufficient documentation

## 2015-03-07 DIAGNOSIS — M48062 Spinal stenosis, lumbar region with neurogenic claudication: Secondary | ICD-10-CM | POA: Diagnosis present

## 2015-03-07 HISTORY — PX: LUMBAR LAMINECTOMY/DECOMPRESSION MICRODISCECTOMY: SHX5026

## 2015-03-07 LAB — CBC WITH DIFFERENTIAL/PLATELET
Basophils Absolute: 0 10*3/uL (ref 0.0–0.1)
Basophils Relative: 1 %
Eosinophils Absolute: 0.1 10*3/uL (ref 0.0–0.7)
Eosinophils Relative: 2 %
HCT: 42.7 % (ref 36.0–46.0)
Hemoglobin: 14.6 g/dL (ref 12.0–15.0)
Lymphocytes Relative: 38 %
Lymphs Abs: 2.3 10*3/uL (ref 0.7–4.0)
MCH: 31.7 pg (ref 26.0–34.0)
MCHC: 34.2 g/dL (ref 30.0–36.0)
MCV: 92.6 fL (ref 78.0–100.0)
Monocytes Absolute: 0.4 10*3/uL (ref 0.1–1.0)
Monocytes Relative: 6 %
Neutro Abs: 3.3 10*3/uL (ref 1.7–7.7)
Neutrophils Relative %: 53 %
Platelets: 241 10*3/uL (ref 150–400)
RBC: 4.61 MIL/uL (ref 3.87–5.11)
RDW: 12.3 % (ref 11.5–15.5)
WBC: 6.2 10*3/uL (ref 4.0–10.5)

## 2015-03-07 LAB — TYPE AND SCREEN
ABO/RH(D): O POS
Antibody Screen: NEGATIVE

## 2015-03-07 LAB — PROTIME-INR
INR: 1.01 (ref 0.00–1.49)
Prothrombin Time: 13.5 seconds (ref 11.6–15.2)

## 2015-03-07 SURGERY — LUMBAR LAMINECTOMY/DECOMPRESSION MICRODISCECTOMY 1 LEVEL
Anesthesia: General | Site: Back

## 2015-03-07 MED ORDER — ONDANSETRON HCL 4 MG/2ML IJ SOLN
INTRAMUSCULAR | Status: AC
  Filled 2015-03-07: qty 2

## 2015-03-07 MED ORDER — PROPOFOL 10 MG/ML IV BOLUS
INTRAVENOUS | Status: DC | PRN
  Administered 2015-03-07: 15 mg via INTRAVENOUS

## 2015-03-07 MED ORDER — FENTANYL CITRATE (PF) 100 MCG/2ML IJ SOLN
INTRAMUSCULAR | Status: AC
  Filled 2015-03-07: qty 2

## 2015-03-07 MED ORDER — HYDROCODONE-ACETAMINOPHEN 5-325 MG PO TABS
1.0000 | ORAL_TABLET | ORAL | Status: DC | PRN
  Administered 2015-03-07: 1 via ORAL
  Administered 2015-03-08 (×2): 2 via ORAL
  Filled 2015-03-07: qty 1
  Filled 2015-03-07 (×2): qty 2

## 2015-03-07 MED ORDER — MENTHOL 3 MG MT LOZG: 1.0000 | LOZENGE | OROMUCOSAL | Status: DC | PRN

## 2015-03-07 MED ORDER — DEXAMETHASONE SODIUM PHOSPHATE 10 MG/ML IJ SOLN
INTRAMUSCULAR | Status: DC | PRN
  Administered 2015-03-07: 10 mg via INTRAVENOUS

## 2015-03-07 MED ORDER — LIDOCAINE HCL (CARDIAC) 20 MG/ML IV SOLN
INTRAVENOUS | Status: AC
  Filled 2015-03-07: qty 5

## 2015-03-07 MED ORDER — POLYETHYLENE GLYCOL 3350 17 G PO PACK: 17.0000 g | PACK | Freq: Every day | ORAL | Status: DC | PRN

## 2015-03-07 MED ORDER — FENTANYL CITRATE (PF) 250 MCG/5ML IJ SOLN
INTRAMUSCULAR | Status: DC | PRN
  Administered 2015-03-07 (×5): 50 ug via INTRAVENOUS

## 2015-03-07 MED ORDER — LIDOCAINE HCL (CARDIAC) 20 MG/ML IV SOLN
INTRAVENOUS | Status: DC | PRN
  Administered 2015-03-07: 50 mg via INTRATRACHEAL

## 2015-03-07 MED ORDER — HYDROMORPHONE HCL 1 MG/ML IJ SOLN
INTRAMUSCULAR | Status: AC
  Filled 2015-03-07: qty 1

## 2015-03-07 MED ORDER — EPHEDRINE SULFATE 50 MG/ML IJ SOLN
INTRAMUSCULAR | Status: DC | PRN
  Administered 2015-03-07 (×3): 5 mg via INTRAVENOUS

## 2015-03-07 MED ORDER — LIP MEDEX EX OINT
TOPICAL_OINTMENT | CUTANEOUS | Status: AC
  Filled 2015-03-07: qty 7

## 2015-03-07 MED ORDER — ROCURONIUM BROMIDE 100 MG/10ML IV SOLN
INTRAVENOUS | Status: AC
  Filled 2015-03-07: qty 1

## 2015-03-07 MED ORDER — FLEET ENEMA 7-19 GM/118ML RE ENEM: 1.0000 | ENEMA | Freq: Once | RECTAL | Status: DC | PRN

## 2015-03-07 MED ORDER — LACTATED RINGERS IV SOLN
INTRAVENOUS | Status: DC
  Administered 2015-03-07: 22:00:00 via INTRAVENOUS

## 2015-03-07 MED ORDER — SUCCINYLCHOLINE CHLORIDE 20 MG/ML IJ SOLN
INTRAMUSCULAR | Status: DC | PRN
  Administered 2015-03-07: 100 mg via INTRAVENOUS

## 2015-03-07 MED ORDER — SODIUM CHLORIDE 0.9 % IR SOLN
Status: AC
  Filled 2015-03-07: qty 1

## 2015-03-07 MED ORDER — PROPOFOL 10 MG/ML IV BOLUS
INTRAVENOUS | Status: AC
  Filled 2015-03-07: qty 20

## 2015-03-07 MED ORDER — ONDANSETRON HCL 4 MG/2ML IJ SOLN
INTRAMUSCULAR | Status: DC | PRN
  Administered 2015-03-07: 4 mg via INTRAVENOUS

## 2015-03-07 MED ORDER — BUPIVACAINE-EPINEPHRINE (PF) 0.5% -1:200000 IJ SOLN
INTRAMUSCULAR | Status: AC
  Filled 2015-03-07: qty 30

## 2015-03-07 MED ORDER — DEXAMETHASONE SODIUM PHOSPHATE 10 MG/ML IJ SOLN
INTRAMUSCULAR | Status: AC
  Filled 2015-03-07: qty 1

## 2015-03-07 MED ORDER — LABETALOL HCL 5 MG/ML IV SOLN
INTRAVENOUS | Status: DC | PRN
  Administered 2015-03-07: 5 mg via INTRAVENOUS

## 2015-03-07 MED ORDER — GLYCOPYRROLATE 0.2 MG/ML IJ SOLN
INTRAMUSCULAR | Status: DC | PRN
  Administered 2015-03-07: 0.6 mg via INTRAVENOUS

## 2015-03-07 MED ORDER — ACETAMINOPHEN 325 MG PO TABS: 650.0000 mg | ORAL_TABLET | ORAL | Status: DC | PRN

## 2015-03-07 MED ORDER — BISACODYL 5 MG PO TBEC: 5.0000 mg | DELAYED_RELEASE_TABLET | Freq: Every day | ORAL | Status: DC | PRN

## 2015-03-07 MED ORDER — CHLORHEXIDINE GLUCONATE 4 % EX LIQD: 60.0000 mL | Freq: Once | CUTANEOUS | Status: DC

## 2015-03-07 MED ORDER — EPHEDRINE SULFATE 50 MG/ML IJ SOLN
INTRAMUSCULAR | Status: AC
  Filled 2015-03-07: qty 1

## 2015-03-07 MED ORDER — SODIUM CHLORIDE 0.9 % IJ SOLN
INTRAMUSCULAR | Status: AC
  Filled 2015-03-07: qty 10

## 2015-03-07 MED ORDER — MEPERIDINE HCL 50 MG/ML IJ SOLN
INTRAMUSCULAR | Status: AC
  Filled 2015-03-07: qty 1

## 2015-03-07 MED ORDER — POLYMYXIN B SULFATE 500000 UNITS IJ SOLR
INTRAMUSCULAR | Status: DC | PRN
  Administered 2015-03-07: 500 mL

## 2015-03-07 MED ORDER — HYDROMORPHONE HCL 1 MG/ML IJ SOLN
0.5000 mg | INTRAMUSCULAR | Status: DC | PRN
  Administered 2015-03-07: 0.5 mg via INTRAVENOUS
  Administered 2015-03-07 – 2015-03-08 (×3): 1 mg via INTRAVENOUS
  Filled 2015-03-07 (×3): qty 1

## 2015-03-07 MED ORDER — GLYCOPYRROLATE 0.2 MG/ML IJ SOLN
INTRAMUSCULAR | Status: AC
  Filled 2015-03-07: qty 3

## 2015-03-07 MED ORDER — PHENYLEPHRINE 40 MCG/ML (10ML) SYRINGE FOR IV PUSH (FOR BLOOD PRESSURE SUPPORT)
PREFILLED_SYRINGE | INTRAVENOUS | Status: AC
  Filled 2015-03-07: qty 10

## 2015-03-07 MED ORDER — FENTANYL CITRATE (PF) 250 MCG/5ML IJ SOLN
INTRAMUSCULAR | Status: AC
  Filled 2015-03-07: qty 5

## 2015-03-07 MED ORDER — OXYCODONE-ACETAMINOPHEN 5-325 MG PO TABS
1.0000 | ORAL_TABLET | ORAL | Status: DC | PRN
  Administered 2015-03-08: 2 via ORAL
  Filled 2015-03-07: qty 2

## 2015-03-07 MED ORDER — MEPERIDINE HCL 50 MG/ML IJ SOLN
6.2500 mg | INTRAMUSCULAR | Status: DC | PRN
  Administered 2015-03-07: 6.25 mg via INTRAVENOUS

## 2015-03-07 MED ORDER — BUPIVACAINE-EPINEPHRINE 0.5% -1:200000 IJ SOLN
INTRAMUSCULAR | Status: DC | PRN
  Administered 2015-03-07: 20 mL

## 2015-03-07 MED ORDER — ACETAMINOPHEN 650 MG RE SUPP: 650.0000 mg | RECTAL | Status: DC | PRN

## 2015-03-07 MED ORDER — BACITRACIN-NEOMYCIN-POLYMYXIN 400-5-5000 EX OINT
TOPICAL_OINTMENT | CUTANEOUS | Status: AC
  Filled 2015-03-07: qty 1

## 2015-03-07 MED ORDER — NEOSTIGMINE METHYLSULFATE 10 MG/10ML IV SOLN
INTRAVENOUS | Status: DC | PRN
  Administered 2015-03-07: 4 mg via INTRAVENOUS

## 2015-03-07 MED ORDER — ONDANSETRON HCL 4 MG/2ML IJ SOLN: 4.0000 mg | INTRAMUSCULAR | Status: DC | PRN

## 2015-03-07 MED ORDER — CEFAZOLIN SODIUM-DEXTROSE 2-3 GM-% IV SOLR
2.0000 g | INTRAVENOUS | Status: AC
  Administered 2015-03-07: 2 g via INTRAVENOUS

## 2015-03-07 MED ORDER — PHENOL 1.4 % MT LIQD
1.0000 | OROMUCOSAL | Status: DC | PRN
  Filled 2015-03-07: qty 177

## 2015-03-07 MED ORDER — BUPIVACAINE LIPOSOME 1.3 % IJ SUSP
20.0000 mL | Freq: Once | INTRAMUSCULAR | Status: DC
  Filled 2015-03-07: qty 20

## 2015-03-07 MED ORDER — PHENYLEPHRINE HCL 10 MG/ML IJ SOLN
INTRAMUSCULAR | Status: DC | PRN
  Administered 2015-03-07 (×3): 80 ug via INTRAVENOUS

## 2015-03-07 MED ORDER — LACTATED RINGERS IV SOLN
INTRAVENOUS | Status: DC
  Administered 2015-03-07: 14:00:00 via INTRAVENOUS
  Administered 2015-03-07: 1000 mL via INTRAVENOUS

## 2015-03-07 MED ORDER — BUPIVACAINE LIPOSOME 1.3 % IJ SUSP
INTRAMUSCULAR | Status: DC | PRN
  Administered 2015-03-07: 20 mL

## 2015-03-07 MED ORDER — THROMBIN 5000 UNITS EX SOLR
OROMUCOSAL | Status: DC | PRN
  Administered 2015-03-07: 10 mL via TOPICAL

## 2015-03-07 MED ORDER — THROMBIN 5000 UNITS EX SOLR
CUTANEOUS | Status: AC
  Filled 2015-03-07: qty 10000

## 2015-03-07 MED ORDER — BACITRACIN-NEOMYCIN-POLYMYXIN 400-5-5000 EX OINT
TOPICAL_OINTMENT | CUTANEOUS | Status: DC | PRN
  Administered 2015-03-07: 1 via TOPICAL

## 2015-03-07 MED ORDER — CEFAZOLIN SODIUM-DEXTROSE 2-3 GM-% IV SOLR
INTRAVENOUS | Status: AC
Start: 2015-03-07 — End: 2015-03-07
  Filled 2015-03-07: qty 50

## 2015-03-07 MED ORDER — PROMETHAZINE HCL 25 MG/ML IJ SOLN: 6.2500 mg | INTRAMUSCULAR | Status: DC | PRN

## 2015-03-07 MED ORDER — FENTANYL CITRATE (PF) 100 MCG/2ML IJ SOLN
25.0000 ug | INTRAMUSCULAR | Status: DC | PRN
  Administered 2015-03-07 (×2): 50 ug via INTRAVENOUS

## 2015-03-07 MED ORDER — METHOCARBAMOL 1000 MG/10ML IJ SOLN
500.0000 mg | Freq: Four times a day (QID) | INTRAVENOUS | Status: DC | PRN
  Administered 2015-03-07: 500 mg via INTRAVENOUS
  Filled 2015-03-07 (×2): qty 5

## 2015-03-07 MED ORDER — LABETALOL HCL 5 MG/ML IV SOLN
INTRAVENOUS | Status: AC
  Filled 2015-03-07: qty 4

## 2015-03-07 MED ORDER — CEFAZOLIN SODIUM 1-5 GM-% IV SOLN
1.0000 g | Freq: Three times a day (TID) | INTRAVENOUS | Status: DC
  Administered 2015-03-07 – 2015-03-08 (×2): 1 g via INTRAVENOUS
  Filled 2015-03-07 (×3): qty 50

## 2015-03-07 MED ORDER — METHOCARBAMOL 500 MG PO TABS
500.0000 mg | ORAL_TABLET | Freq: Four times a day (QID) | ORAL | Status: DC | PRN
  Administered 2015-03-08: 500 mg via ORAL
  Filled 2015-03-07: qty 1

## 2015-03-07 MED ORDER — ROCURONIUM BROMIDE 100 MG/10ML IV SOLN
INTRAVENOUS | Status: DC | PRN
  Administered 2015-03-07: 10 mg via INTRAVENOUS
  Administered 2015-03-07: 5 mg via INTRAVENOUS
  Administered 2015-03-07: 30 mg via INTRAVENOUS

## 2015-03-07 SURGICAL SUPPLY — 38 items
BAG SPEC THK2 15X12 ZIP CLS (MISCELLANEOUS)
BAG ZIPLOCK 12X15 (MISCELLANEOUS) IMPLANT
CLEANER TIP ELECTROSURG 2X2 (MISCELLANEOUS) ×2 IMPLANT
DRAPE MICROSCOPE LEICA (MISCELLANEOUS) ×2 IMPLANT
DRAPE POUCH INSTRU U-SHP 10X18 (DRAPES) ×1 IMPLANT
DRAPE SHEET LG 3/4 BI-LAMINATE (DRAPES) ×2 IMPLANT
DRAPE SURG 17X11 SM STRL (DRAPES) ×2 IMPLANT
DRSG ADAPTIC 3X8 NADH LF (GAUZE/BANDAGES/DRESSINGS) ×2 IMPLANT
DRSG PAD ABDOMINAL 8X10 ST (GAUZE/BANDAGES/DRESSINGS) ×6 IMPLANT
DURAPREP 26ML APPLICATOR (WOUND CARE) ×2 IMPLANT
ELECT BLADE TIP CTD 4 INCH (ELECTRODE) ×2 IMPLANT
ELECT REM PT RETURN 9FT ADLT (ELECTROSURGICAL) ×2
ELECTRODE REM PT RTRN 9FT ADLT (ELECTROSURGICAL) ×1 IMPLANT
GAUZE SPONGE 4X4 12PLY STRL (GAUZE/BANDAGES/DRESSINGS) ×2 IMPLANT
GLOVE BIOGEL PI IND STRL 8 (GLOVE) ×1 IMPLANT
GLOVE BIOGEL PI INDICATOR 8 (GLOVE) ×1
GLOVE ECLIPSE 8.0 STRL XLNG CF (GLOVE) ×6 IMPLANT
GOWN STRL REUS W/TWL XL LVL3 (GOWN DISPOSABLE) ×5 IMPLANT
KIT BASIN OR (CUSTOM PROCEDURE TRAY) ×2 IMPLANT
KIT POSITIONING SURG ANDREWS (MISCELLANEOUS) ×1 IMPLANT
MANIFOLD NEPTUNE II (INSTRUMENTS) ×2 IMPLANT
MARKER SKIN DUAL TIP RULER LAB (MISCELLANEOUS) ×2 IMPLANT
NDL SPNL 18GX3.5 QUINCKE PK (NEEDLE) ×2 IMPLANT
NEEDLE HYPO 22GX1.5 SAFETY (NEEDLE) ×3 IMPLANT
NEEDLE SPNL 18GX3.5 QUINCKE PK (NEEDLE) ×4 IMPLANT
PACK LAMINECTOMY ORTHO (CUSTOM PROCEDURE TRAY) ×2 IMPLANT
PATTIES SURGICAL .5 X.5 (GAUZE/BANDAGES/DRESSINGS) IMPLANT
PATTIES SURGICAL .75X.75 (GAUZE/BANDAGES/DRESSINGS) ×2 IMPLANT
PATTIES SURGICAL 1X1 (DISPOSABLE) ×2 IMPLANT
SPONGE LAP 4X18 X RAY DECT (DISPOSABLE) ×4 IMPLANT
SPONGE SURGIFOAM ABS GEL 100 (HEMOSTASIS) ×2 IMPLANT
STAPLER VISISTAT 35W (STAPLE) ×2 IMPLANT
SUT VIC AB 0 CT1 27 (SUTURE) ×2
SUT VIC AB 0 CT1 27XBRD ANTBC (SUTURE) ×1 IMPLANT
SUT VIC AB 1 CT1 27 (SUTURE) ×6
SUT VIC AB 1 CT1 27XBRD ANTBC (SUTURE) ×3 IMPLANT
SYR 20CC LL (SYRINGE) ×4 IMPLANT
TOWEL OR 17X26 10 PK STRL BLUE (TOWEL DISPOSABLE) ×2 IMPLANT

## 2015-03-07 NOTE — Transfer of Care (Signed)
Immediate Anesthesia Transfer of Care Note  Patient: [name]  Procedure(s) Performed: Procedure(s): COMPLETE DECOMPRESSION/LUMBAR LAMINECTOMY L4 - L5 FOR STENOSIS 1 LEVEL (N/A)  Patient Location: PACU  Anesthesia Type:General  Level of Consciousness: awake and patient cooperative  Airway & Oxygen Therapy: Patient Spontanous Breathing and Patient connected to face mask oxygen  Post-op Assessment: Report given to RN and Post -op Vital signs reviewed and stable  Post vital signs: Reviewed and stable  Last Vitals:  Filed Vitals:   03/07/15 0833  BP: 135/82  Pulse: 84  Temp: 36.5 C  Resp: 18    Complications: No apparent anesthesia complications

## 2015-03-07 NOTE — Interval H&P Note (Signed)
History and Physical Interval Note:  03/07/2015 11:06 AM  [name]  has presented today for surgery, with the diagnosis of Lumbar Spinal Stenosis  The various methods of treatment have been discussed with the patient and family. After consideration of risks, benefits and other options for treatment, the patient has consented to  Procedure(s): COMPLETE DECOMPRESSION/LUMBAR LAMINECTOMY L4 - L5 FOR STENOSIS 1 LEVEL (N/A) as a surgical intervention .  The patient's history has been reviewed, patient examined, no change in status, stable for surgery.  I have reviewed the patient's chart and labs.  Questions were answered to the patient's satisfaction.     Thorn Demas A

## 2015-03-07 NOTE — Anesthesia Postprocedure Evaluation (Signed)
Anesthesia Post Note  Patient: [name]  Procedure(s) Performed: Procedure(s) (LRB): COMPLETE DECOMPRESSION/LUMBAR LAMINECTOMY L4 - L5 FOR STENOSIS 1 LEVEL (N/A)  Patient location during evaluation: PACU Anesthesia Type: General Level of consciousness: awake and alert Pain management: pain level controlled Vital Signs Assessment: post-procedure vital signs reviewed and stable Respiratory status: spontaneous breathing, nonlabored ventilation, respiratory function stable and patient connected to nasal cannula oxygen Cardiovascular status: blood pressure returned to baseline and stable Postop Assessment: no signs of nausea or vomiting Anesthetic complications: no    Last Vitals:  Filed Vitals:   03/07/15 0833  BP: 135/82  Pulse: 84  Temp: 36.5 C  Resp: 18    Last Pain:  Filed Vitals:   03/07/15 0903  PainSc: 3                  Montez Hageman

## 2015-03-07 NOTE — Brief Op Note (Signed)
03/07/2015  1:31 PM  PATIENT:  [name]  71 y.o. 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  POST-OPERATIVE DIAGNOSIS:  Same as Pre-Op  PROCEDURE:  Procedure(s): COMPLETE DECOMPRESSION/LUMBAR LAMINECTOMY L4 - L5 FOR STENOSIS 1 LEVEL (N/A) with Foraminotomies Bilaterally at TWO LEVELS for Foraminal Stenosis.  SURGEON:  Surgeon(s) and Role:    * Latanya Maudlin, MD - Primary    * Susa Day, MD - Assisting    ASSISTANTS: Susa Day MD   ANESTHESIA:   general  EBL:  Total I/O In: 900 [I.V.:900] Out: 100 [Blood:100]  BLOOD ADMINISTERED:none  DRAINS: none   LOCAL MEDICATIONS USED:  MARCAINE 20cc of 0.50% with Epinephrine at start of the case and 20cc of Exparel at the end of the case.    SPECIMEN:  No Specimen  DISPOSITION OF SPECIMEN:  N/A  COUNTS:  YES  TOURNIQUET:  * No tourniquets in log *  DICTATION: .Other Dictation: Dictation Number [id number]  PLAN OF CARE: Admit for overnight observation  PATIENT DISPOSITION:  PACU - hemodynamically stable.   Delay start of Pharmacological VTE agent (>24hrs) due to surgical blood loss or risk of bleeding: yes

## 2015-03-07 NOTE — Anesthesia Preprocedure Evaluation (Addendum)
Anesthesia Evaluation  Patient identified by MRN, date of birth, ID band Patient awake    Reviewed: Allergy & Precautions, NPO status , Patient's Chart, lab work & pertinent test results  Airway Mallampati: II  TM Distance: >3 FB Neck ROM: Full    Dental no notable dental hx.    Pulmonary neg pulmonary ROS,    Pulmonary exam normal breath sounds clear to auscultation       Cardiovascular hypertension, Pt. on medications Normal cardiovascular exam Rhythm:Regular Rate:Normal     Neuro/Psych negative neurological ROS  negative psych ROS   GI/Hepatic Neg liver ROS, GERD  Medicated and Controlled,  Endo/Other  negative endocrine ROS  Renal/GU negative Renal ROS  negative genitourinary   Musculoskeletal negative musculoskeletal ROS (+)   Abdominal   Peds negative pediatric ROS (+)  Hematology negative hematology ROS (+)   Anesthesia Other Findings   Reproductive/Obstetrics negative OB ROS                            Anesthesia Physical Anesthesia Plan  ASA: II  Anesthesia Plan: General   Post-op Pain Management:    Induction: Intravenous  Airway Management Planned: Oral ETT  Additional Equipment:   Intra-op Plan:   Post-operative Plan: Extubation in OR  Informed Consent: I have reviewed the patients History and Physical, chart, labs and discussed the procedure including the risks, benefits and alternatives for the proposed anesthesia with the patient or authorized representative who has indicated his/her understanding and acceptance.   Dental advisory given  Plan Discussed with: CRNA  Anesthesia Plan Comments:         Anesthesia Quick Evaluation

## 2015-03-07 NOTE — Progress Notes (Signed)
Demerol 6.25 mg IVP given for shaking of legs

## 2015-03-07 NOTE — Interval H&P Note (Signed)
History and Physical Interval Note:  03/07/2015 11:01 AM  [name]  has presented today for surgery, with the diagnosis of Lumbar Spinal Stenosis  The various methods of treatment have been discussed with the patient and family. After consideration of risks, benefits and other options for treatment, the patient has consented to  Procedure(s): COMPLETE DECOMPRESSION/LUMBAR LAMINECTOMY L4 - L5 FOR STENOSIS 1 LEVEL (N/A) as a surgical intervention .  The patient's history has been reviewed, patient examined, no change in status, stable for surgery.  I have reviewed the patient's chart and labs.  Questions were answered to the patient's satisfaction.     Roald Lukacs A

## 2015-03-07 NOTE — Op Note (Signed)
[name], [name]             ACCOUNT NO.:  [id number]  MEDICAL RECORD NO.:  [id number]  LOCATION:  47                         FACILITY:  [facility]  PHYSICIAN:  Kipp Brood. Magalie Almon, M.D.DATE OF BIRTH:  09/19/1945  DATE OF PROCEDURE:  03/07/2015 DATE OF DISCHARGE:                              OPERATIVE REPORT   SURGEON:  Kipp Brood. Gladstone Lighter, M.D.  ASSISTANT:  Susa Day, M.D.  PREOPERATIVE DIAGNOSES: 1. Spinal stenosis with just about a complete block at L4-5. 2. Foraminal stenosis bilaterally involving the L4 root. 3. Foraminal stenosis at L5 bilaterally involving the L5 root.  POSTOPERATIVE DIAGNOSES: 1. Spinal stenosis with just about a complete block at L4-5. 2. Foraminal stenosis bilaterally involving the L4 root. 3. Foraminal stenosis at L5 bilaterally involving the L5 root.  OPERATION: 1. Complete decompressive lumbar laminectomy at L4-5 for spinal     stenosis. 2. Foraminotomy for the L4 root bilaterally. 3. Foraminotomy for the L5 root bilaterally. Note, this is extremely difficult case because the ligamentum flavum on the left was severely adherent to the dura.  We had to gently peel most of that off, and we went out far laterally to decompress the recess to make sure there was no pressure, and we also did foraminotomy.  DESCRIPTION OF PROCEDURE:  Under general anesthesia, routine orthopedic prep and draping of the lower back carried out, the appropriate time-out was carried out.  Also placed two needles in the back for localized purposes and x-ray was taken.  The patient had 2 g of IV Ancef.  At this time, an incision was made over L3-4, L4-5 spaces, bleeders were identified and cauterized.  I then stripped the muscle from the lamina and spinous process bilaterally.  At this particular time, the self- retaining retractors were inserted, another x-ray was taken prior to doing that.  We then removed spinous process of L4, we went down centrally at L4-5 and did  our decompression.  The microscope was brought in.  We noted that the ligamentum flavum was extremely adherent to the dura laterally on the left.  At this time, we gently went above the ligamentum flavum, we went up, took part of the facet, went out far laterally and decompressed the recess.  We were now able to completely free up the dura.  We did utilize the curettes to help curette the lateral margins to avoid any stress or any pulling on the dura where the ligamentum was adhered.  We went up proximally as far as we needed to go.  We utilized the hockey stick to make sure we were open proximally. We went out into the foramina bilaterally and did foraminotomies as well.  We then went distally as well, removed some of the lamina as well distally.  When we completed the procedure, the dura now was wide open. There was still small part of the ligamentum flavum adhered that we did not feel was safe to remove.  But, we thoroughly decompressed the dura even though that was adherent posteriorly.  We then out laterally to make sure we had everything decompressed and the bone was about exerting any pressure over the dura.  We took another x-ray to verify the position.  We thoroughly irrigated out the area and loosely applied some thrombin-soaked Gelfoam and closed back in layers in the usual fashion except I left a small distal and proximal deep part open for drainage purposes.  I injected initially before we made the incision 20 mL of 0.5% Marcaine with epinephrine.  At the beginning and at the end of the case, I injected 20 mL of Exparel into the muscle and soft tissue.  The wound as I mentioned then was closed in layers.  The skin was closed with metal staples with sterile Neosporin dressing was applied.  She had 2 g of IV Ancef.          ______________________________ Kipp Brood. Gladstone Lighter, M.D.     RAG/MEDQ  D:  03/07/2015  T:  03/07/2015  Job:  [id number]

## 2015-03-07 NOTE — Anesthesia Procedure Notes (Signed)
Procedure Name: Intubation Date/Time: 03/07/2015 11:45 AM Performed by: Dione Booze Pre-anesthesia Checklist: Emergency Drugs available, Suction available, Patient being monitored and Patient identified Patient Re-evaluated:Patient Re-evaluated prior to inductionOxygen Delivery Method: Circle system utilized Preoxygenation: Pre-oxygenation with 100% oxygen Intubation Type: IV induction Laryngoscope Size: Mac and 4 Grade View: Grade II Tube type: Oral Tube size: 7.5 mm Number of attempts: 1 Airway Equipment and Method: Stylet Placement Confirmation: ETT inserted through vocal cords under direct vision,  breath sounds checked- equal and bilateral and positive ETCO2 Secured at: 20 cm Tube secured with: Tape Dental Injury: Teeth and Oropharynx as per pre-operative assessment

## 2015-03-08 DIAGNOSIS — M4806 Spinal stenosis, lumbar region: Secondary | ICD-10-CM | POA: Diagnosis not present

## 2015-03-08 MED ORDER — METHOCARBAMOL 500 MG PO TABS: 500.0000 mg | ORAL_TABLET | Freq: Four times a day (QID) | ORAL | Status: DC | PRN

## 2015-03-08 MED ORDER — HYDROCODONE-ACETAMINOPHEN 5-325 MG PO TABS: 1.0000 | ORAL_TABLET | ORAL | Status: DC | PRN

## 2015-03-08 NOTE — Progress Notes (Signed)
Pt to d/c home. AVS reviewed and "My Chart" discussed with pt. Pt capable of verbalizing medications, dressing changes, signs and symptoms of infection, and follow-up appointments. Voiding independently now, with no issues. Remains hemodynamically stable. No signs and symptoms of distress. Educated pt to return to ER in the case of SOB, dizziness, or chest pain.

## 2015-03-08 NOTE — Evaluation (Signed)
Occupational Therapy Evaluation Patient Details Name: [name] MRN: [id number] DOB: [birth year] Today's Date: 03/08/2015    History of Present Illness s/p L4-5 decompression   Clinical Impression   This 70 year old female was admitted for the above surgery. All education was completed. No further OT is needed at this time    Follow Up Recommendations  Supervision/Assistance - 24 hour    Equipment Recommendations  None recommended by OT    Recommendations for Other Services       Precautions / Restrictions Precautions Precautions: Back Precaution Comments: handout provided, reviewed  Restrictions Weight Bearing Restrictions: No      Mobility Bed Mobility             General bed mobility comments: oob  Transfers Overall transfer level: Needs assistance Equipment used: Rolling walker (2 wheeled) Transfers: Sit to/from Stand Sit to Stand: Supervision         General transfer comment: cues for hand placenment    Balance                                            ADL Overall ADL's : Needs assistance/impaired     Grooming: Wash/dry hands;Oral care;Supervision/safety;Standing   Upper Body Bathing: Set up;Supervision/ safety;Sitting   Lower Body Bathing: Moderate assistance;Sit to/from stand   Upper Body Dressing : Set up;Supervision/safety;Sitting   Lower Body Dressing: Maximal assistance;Sit to/from stand;With adaptive equipment   Toilet Transfer: Supervision/safety;BSC;RW;Ambulation   Toileting- Clothing Manipulation and Hygiene: Minimal assistance;Sit to/from stand         General ADL Comments: pt usually crosses legs for ADLs but unable to do so now. Educated on alternative methods, AE and having assistance. She will have 24/7 at home.  Educated on backing into shower if she is using the RW.  Pt verbalizes understanding of all back precautions; cues given for these during ADLs, and she practiced toilet transfer twice,  to keep back straight.  Pt was OOB.  Verbalizes log rolling     Vision     Perception     Praxis      Pertinent Vitals/Pain Pain Assessment: 0-10 Pain Score: 3  Pain Location: back Pain Descriptors / Indicators: Aching;Discomfort     Hand Dominance Right   Extremity/Trunk Assessment Upper Extremity Assessment Upper Extremity Assessment: Overall WFL for tasks assessed          Communication Communication Communication: No difficulties   Cognition Arousal/Alertness: Awake/alert Behavior During Therapy: WFL for tasks assessed/performed Overall Cognitive Status: Within Functional Limits for tasks assessed                     General Comments       Exercises       Shoulder Instructions      Home Living Family/patient expects to be discharged to:: Private residence Living Arrangements: Alone Available Help at Discharge: Family;Available 24 hours/day Type of Home: House Home Access: Stairs to enter [employer] of Steps: 3 + LAND[place]  Entrance Stairs-Rails: Left Home Layout: Two level;Able to live on main level with bedroom/bathroom     Bathroom Shower/Tub: Walk-in shower   Bathroom Toilet: Handicapped height     Home Equipment: Environmental consultant - 2 wheels;Shower seat;Shower seat - built in;Bedside commode          Prior Functioning/Environment Level of Independence: Independent  OT Diagnosis: Acute pain   OT Problem List:     OT Treatment/Interventions:      OT Goals(Current goals can be found in the care plan section) Acute Rehab OT Goals Patient Stated Goal: go home OT Goal Formulation: All assessment and education complete, DC therapy  OT Frequency:     Barriers to D/C:            Co-evaluation              End of Session    Activity Tolerance: Patient tolerated treatment well Patient left:  (handed off to PT)   [id number] OT Time Calculation (min): 23 min Charges:  OT General Charges $OT  Visit: 1 Procedure OT Evaluation $OT Eval Low Complexity: 1 Procedure G-Codes: OT G-codes **NOT FOR INPATIENT CLASS** Functional Assessment Tool Used: clinical judgment and observation Functional Limitation: Self care Self Care Current Status [id number]): At least 60 percent but less than 80 percent impaired, limited or restricted Self Care Goal Status [id number]): At least 60 percent but less than 80 percent impaired, limited or restricted Self Care Discharge Status [phone]): At least 60 percent but less than 80 percent impaired, limited or restricted  [facility] 03/08/2015, 9:29 AM Lesle Chris, OTR/L [phone] 03/08/2015

## 2015-03-08 NOTE — Evaluation (Signed)
Physical Therapy Evaluation Patient Details Name: [name] MRN: [id number] DOB: [birth year] Today's Date: 03/08/2015   History of Present Illness  s/p L4-5 decompression  Clinical Impression  Patient is mobilizing well. No further PT needs.    Follow Up Recommendations No PT follow up    Equipment Recommendations  None recommended by PT    Recommendations for Other Services       Precautions / Restrictions Precautions Precautions: Back Precaution Comments: handout provided, reviewed  Restrictions Weight Bearing Restrictions: No      Mobility  Bed Mobility Overal bed mobility: Needs Assistance Bed Mobility: Sit to Sidelying              Transfers Overall transfer level: Needs assistance Equipment used: Rolling walker (2 wheeled) Transfers: Sit to/from Stand Sit to Stand: Supervision         General transfer comment: cues for hand placenment  Ambulation/Gait Ambulation/Gait assistance: Supervision   Assistive device: Rolling walker (2 wheeled);None       General Gait Details: 200' with RW and 200' without RW,.  Stairs Stairs: Yes Stairs assistance: Supervision Stair Management: One rail Right Number of Stairs: 2 General stair comments: cues for sequence  Wheelchair Mobility    Modified Rankin (Stroke Patients Only)       Balance                                             Pertinent Vitals/Pain Pain Assessment: 0-10 Pain Score: 3  Pain Location: back Pain Descriptors / Indicators: Aching;Discomfort    Home Living Family/patient expects to be discharged to:: Private residence Living Arrangements: Alone Available Help at Discharge: Family;Available 24 hours/day Type of Home: House Home Access: Stairs to enter Entrance Stairs-Rails: Left Entrance Stairs-Number of Steps: 3 + LANDING  Home Layout: Two level;Able to live on main level with bedroom/bathroom Home Equipment: Gilford Rile - 2 wheels;Shower seat;Shower  seat - built in;Bedside commode      Prior Function Level of Independence: Independent               Hand Dominance   Dominant Hand: Right    Extremity/Trunk Assessment   Upper Extremity Assessment: Overall WFL for tasks assessed           Lower Extremity Assessment: Overall WFL for tasks assessed         Communication   Communication: No difficulties  Cognition Arousal/Alertness: Awake/alert Behavior During Therapy: WFL for tasks assessed/performed Overall Cognitive Status: Within Functional Limits for tasks assessed                      General Comments      Exercises        Assessment/Plan    PT Assessment Patent does not need any further PT services  PT Diagnosis Difficulty walking   PT Problem List    PT Treatment Interventions     PT Goals (Current goals can be found in the Care Plan section) Acute Rehab PT Goals Patient Stated Goal: go home PT Goal Formulation: All assessment and education complete, DC therapy    Frequency     Barriers to discharge        Co-evaluation               End of Session   Activity Tolerance: Patient tolerated treatment well Patient left: in chair;with  call bell/phone within reach;with nursing/sitter in room Nurse Communication: Mobility status    Functional Assessment Tool Used: clinical judgement Functional Limitation: Mobility: Walking and moving around Mobility: Walking and Moving Around Current Status [id number]): At least 1 percent but less than 20 percent impaired, limited or restricted Mobility: Walking and Moving Around Goal Status [phone]): At least 1 percent but less than 20 percent impaired, limited or restricted Mobility: Walking and Moving Around Discharge Status [phone]): At least 1 percent but less than 20 percent impaired, limited or restricted    Time: 0855-0915 PT Time Calculation (min) (ACUTE ONLY): 20 min   Charges:   PT Evaluation $PT Eval Low Complexity: 1 Procedure      PT G Codes:   PT G-Codes **NOT FOR INPATIENT CLASS** Functional Assessment Tool Used: clinical judgement Functional Limitation: Mobility: Walking and moving around Mobility: Walking and Moving Around Current Status [id number]): At least 1 percent but less than 20 percent impaired, limited or restricted Mobility: Walking and Moving Around Goal Status [phone]): At least 1 percent but less than 20 percent impaired, limited or restricted Mobility: Walking and Moving Around Discharge Status [phone]): At least 1 percent but less than 20 percent impaired, limited or restricted    Claretha Cooper 03/08/2015, 9:26 AM Tresa Endo PT [phone]

## 2015-03-08 NOTE — Progress Notes (Signed)
Subjective: 1 Day Post-Op Procedure(s) (LRB): COMPLETE DECOMPRESSION/LUMBAR LAMINECTOMY L4 - L5 FOR STENOSIS 1 LEVEL (N/A) Patient reports pain as 2 on 0-10 scale.Doing well except for slight difficulty voiding. Once she is up with Pt she should be fine.Will DC    Objective: Vital signs in last 24 hours: Temp:  [97.7 F (36.5 C)-98.8 F (37.1 C)] 98.4 F (36.9 C) (01/12 0545) Pulse Rate:  [72-108] 98 (01/12 0545) Resp:  [7-18] 15 (01/12 0545) BP: (96-135)/(50-90) 123/73 mmHg (01/12 0545) SpO2:  [91 %-100 %] 91 % (01/12 0545) Weight:  [86.637 kg (191 lb)] 86.637 kg (191 lb) (01/11 0902)  Intake/Output from previous day: 01/11 0701 - 01/12 0700 In: 3105 [P.O.:700; I.V.:2350; IV Piggyback:55] Out: 600 [Urine:500; Blood:100] Intake/Output this shift:     Recent Labs  03/07/15 0915  HGB 14.6    Recent Labs  03/07/15 0915  WBC 6.2  RBC 4.61  HCT 42.7  PLT 241   No results for input(s): NA, K, CL, CO2, BUN, CREATININE, GLUCOSE, CALCIUM in the last 72 hours.  Recent Labs  03/07/15 0915  INR 1.01    Neurologically intact Dorsiflexion/Plantar flexion intact  Assessment/Plan: 1 Day Post-Op Procedure(s) (LRB): COMPLETE DECOMPRESSION/LUMBAR LAMINECTOMY L4 - L5 FOR STENOSIS 1 LEVEL (N/A) Up with therapy Discharge home with home health Discharge to SNF  [name] A 03/08/2015, 7:10 AM

## 2015-03-08 NOTE — Discharge Instructions (Signed)
No lifting or bending °For the first few days, remove your dressing, tape a piece of saran wrap over your incision.  °Take your shower, then remove the saran wrap and put a clean dressing on. °After three days you can shower without the saran wrap.  °Take aspirin 325mg daily to prevent blood clots °Call Dr. Gioffre if any wound complications or temperature of 101 degrees F or over.  °Call the office for an appointment to see Dr. Gioffre in two weeks: [phone] and ask for Dr. Gioffre's nurse, Tammy Johnson.  °

## 2015-03-12 NOTE — Discharge Summary (Signed)
Physician Discharge Summary   Patient ID: [name] MRN: [id number] DOB/AGE: [birth year] 70 y.o.  Admit date: 03/07/2015 Discharge date: 03/08/2015  Primary Diagnosis: Lumbar spinal stenosis  Admission Diagnoses:  Past Medical History  Diagnosis Date  . Allergic rhinitis, cause unspecified   . Arthritis   . Esophageal stenosis   . Hiatal hernia   . Unspecified essential hypertension     PT STOPPED BP MED WITH PCP KNOWLEDGE  . History of nonmelanoma skin cancer   . Cancer ([place])     hx skin cancer  . Pneumonia     2015  . Leg fracture, right     2015  . History of bronchitis as a child   . History of urinary tract infection   . GERD (gastroesophageal reflux disease)   . Wears glasses   . Multiple falls    Discharge Diagnoses:   Active Problems:   Spinal stenosis, lumbar region, with neurogenic claudication  Estimated body mass index is 32.77 kg/(m^2) as calculated from the following:   Height as of this encounter: 5' 4"  (1.626 m).   Weight as of this encounter: 86.637 kg (191 lb).  Procedure:  Procedure(s) (LRB): COMPLETE DECOMPRESSION/LUMBAR LAMINECTOMY L4 - L5 FOR STENOSIS 1 LEVEL (N/A)   Consults: None  HPI: The patient presented with the chief complaint of low back pain. She had a couple episodes of bowel incontinence. Eight weeks ago, she was on a ladder, fell several feet and landed on her buttocks. She has no sciatic-type complaints. She had no numbness, no tingling down her legs. CT showed severe stenosis at L4-L5.  Laboratory Data: Admission on 03/07/2015, Discharged on 03/08/2015  Component Date Value Ref Range Status  . WBC 03/07/2015 6.2  4.0 - 10.5 K/uL Final  . RBC 03/07/2015 4.61  3.87 - 5.11 MIL/uL Final  . Hemoglobin 03/07/2015 14.6  12.0 - 15.0 g/dL Final  . HCT 03/07/2015 42.7  36.0 - 46.0 % Final  . MCV 03/07/2015 92.6  78.0 - 100.0 fL Final  . MCH 03/07/2015 31.7  26.0 - 34.0 pg Final  . MCHC 03/07/2015 34.2  30.0 - 36.0 g/dL Final  .  RDW 03/07/2015 12.3  11.5 - 15.5 % Final  . Platelets 03/07/2015 241  150 - 400 K/uL Final  . Neutrophils Relative % 03/07/2015 53   Final  . Neutro Abs 03/07/2015 3.3  1.7 - 7.7 K/uL Final  . Lymphocytes Relative 03/07/2015 38   Final  . Lymphs Abs 03/07/2015 2.3  0.7 - 4.0 K/uL Final  . Monocytes Relative 03/07/2015 6   Final  . Monocytes Absolute 03/07/2015 0.4  0.1 - 1.0 K/uL Final  . Eosinophils Relative 03/07/2015 2   Final  . Eosinophils Absolute 03/07/2015 0.1  0.0 - 0.7 K/uL Final  . Basophils Relative 03/07/2015 1   Final  . Basophils Absolute 03/07/2015 0.0  0.0 - 0.1 K/uL Final  . Prothrombin Time 03/07/2015 13.5  11.6 - 15.2 seconds Final  . INR 03/07/2015 1.01  0.00 - 1.49 Final  . ABO/RH(D) 03/07/2015 O POS   Final  . Antibody Screen 03/07/2015 NEG   Final  . Sample Expiration 03/07/2015 03/10/2015   Final  Hospital Outpatient Visit on 02/21/2015  Component Date Value Ref Range Status  . Sodium 02/21/2015 143  135 - 145 mmol/L Final  . Potassium 02/21/2015 4.5  3.5 - 5.1 mmol/L Final  . Chloride 02/21/2015 105  101 - 111 mmol/L Final  . CO2 02/21/2015 29  22 -  32 mmol/L Final  . Glucose, Bld 02/21/2015 106* 65 - 99 mg/dL Final  . BUN 02/21/2015 15  6 - 20 mg/dL Final  . Creatinine, Ser 02/21/2015 0.84  0.44 - 1.00 mg/dL Final  . Calcium 02/21/2015 9.8  8.9 - 10.3 mg/dL Final  . GFR calc non Af Amer 02/21/2015 >60  >60 mL/min Final  . GFR calc Af Amer 02/21/2015 >60  >60 mL/min Final   Comment: (NOTE) The eGFR has been calculated using the CKD EPI equation. This calculation has not been validated in all clinical situations. eGFR's persistently <60 mL/min signify possible Chronic Kidney Disease.   . Anion gap 02/21/2015 9  5 - 15 Final  . WBC 02/21/2015 6.3  4.0 - 10.5 K/uL Final  . RBC 02/21/2015 4.65  3.87 - 5.11 MIL/uL Final  . Hemoglobin 02/21/2015 14.6  12.0 - 15.0 g/dL Final  . HCT 02/21/2015 42.9  36.0 - 46.0 % Final  . MCV 02/21/2015 92.3  78.0 - 100.0 fL  Final  . MCH 02/21/2015 31.4  26.0 - 34.0 pg Final  . MCHC 02/21/2015 34.0  30.0 - 36.0 g/dL Final  . RDW 02/21/2015 12.3  11.5 - 15.5 % Final  . Platelets 02/21/2015 272  150 - 400 K/uL Final  . MRSA, PCR 02/21/2015 NEGATIVE  NEGATIVE Final  . Staphylococcus aureus 02/21/2015 NEGATIVE  NEGATIVE Final   Comment:        The Xpert SA Assay (FDA approved for NASAL specimens in patients over 69 years of age), is one component of a comprehensive surveillance program.  Test performance has been validated by [facility] for patients greater than or equal to 74 year old. It is not intended to diagnose infection nor to guide or monitor treatment.      X-Rays:Dg Lumbar Spine 2-3 Views  03/07/2015  CLINICAL DATA:  Preoperative examination. Patient for lumbar surgery. EXAM: LUMBAR SPINE - 2-3 VIEW COMPARISON:  Postmyelogram CT scan 02/09/2015. FINDINGS: Vertebral body height and alignment are maintained. Facet degenerative disease is seen at L4-5 and L5-S1. Scattered mild anterior endplate spurring is most notable at T11-12. Paraspinous structures are unremarkable. IMPRESSION: No acute abnormality.  Lumbar spondylosis. Electronically Signed   By: Inge Rise M.D.   On: 03/07/2015 09:53   Dg Spine Portable 1 View  03/07/2015  CLINICAL DATA:  L4-5 decompression. Intraoperative localization films. EXAM: PORTABLE SPINE - 1 VIEW COMPARISON:  Intraoperative localization film earlier today. FINDINGS: Two probes are in place. The more superior probe is at the level of the inferior endplate of L4. The more inferior probe projects at the level of the L5 pedicles. IMPRESSION: Localization as above. Electronically Signed   By: Inge Rise M.D.   On: 03/07/2015 13:29   Dg Spine Portable 1 View  03/07/2015  CLINICAL DATA:  Intra op L4-5 decompression. EXAM: PORTABLE SPINE - 1 VIEW COMPARISON:  None. FINDINGS: Single cross-table lateral view of the lumbar spine is provided. There is a metallic instrument  in the posterior paraspinal soft tissues with the tip projecting over the L4 spinous process. Degenerative disc disease at L2-3. IMPRESSION: Intraoperative localization x-rays as detailed above. Electronically Signed   By: Kathreen Devoid   On: 03/07/2015 12:40   Dg Spine Portable 1 View  03/07/2015  CLINICAL DATA:  L4-5 decompression. EXAM: PORTABLE SPINE - 1 VIEW COMPARISON:  03/07/2015, earlier the same day. FINDINGS: Same numbering scheme used on today's study as was employed on the CT myelogram from 02/09/2015. This documents spinal  needles in the posterior soft tissues of the lower back. The more cranial of the 2 needle is is positioned with the tip overlying the L4 spinous process. The more caudal of the 2 needles has the tip positioned just posterior to the L5 spinous process. IMPRESSION: Intraoperative localization. Electronically Signed   By: Misty Stanley M.D.   On: 03/07/2015 12:17    EKG: Orders placed or performed in visit on 12/23/13  . EKG 12-Lead     Hospital Course: [name] is a 70 y.o. who was admitted to [facility]. They were brought to the operating room on 03/07/2015 and underwent Procedure(s): COMPLETE DECOMPRESSION/LUMBAR LAMINECTOMY L4 - L5 FOR STENOSIS 1 LEVEL.  Patient tolerated the procedure well and was later transferred to the recovery room and then to the orthopaedic floor for postoperative care.  They were given PO and IV analgesics for pain control following their surgery.  They were given 24 hours of postoperative antibiotics of  Anti-infectives    Start     Dose/Rate Route Frequency Ordered Stop   03/07/15 1800  ceFAZolin (ANCEF) IVPB 1 g/50 mL premix  Status:  Discontinued     1 g 100 mL/hr over 30 Minutes Intravenous 3 times per day 03/07/15 1658 03/08/15 1408   03/07/15 1213  polymyxin B 500,000 Units, bacitracin 50,000 Units in sodium chloride irrigation 0.9 % 500 mL irrigation  Status:  Discontinued       As needed 03/07/15 1214 03/07/15 1352    03/07/15 0842  ceFAZolin (ANCEF) IVPB 2 g/50 mL premix     2 g 100 mL/hr over 30 Minutes Intravenous On call to O.R. 03/07/15 2751 03/07/15 1157     and started on DVT prophylaxis in the form of Aspirin.   PT was ordered.  Discharge planning consulted to help with postop disposition and equipment needs.  Patient had a fair night on the evening of surgery.  They started to get up OOB with therapy on day one.  Dressing was changed and the incision was clean and dry.  Incision was healing well.  Patient was seen in rounds and was ready to go home.   Diet: Cardiac diet Activity:WBAT Follow-up:in 2 weeks Disposition - Home Discharged Condition: stable   Discharge Instructions    Call MD / Call 911    Complete by:  As directed   If you experience chest pain or shortness of breath, CALL 911 and be transported to the hospital emergency room.  If you develope a fever above 101 F, pus (white drainage) or increased drainage or redness at the wound, or calf pain, call your surgeon's office.     Constipation Prevention    Complete by:  As directed   Drink plenty of fluids.  Prune juice may be helpful.  You may use a stool softener, such as Colace (over the counter) 100 mg twice a day.  Use MiraLax (over the counter) for constipation as needed.     Diet - low sodium heart healthy    Complete by:  As directed      Discharge instructions    Complete by:  As directed   No lifting or bending For the first few days, remove your dressing, tape a piece of saran wrap over your incision  Take your shower, then remove the saran wrap and put a clean dressing on. After three days you can shower without the saran wrap.  Take aspirin 359m daily to prevent blood clots. Call Dr.  Gioffre if any wound complications or temperature of 101 degrees F or over.  Call the office for an appointment to see Dr. Gladstone Lighter in two weeks: [phone] and ask for Dr. Charlestine Night nurse, Brunilda Payor.     Driving restrictions     Complete by:  As directed   No driving while taking pain medications     Increase activity slowly as tolerated    Complete by:  As directed             Medication List    STOP taking these medications        cyclobenzaprine 5 MG tablet  Commonly known as:  FLEXERIL      TAKE these medications        amLODipine 10 MG tablet  Commonly known as:  NORVASC  Take 10 mg by mouth every morning.     calcium citrate-vitamin D 315-200 MG-UNIT tablet  Commonly known as:  CITRACAL+D  Take 1 tablet by mouth every morning.     CYMBALTA 30 MG capsule  Generic drug:  DULoxetine  Take 60 mg by mouth daily.     esomeprazole 40 MG packet  Commonly known as:  NEXIUM  Take 40 mg by mouth daily before breakfast.     fluticasone 50 MCG/ACT nasal spray  Commonly known as:  FLONASE  Place 1 spray into the nose daily as needed. allergies     HYDROcodone-acetaminophen 5-325 MG tablet  Commonly known as:  NORCO/VICODIN  Take 1-2 tablets by mouth every 4 (four) hours as needed (mild pain).     methocarbamol 500 MG tablet  Commonly known as:  ROBAXIN  Take 1 tablet (500 mg total) by mouth every 6 (six) hours as needed for muscle spasms.     montelukast 10 MG tablet  Commonly known as:  SINGULAIR  Take 10 mg by mouth at bedtime.     multivitamin tablet  Take 1 tablet by mouth daily.     PROAIR HFA 108 (90 Base) MCG/ACT inhaler  Generic drug:  albuterol  Inhale 2 puffs into the lungs every 6 (six) hours as needed. Shortness of breath     VISINE EXTRA OP  Apply 1 drop to eye daily as needed (tired/dry eyes.).     VITAMIN D (CHOLECALCIFEROL) PO  Take 1 capsule by mouth daily.     VYTORIN 10-40 MG tablet  Generic drug:  ezetimibe-simvastatin  Take 1 tablet by mouth at bedtime.           Follow-up Information    Follow up with GIOFFRE,RONALD A, MD. Schedule an appointment as soon as possible for a visit in 2 weeks.   Specialty:  Orthopedic Surgery   Contact information:   [address] [place] [address] [phone]       Signed: Ardeen Jourdain, PA-C Orthopaedic Surgery 03/12/2015, 7:20 AM

## 2015-05-23 ENCOUNTER — Other Ambulatory Visit: Payer: Self-pay | Admitting: Radiology

## 2015-10-07 IMAGING — CT CT KNEE*R* W/O CM
3 of 4 series · 13 of 33 positions shown, 16 images · non-contrast
Comparison: 12/21/2013 radiographs.

CLINICAL DATA: Tibial plateau fracture. Initial encounter. RIGHT
knee swelling.

EXAM:
CT OF THE RIGHT knee KNEE WITHOUT CONTRAST
TECHNIQUE: Multidetector CT imaging of the RIGHT knee knee was performed
according to the standard protocol. Multiplanar CT image
reconstructions were also generated.

[Series 6: coronal bone · coronal · 0.35mm/px · 3 of 74 slices shown]
[im 15/74  bone]
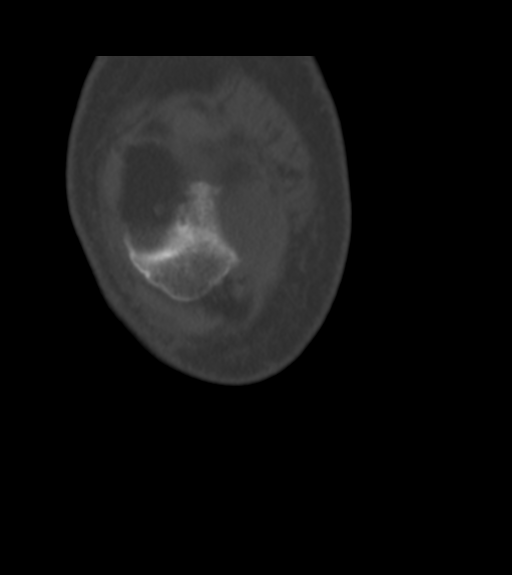
[im 30/74  bone]
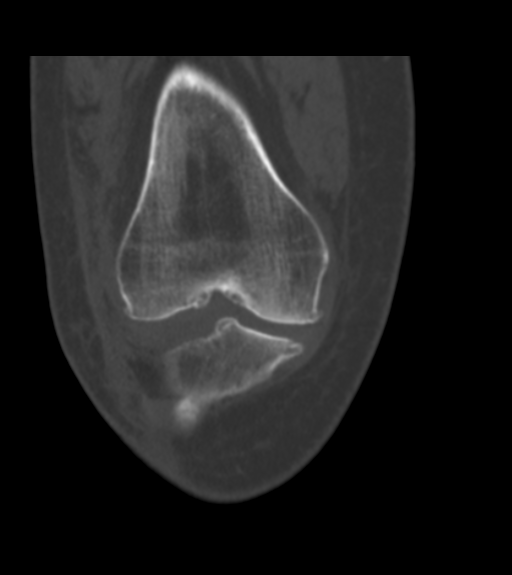
[im 44/74  bone]
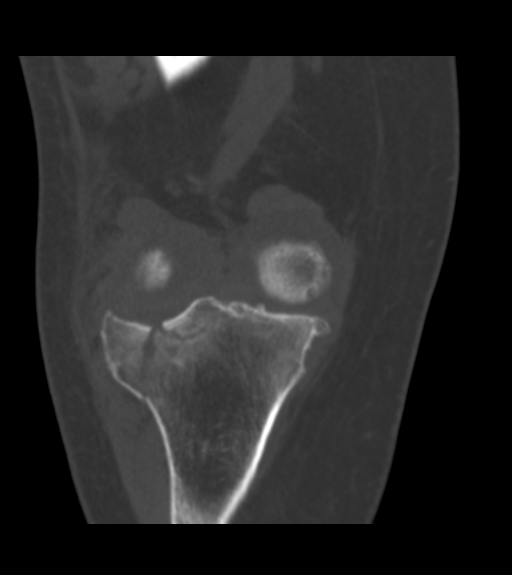

[Series 7: sagittal bone · sagittal · 0.32mm/px · 5 of 71 slices shown, 6 images]
[im 24/71  bone]
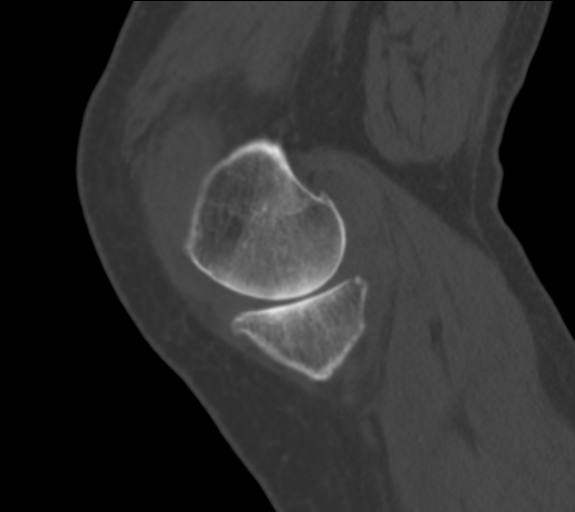
[im 30/71  bone]
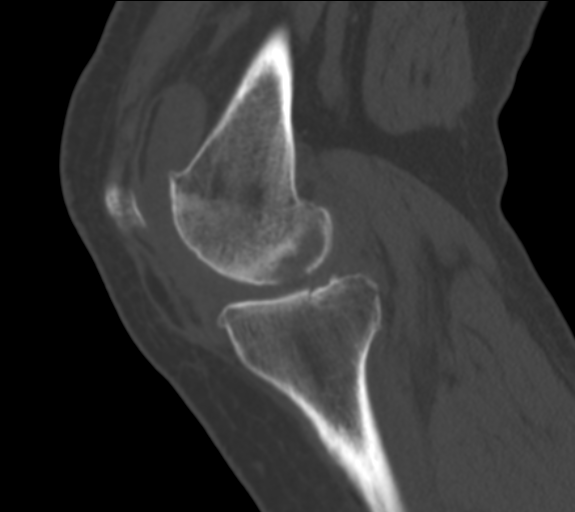
[im 36/71  soft-tissue]
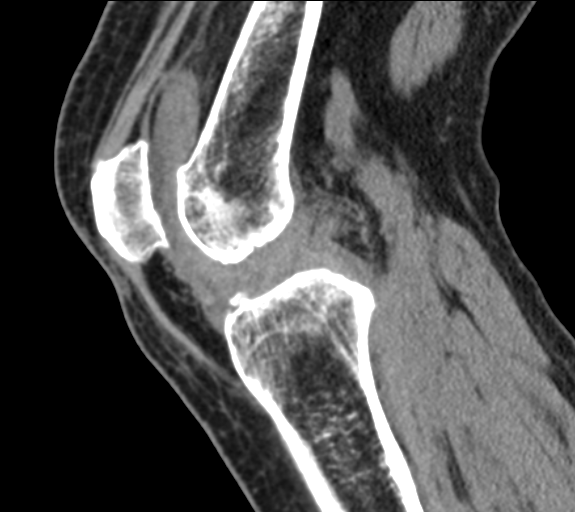
[im 36/71  bone]
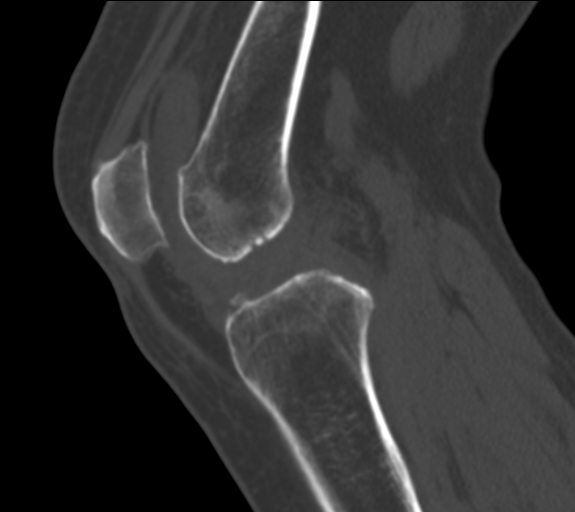
[im 41/71  bone]
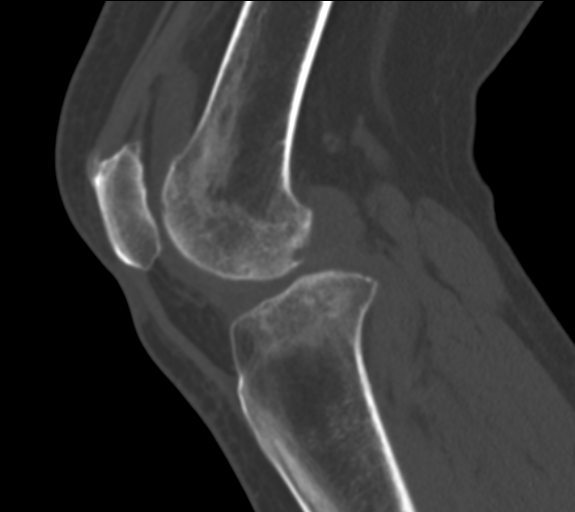
[im 47/71  bone]
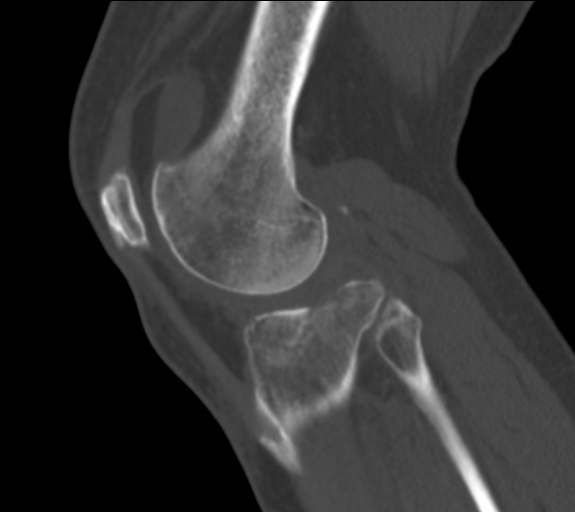

[Series 8: axial bone · axial · 0.29mm/px · z∈[+797,+917]mm · 5 of 80 slices shown, 7 images]
[im 9/80  soft-tissue]
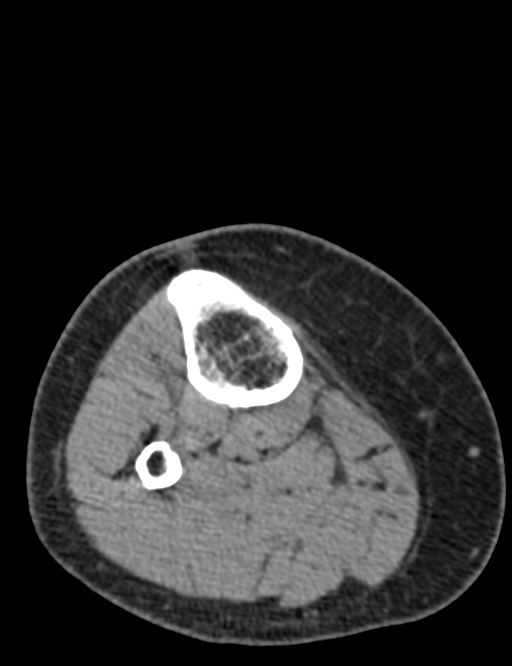
[im 9/80  bone]
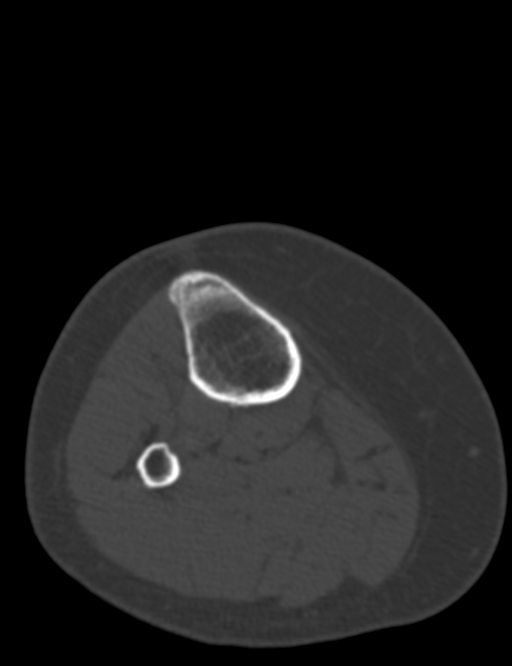
[im 27/80  bone]
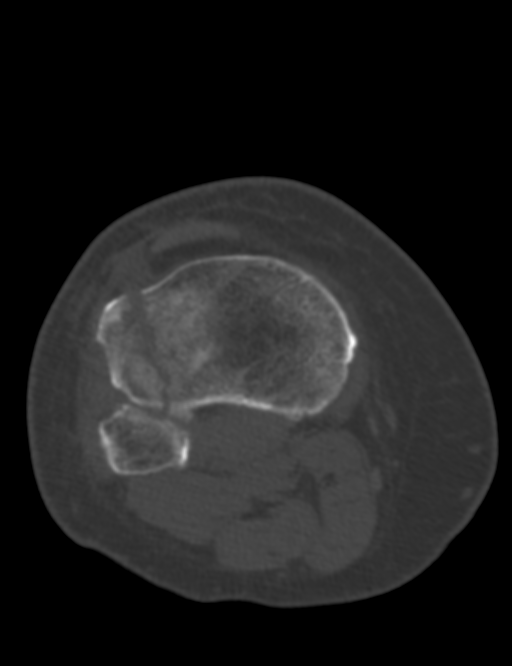
[im 44/80  bone]
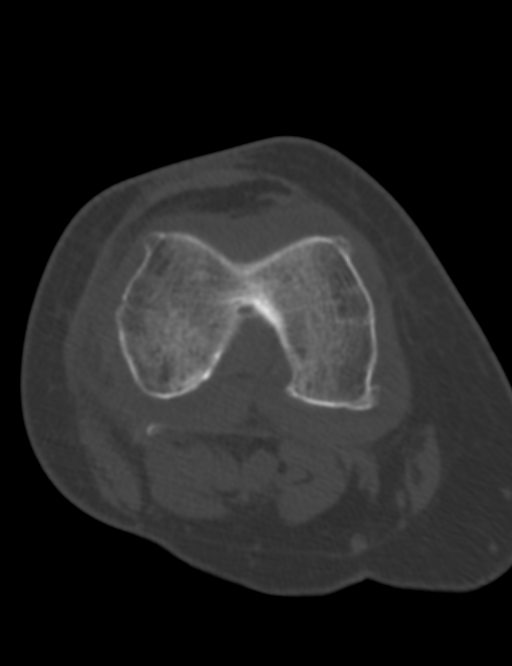
[im 53/80  bone]
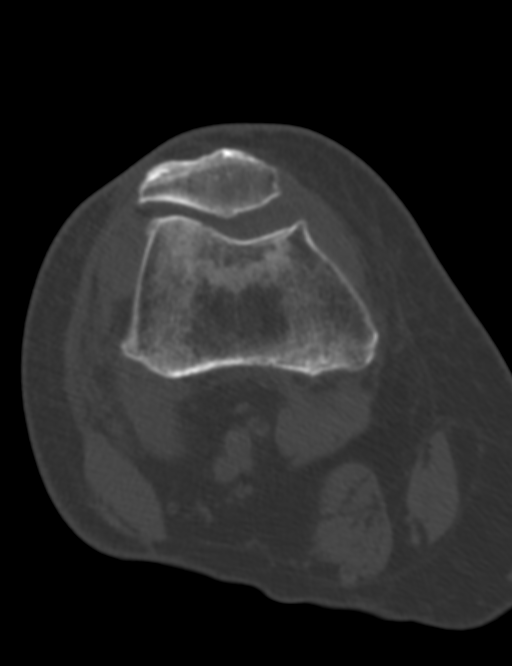
[im 71/80  soft-tissue]
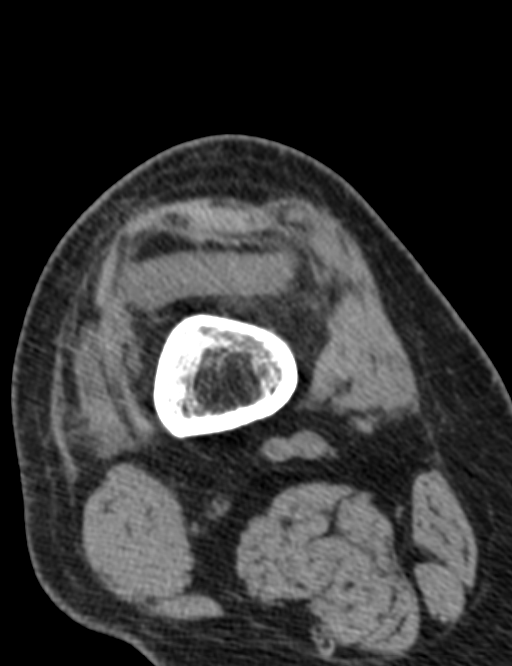
[im 71/80  bone]
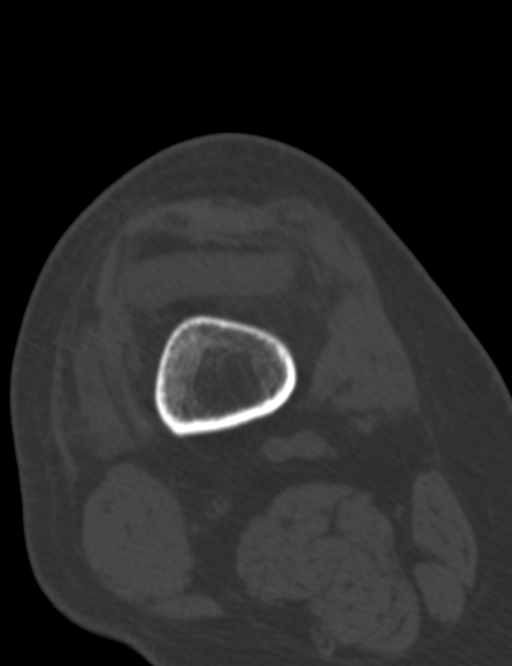

[13 of 33 positions shown; findings below may reference images not displayed]

FINDINGS: Lipohemarthrosis is present. There is an lateral tibial plateau
fracture with central depression measuring 8 mm. Fracture extends
into the proximal tibiofibular joint. The distal femur appears
intact. Moderate patellofemoral osteoarthritis. Severe medial
compartment osteoarthritis. Proximal fibula appears intact.
Metaphysis intact.

Medial tibial plateau fracture is also present, only well seen on
the sagittal images. The fracture of the medial tibial plateau is
nondepressed (image 30 series 7). There is no cortical step-off.
IMPRESSION: Bicondylar tibial plateau fracture with depression of the central
lateral tibial plateau and no displacement or depression of the
medial tibial plateau fracture. This is compatible with Schatzker V
fracture.

## 2015-10-12 IMAGING — RF DG KNEE 3 VIEWS*R*
1 series · 2 of 2 positions shown · non-contrast
Comparison: CT, 12/21/2013.

CLINICAL DATA: ORIF of a proximal right tibia fracture.

EXAM:
DG C-ARM 1-60 MIN - [facility]; RIGHT KNEE - 3 VIEW

[Series 1: run · 2 of 2 slices shown]
[im 1/2]
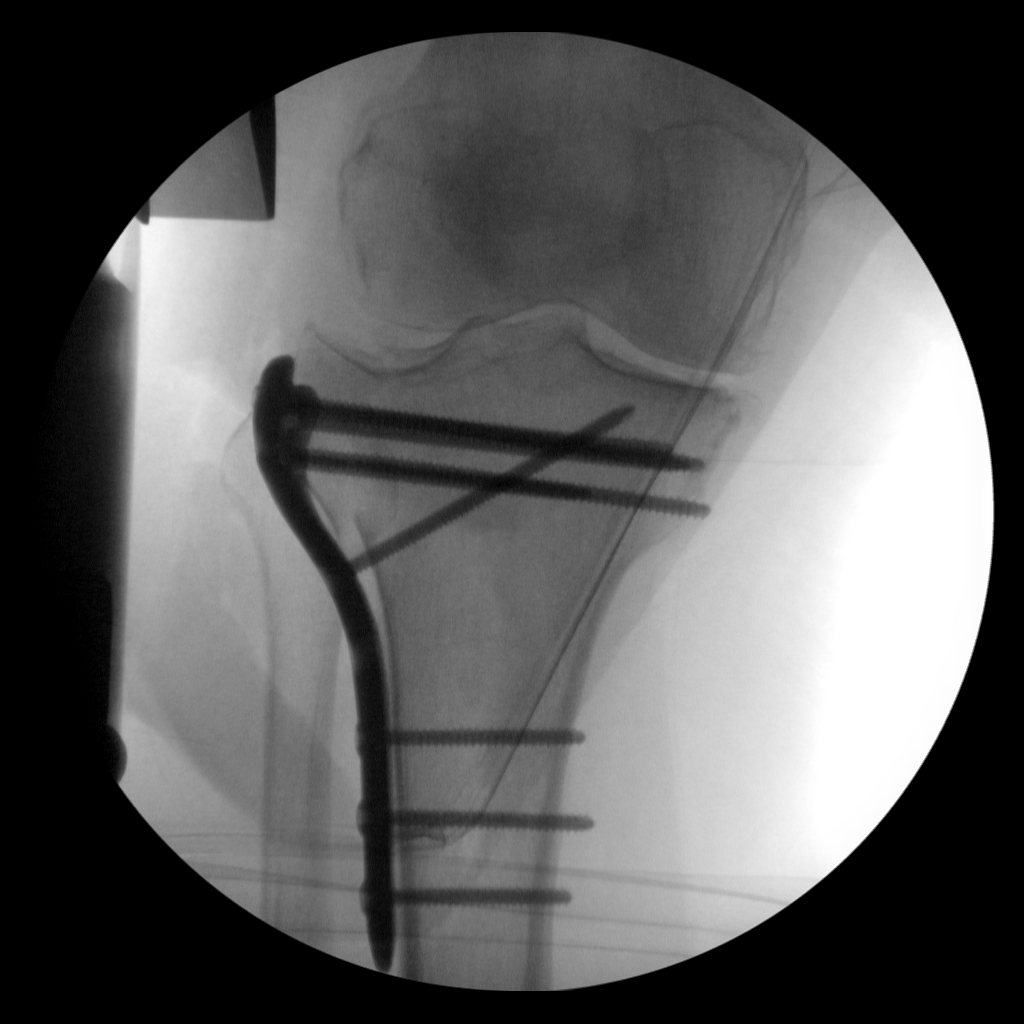
[im 2/2]
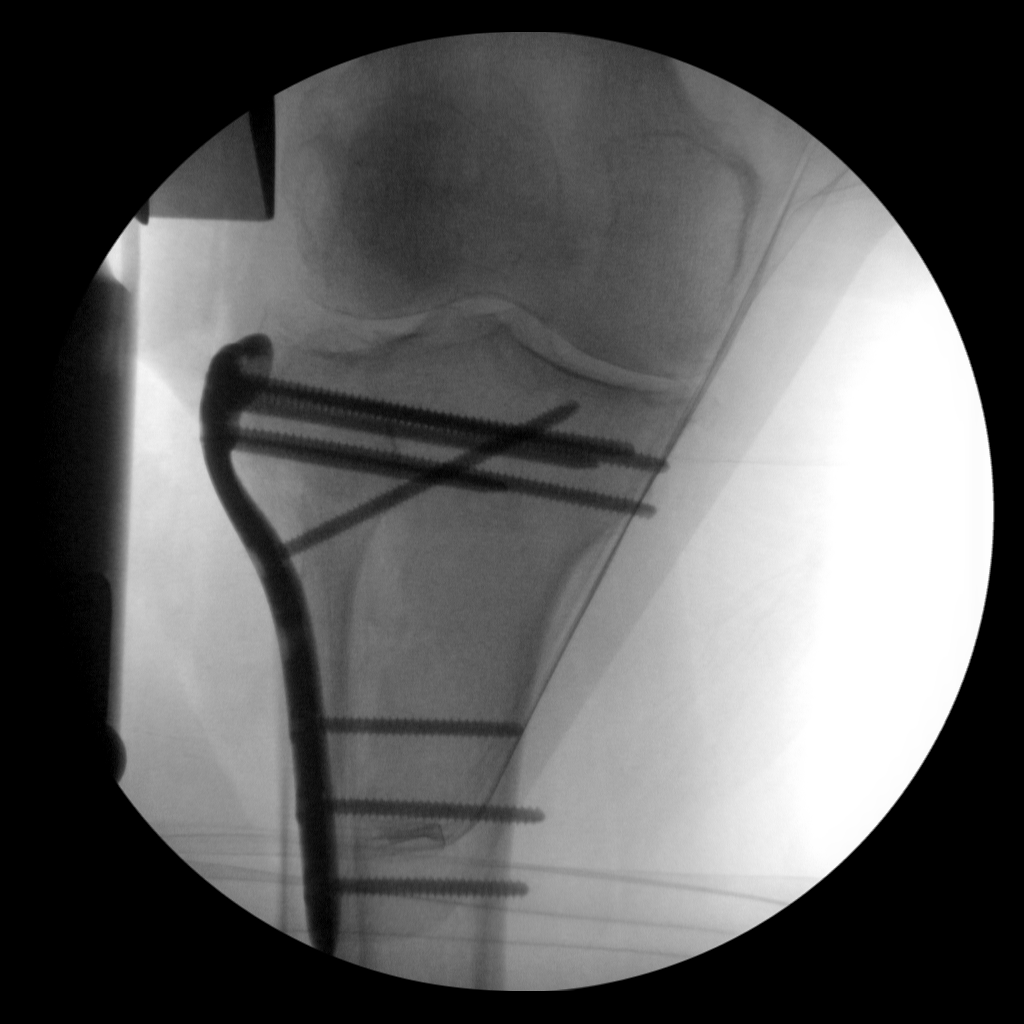

[2 of 2 positions shown; findings below may reference images not displayed]

FINDINGS: Spot fluoro graphic image shows a lateral fixation plate and
multiple screws reducing the lateral tibial plateau fracture.
Fracture fragments appear well aligned. There is no new fracture or
evidence of an operative complication.
IMPRESSION: Operative imaging during ORIF of a right lateral tibial plateau
fracture

## 2016-10-28 ENCOUNTER — Telehealth: Payer: Self-pay

## 2016-10-28 NOTE — Telephone Encounter (Signed)
SENT NOTE TO SCHEDULING 

## 2016-10-30 ENCOUNTER — Encounter: Payer: Self-pay | Admitting: Cardiovascular Disease

## 2016-10-30 ENCOUNTER — Ambulatory Visit (INDEPENDENT_AMBULATORY_CARE_PROVIDER_SITE_OTHER): Payer: Medicare Other | Admitting: Cardiovascular Disease

## 2016-10-30 ENCOUNTER — Encounter (INDEPENDENT_AMBULATORY_CARE_PROVIDER_SITE_OTHER): Payer: Self-pay

## 2016-10-30 ENCOUNTER — Encounter: Payer: Self-pay | Admitting: *Deleted

## 2016-10-30 VITALS — BP 114/78 | HR 89 | Ht 64.0 in | Wt 190.0 lb

## 2016-10-30 DIAGNOSIS — I200 Unstable angina: Secondary | ICD-10-CM | POA: Diagnosis not present

## 2016-10-30 NOTE — Patient Instructions (Addendum)
Medication Instructions:  Your physician recommends that you continue on your current medications as directed. Please refer to the Current Medication list given to you today.   Labwork:  Lab work to be done today--BMP, CBC, PT  Testing/Procedures: Your physician has requested that you have an echocardiogram. Echocardiography is a painless test that uses sound waves to create images of your heart. It provides your doctor with information about the size and shape of your heart and how well your heart's chambers and valves are working. This procedure takes approximately one hour. There are no restrictions for this procedure.    Your physician has requested that you have a cardiac catheterization. Cardiac catheterization is used to diagnose and/or treat various heart conditions. Doctors may recommend this procedure for a number of different reasons. The most common reason is to evaluate chest pain. Chest pain can be a symptom of coronary artery disease (CAD), and cardiac catheterization can show whether plaque is narrowing or blocking your heart's arteries. This procedure is also used to evaluate the valves, as well as measure the blood flow and oxygen levels in different parts of your heart. For further information please visit [email or web address]. Please follow instruction sheet, as given.  Scheduled for September 19,2018  Follow-Up: Your physician recommends that you schedule a follow-up appointment in: one month with PA or NP    Any Other Special Instructions Will Be Listed Below (If Applicable).     If you need a refill on your cardiac medications before your next appointment, please call your pharmacy.

## 2016-10-30 NOTE — Progress Notes (Signed)
Chief Complaint  Patient presents with  . New Patient (Initial Visit)    chest pain    History of Present Illness: 71 yo female with history of GERD, HTN, HLD who is here today as a new consult with c/o chest pain for the last several weeks. She is referred by Roe Coombs, PA-C. She has been seen remotely by Dr. Fransico Him and reports a normal stress test at that time in 2009. She has never smoked. She has no known CAD. She tells me today that she has had multiple episodes of chest pressure with associated dizziness. The pain radiates to her left arm and jaw. The pain radiates into her back. This occurs at rest and with exertion. There are rare palpitations. No dyspnea.   Primary Care Provider: Selinda Orion   Past Medical History:  Diagnosis Date  . Allergic rhinitis, cause unspecified   . Arthritis   . Cancer ([place])    hx skin cancer  . Depression   . Esophageal stenosis   . GERD (gastroesophageal reflux disease)   . Hiatal hernia   . History of bronchitis as a child   . History of nonmelanoma skin cancer   . History of urinary tract infection   . Hyperlipidemia   . Leg fracture, right    2015  . Multiple falls   . Pneumonia    2015  . Unspecified essential hypertension    PT STOPPED BP MED WITH PCP KNOWLEDGE  . Wears glasses     Past Surgical History:  Procedure Laterality Date  . APPENDECTOMY  1984  . BLADDER SUSPENSION  SEVERAL YRS AGO  . HAND SURGERY     trigger finger release bilat   . HARDWARE REMOVAL Right 04/19/2014   Procedure: HARDWARE REMOVAL FROM RIGHT TIBIA;  Surgeon: [name], MD;  Location: [facility];  Service: Orthopedics;  Laterality: Right;  . LAPAROSCOPIC CHOLECYSTECTOMY  04/2004  . laporoscopy  1977   ovarian cyst  . LATERAL COLLATERAL LIGAMENT REPAIR, KNEE     RT KNEE  . LUMBAR LAMINECTOMY/DECOMPRESSION MICRODISCECTOMY N/A 03/07/2015   Procedure: COMPLETE DECOMPRESSION/LUMBAR LAMINECTOMY L4 - L5 FOR STENOSIS 1 LEVEL;  Surgeon:  Latanya Maudlin, MD;  Location: [facility];  Service: Orthopedics;  Laterality: N/A;  . ORIF TIBIA PLATEAU Right 12/26/2013   Procedure: OPEN REDUCTION INTERNAL FIXATION (ORIF) TIBIAL PLATEAU;  Surgeon: [name], MD;  Location: [facility];  Service: Orthopedics;  Laterality: Right;  . TOTAL ANKLE REPLACEMENT     7 years ago; left   . TOTAL KNEE ARTHROPLASTY Right 08/09/2014   Procedure: RIGHT TOTAL KNEE ARTHROPLASTY;  Surgeon: Gaynelle Arabian, MD;  Location: [facility];  Service: Orthopedics;  Laterality: Right;  . TUBAL LIGATION     76 reversal in 81 and tubal in 85    Current Outpatient Prescriptions  Medication Sig Dispense Refill  . amLODipine (NORVASC) 10 MG tablet Take 10 mg by mouth every morning.    . calcium citrate-vitamin D (CITRACAL+D) 315-200 MG-UNIT per tablet Take 1 tablet by mouth every morning.    . DULoxetine (CYMBALTA) 30 MG capsule Take 60 mg by mouth daily.    Marland Kitchen esomeprazole (NEXIUM) 40 MG packet Take 40 mg by mouth daily before breakfast.    . fluticasone (FLONASE) 50 MCG/ACT nasal spray Place 1 spray into the nose daily as needed. allergies    . Multiple Vitamin (MULTIVITAMIN) tablet Take 1 tablet by mouth daily.    Marland Kitchen VITAMIN D, CHOLECALCIFEROL,  PO Take 1 capsule by mouth daily.    Marland Kitchen VYTORIN 10-40 MG per tablet Take 1 tablet by mouth at bedtime.  3   No current facility-administered medications for this visit.     No Known Allergies  Social History   Social History  . Marital status: Divorced    Spouse name: N/A  . Number of children: 1  . Years of education: N/A   Occupational History  . Retired     Event organiser   Social History Main Topics  . Smoking status: Never Smoker  . Smokeless tobacco: Never Used  . Alcohol use 1.8 oz/week    3 Cans of beer per week     Comment: OCCASIONAL  . Drug use: No  . Sexual activity: Yes    Birth control/ protection: None     Comment: not asked   Other Topics Concern  . Not on file   Social History Narrative  . No  narrative on file    Family History  Problem Relation Age of Onset  . Stomach cancer Brother 35       question if started pancreatic   . Diabetes Sister   . Heart disease Brother   . Colon cancer Neg Hx     Review of Systems:  As stated in the HPI and otherwise negative.   BP 114/78   Pulse 89   Ht 5\' 4"  (1.626 m)   Wt 190 lb (86.2 kg)   SpO2 97%   BMI 32.61 kg/m   Physical Examination: General: Well developed, well nourished, NAD  HEENT: OP clear, mucus membranes moist  SKIN: warm, dry. No rashes. Neuro: No focal deficits  Musculoskeletal: Muscle strength 5/5 all ext  Psychiatric: Mood and affect normal  Neck: No JVD, no carotid bruits, no thyromegaly, no lymphadenopathy.  Lungs:Clear bilaterally, no wheezes, rhonci, crackles Cardiovascular: Regular rate and rhythm. No murmurs, gallops or rubs. Abdomen:Soft. Bowel sounds present. Non-tender.  Extremities: No lower extremity edema. Pulses are 2 + in the bilateral DP/PT.  EKG:  EKG is not ordered today. The ekg from 10/28/16 is reviewed and shows NSR  Recent Labs: No results found for requested labs within last 8760 hours.   Lipid Panel    Wt Readings from Last 3 Encounters:  10/30/16 190 lb (86.2 kg)  03/07/15 191 lb (86.6 kg)  02/21/15 191 lb 6 oz (86.8 kg)     Other studies Reviewed: Additional studies/ records that were reviewed today include:  Review of the above records demonstrates:   Assessment and Plan:   1. Chest pain/Unstable angina: She has risk factors for CAD including advanced age, HTN, HLD and exposure to second hand smoke for years at work. Her brother had heart disease but he was a heavy smoker. Her chest pain has typical and atypical features. I have discussed cath vs stress testing. She wishes to proceed with cath. I will arrange a cardiac cath at [facility] on 11/12/16 at 8:30 am. Risks and benefits of procedure reviewed with pt. She agrees to proceed. Pre-cath labs today.   Current medicines are  reviewed at length with the patient today.  The patient does not have concerns regarding medicines.  The following changes have been made:  no change  Labs/ tests ordered today include:  No orders of the defined types were placed in this encounter.    Disposition:   FU with me in 4 weeks   Signed, Lauree Chandler, MD 10/30/2016 2:26 PM    [facility] [address] [place], [place], [place]  [address] Phone: [phone]; Fax: [phone]

## 2016-10-31 LAB — CBC WITH DIFFERENTIAL/PLATELET
BASOS: 0 %
Basophils Absolute: 0 10*3/uL (ref 0.0–0.2)
EOS (ABSOLUTE): 0.1 10*3/uL (ref 0.0–0.4)
EOS: 1 %
HEMOGLOBIN: 15.8 g/dL (ref 11.1–15.9)
Hematocrit: 45.5 % (ref 34.0–46.6)
IMMATURE GRANS (ABS): 0 10*3/uL (ref 0.0–0.1)
IMMATURE GRANULOCYTES: 0 %
LYMPHS: 35 %
Lymphocytes Absolute: 3.5 10*3/uL — ABNORMAL HIGH (ref 0.7–3.1)
MCH: 31.9 pg (ref 26.6–33.0)
MCHC: 34.7 g/dL (ref 31.5–35.7)
MCV: 92 fL (ref 79–97)
MONOCYTES: 9 %
Monocytes Absolute: 0.9 10*3/uL (ref 0.1–0.9)
NEUTROS PCT: 55 %
Neutrophils Absolute: 5.3 10*3/uL (ref 1.4–7.0)
PLATELETS: 293 10*3/uL (ref 150–379)
RBC: 4.95 x10E6/uL (ref 3.77–5.28)
RDW: 14 % (ref 12.3–15.4)
WBC: 9.9 10*3/uL (ref 3.4–10.8)

## 2016-10-31 LAB — BASIC METABOLIC PANEL
BUN/Creatinine Ratio: 16 (ref 12–28)
BUN: 14 mg/dL (ref 8–27)
CO2: 21 mmol/L (ref 20–29)
CREATININE: 0.9 mg/dL (ref 0.57–1.00)
Calcium: 9.9 mg/dL (ref 8.7–10.3)
Chloride: 102 mmol/L (ref 96–106)
GFR calc Af Amer: 75 mL/min/{1.73_m2} (ref >59)
GFR calc non Af Amer: 65 mL/min/{1.73_m2} (ref >59)
GLUCOSE: 92 mg/dL (ref 65–99)
POTASSIUM: 4.2 mmol/L (ref 3.5–5.2)
SODIUM: 140 mmol/L (ref 134–144)

## 2016-10-31 LAB — PROTIME-INR
INR: 1.1 (ref 0.8–1.2)
Prothrombin Time: 11 s (ref 9.1–12.0)

## 2016-11-06 ENCOUNTER — Ambulatory Visit (HOSPITAL_COMMUNITY): Payer: Medicare Other | Attending: Cardiovascular Disease

## 2016-11-06 ENCOUNTER — Other Ambulatory Visit: Payer: Self-pay

## 2016-11-06 DIAGNOSIS — I10 Essential (primary) hypertension: Secondary | ICD-10-CM | POA: Insufficient documentation

## 2016-11-06 DIAGNOSIS — R42 Dizziness and giddiness: Secondary | ICD-10-CM | POA: Diagnosis not present

## 2016-11-06 DIAGNOSIS — I200 Unstable angina: Secondary | ICD-10-CM | POA: Insufficient documentation

## 2016-11-06 DIAGNOSIS — E785 Hyperlipidemia, unspecified: Secondary | ICD-10-CM | POA: Diagnosis not present

## 2016-11-06 DIAGNOSIS — R079 Chest pain, unspecified: Secondary | ICD-10-CM | POA: Diagnosis not present

## 2016-11-06 LAB — ECHOCARDIOGRAM COMPLETE
CHL CUP MV DEC (S): 275
EERAT: 10.36
EWDT: 275 ms
FS: 48 % — AB (ref 28–44)
IVS/LV PW RATIO, ED: 1.16
LA ID, A-P, ES: 26 mm
LADIAMINDEX: 1.36 cm/m2
LAVOL: 30 mL
LAVOLA4C: 25 mL
LAVOLIN: 15.7 mL/m2
LEFT ATRIUM END SYS DIAM: 26 mm
LV E/e' medial: 10.36
LV E/e'average: 10.36
LV PW d: 11.7 mm — AB (ref 0.6–1.1)
LV TDI E'LATERAL: 6.91
LV e' LATERAL: 6.91 cm/s
LVOT area: 2.27 cm2
LVOTD: 17 mm
MV Peak grad: 2 mmHg
MV pk A vel: 121 m/s
MV pk E vel: 71.6 m/s
RV LATERAL S' VELOCITY: 11.6 cm/s
TDI e' medial: 6.25

## 2016-11-10 ENCOUNTER — Other Ambulatory Visit (HOSPITAL_BASED_OUTPATIENT_CLINIC_OR_DEPARTMENT_OTHER): Payer: Self-pay

## 2016-11-10 ENCOUNTER — Telehealth: Payer: Self-pay

## 2016-11-10 DIAGNOSIS — R079 Chest pain, unspecified: Secondary | ICD-10-CM

## 2016-11-10 NOTE — Telephone Encounter (Signed)
Left detailed message per DPR.  Patient contacted pre-catheterization at [facility] scheduled for:  11/12/2016 @ 0830 Verified arrival time and place:  [facility] @ 0630 Confirmed AM meds to be taken pre-cath with sip of water: Take ASA Patient must have responsible person to drive home post procedure and observe patient for 24 hours Addl concerns:  Left this nurse name and # for call back if any questions

## 2016-11-12 ENCOUNTER — Encounter (HOSPITAL_COMMUNITY): Payer: Self-pay | Admitting: Cardiovascular Disease

## 2016-11-12 ENCOUNTER — Encounter (HOSPITAL_COMMUNITY)
Admission: RE | Disposition: A | Discharge status: Home / Self Care | Payer: Self-pay | Source: Ambulatory Visit | Attending: Cardiovascular Disease

## 2016-11-12 ENCOUNTER — Other Ambulatory Visit: Payer: Self-pay

## 2016-11-12 ENCOUNTER — Ambulatory Visit (HOSPITAL_COMMUNITY)
Admission: RE | Admit: 2016-11-12 | Discharge: 2016-11-12 | Disposition: A | Discharge status: Home / Self Care | Payer: Medicare Other | Source: Ambulatory Visit | Attending: Cardiovascular Disease | Admitting: Cardiovascular Disease

## 2016-11-12 DIAGNOSIS — Z9049 Acquired absence of other specified parts of digestive tract: Secondary | ICD-10-CM | POA: Insufficient documentation

## 2016-11-12 DIAGNOSIS — K222 Esophageal obstruction: Secondary | ICD-10-CM | POA: Insufficient documentation

## 2016-11-12 DIAGNOSIS — Z800 Family history of malignant neoplasm of digestive organs: Secondary | ICD-10-CM | POA: Insufficient documentation

## 2016-11-12 DIAGNOSIS — K219 Gastro-esophageal reflux disease without esophagitis: Secondary | ICD-10-CM | POA: Insufficient documentation

## 2016-11-12 DIAGNOSIS — Z96662 Presence of left artificial ankle joint: Secondary | ICD-10-CM | POA: Insufficient documentation

## 2016-11-12 DIAGNOSIS — Z96651 Presence of right artificial knee joint: Secondary | ICD-10-CM | POA: Insufficient documentation

## 2016-11-12 DIAGNOSIS — F329 Major depressive disorder, single episode, unspecified: Secondary | ICD-10-CM | POA: Insufficient documentation

## 2016-11-12 DIAGNOSIS — Z9181 History of falling: Secondary | ICD-10-CM | POA: Insufficient documentation

## 2016-11-12 DIAGNOSIS — K449 Diaphragmatic hernia without obstruction or gangrene: Secondary | ICD-10-CM | POA: Diagnosis not present

## 2016-11-12 DIAGNOSIS — Z8249 Family history of ischemic heart disease and other diseases of the circulatory system: Secondary | ICD-10-CM | POA: Insufficient documentation

## 2016-11-12 DIAGNOSIS — N83209 Unspecified ovarian cyst, unspecified side: Secondary | ICD-10-CM | POA: Insufficient documentation

## 2016-11-12 DIAGNOSIS — M1990 Unspecified osteoarthritis, unspecified site: Secondary | ICD-10-CM | POA: Diagnosis not present

## 2016-11-12 DIAGNOSIS — Z85828 Personal history of other malignant neoplasm of skin: Secondary | ICD-10-CM | POA: Insufficient documentation

## 2016-11-12 DIAGNOSIS — I10 Essential (primary) hypertension: Secondary | ICD-10-CM | POA: Insufficient documentation

## 2016-11-12 DIAGNOSIS — E785 Hyperlipidemia, unspecified: Secondary | ICD-10-CM | POA: Diagnosis not present

## 2016-11-12 DIAGNOSIS — R072 Precordial pain: Secondary | ICD-10-CM

## 2016-11-12 DIAGNOSIS — Z79899 Other long term (current) drug therapy: Secondary | ICD-10-CM | POA: Insufficient documentation

## 2016-11-12 DIAGNOSIS — Z833 Family history of diabetes mellitus: Secondary | ICD-10-CM | POA: Diagnosis not present

## 2016-11-12 DIAGNOSIS — I200 Unstable angina: Secondary | ICD-10-CM

## 2016-11-12 HISTORY — PX: LEFT HEART CATH AND CORONARY ANGIOGRAPHY: CATH118249

## 2016-11-12 SURGERY — LEFT HEART CATH AND CORONARY ANGIOGRAPHY
Anesthesia: LOCAL

## 2016-11-12 MED ORDER — SODIUM CHLORIDE 0.9% FLUSH: 3.0000 mL | INTRAVENOUS | Status: DC | PRN

## 2016-11-12 MED ORDER — VERAPAMIL HCL 2.5 MG/ML IV SOLN
INTRAVENOUS | Status: AC
  Filled 2016-11-12: qty 2

## 2016-11-12 MED ORDER — SODIUM CHLORIDE 0.9% FLUSH: 3.0000 mL | Freq: Two times a day (BID) | INTRAVENOUS | Status: DC

## 2016-11-12 MED ORDER — HEPARIN (PORCINE) IN NACL 2-0.9 UNIT/ML-% IJ SOLN
INTRAMUSCULAR | Status: AC
  Filled 2016-11-12: qty 1000

## 2016-11-12 MED ORDER — ASPIRIN 81 MG PO CHEW: 81.0000 mg | CHEWABLE_TABLET | ORAL | Status: DC

## 2016-11-12 MED ORDER — IOPAMIDOL (ISOVUE-370) INJECTION 76%
INTRAVENOUS | Status: DC | PRN
  Administered 2016-11-12: 40 mL via INTRA_ARTERIAL

## 2016-11-12 MED ORDER — VERAPAMIL HCL 2.5 MG/ML IV SOLN
INTRAVENOUS | Status: DC | PRN
  Administered 2016-11-12: 10 mL via INTRA_ARTERIAL

## 2016-11-12 MED ORDER — HEPARIN SODIUM (PORCINE) 1000 UNIT/ML IJ SOLN
INTRAMUSCULAR | Status: DC | PRN
  Administered 2016-11-12: 4000 [IU] via INTRAVENOUS

## 2016-11-12 MED ORDER — MIDAZOLAM HCL 2 MG/2ML IJ SOLN
INTRAMUSCULAR | Status: DC | PRN
  Administered 2016-11-12 (×2): 1 mg via INTRAVENOUS

## 2016-11-12 MED ORDER — MIDAZOLAM HCL 2 MG/2ML IJ SOLN
INTRAMUSCULAR | Status: AC
  Filled 2016-11-12: qty 2

## 2016-11-12 MED ORDER — SODIUM CHLORIDE 0.9 % IV SOLN
250.0000 mL | INTRAVENOUS | Status: DC | PRN
Start: 2016-11-12 — End: 2016-11-12

## 2016-11-12 MED ORDER — SODIUM CHLORIDE 0.9 % IV SOLN
INTRAVENOUS | Status: AC
  Administered 2016-11-12: 07:00:00 via INTRAVENOUS

## 2016-11-12 MED ORDER — SODIUM CHLORIDE 0.9 % IV SOLN: 250.0000 mL | INTRAVENOUS | Status: DC | PRN

## 2016-11-12 MED ORDER — LIDOCAINE HCL (PF) 1 % IJ SOLN
INTRAMUSCULAR | Status: DC | PRN
  Administered 2016-11-12: 2 mL

## 2016-11-12 MED ORDER — IOPAMIDOL (ISOVUE-370) INJECTION 76%
INTRAVENOUS | Status: AC
  Filled 2016-11-12: qty 100

## 2016-11-12 MED ORDER — FENTANYL CITRATE (PF) 100 MCG/2ML IJ SOLN
INTRAMUSCULAR | Status: DC | PRN
  Administered 2016-11-12: 25 ug via INTRAVENOUS
  Administered 2016-11-12: 50 ug via INTRAVENOUS

## 2016-11-12 MED ORDER — HEPARIN SODIUM (PORCINE) 1000 UNIT/ML IJ SOLN
INTRAMUSCULAR | Status: AC
  Filled 2016-11-12: qty 1

## 2016-11-12 MED ORDER — LIDOCAINE HCL 2 % IJ SOLN
INTRAMUSCULAR | Status: AC
  Filled 2016-11-12: qty 10

## 2016-11-12 MED ORDER — SODIUM CHLORIDE 0.9 % IV SOLN: INTRAVENOUS | Status: AC

## 2016-11-12 MED ORDER — HEPARIN (PORCINE) IN NACL 2-0.9 UNIT/ML-% IJ SOLN
INTRAMUSCULAR | Status: AC | PRN
  Administered 2016-11-12: 1000 mL

## 2016-11-12 MED ORDER — FENTANYL CITRATE (PF) 100 MCG/2ML IJ SOLN
INTRAMUSCULAR | Status: AC
  Filled 2016-11-12: qty 2

## 2016-11-12 SURGICAL SUPPLY — 9 items

## 2016-11-12 NOTE — H&P (View-Only) (Signed)
Chief Complaint  Patient presents with  . New Patient (Initial Visit)    chest pain    History of Present Illness: 71 yo female with history of GERD, HTN, HLD who is here today as a new consult with c/o chest pain for the last several weeks. She is referred by Roe Coombs, PA-C. She has been seen remotely by Dr. Fransico Him and reports a normal stress test at that time in 2009. She has never smoked. She has no known CAD. She tells me today that she has had multiple episodes of chest pressure with associated dizziness. The pain radiates to her left arm and jaw. The pain radiates into her back. This occurs at rest and with exertion. There are rare palpitations. No dyspnea.   Primary Care Provider: Selinda Orion   Past Medical History:  Diagnosis Date  . Allergic rhinitis, cause unspecified   . Arthritis   . Cancer ([place])    hx skin cancer  . Depression   . Esophageal stenosis   . GERD (gastroesophageal reflux disease)   . Hiatal hernia   . History of bronchitis as a child   . History of nonmelanoma skin cancer   . History of urinary tract infection   . Hyperlipidemia   . Leg fracture, right    2015  . Multiple falls   . Pneumonia    2015  . Unspecified essential hypertension    PT STOPPED BP MED WITH PCP KNOWLEDGE  . Wears glasses     Past Surgical History:  Procedure Laterality Date  . APPENDECTOMY  1984  . BLADDER SUSPENSION  SEVERAL YRS AGO  . HAND SURGERY     trigger finger release bilat   . HARDWARE REMOVAL Right 04/19/2014   Procedure: HARDWARE REMOVAL FROM RIGHT TIBIA;  Surgeon: [name], MD;  Location: [facility];  Service: Orthopedics;  Laterality: Right;  . LAPAROSCOPIC CHOLECYSTECTOMY  04/2004  . laporoscopy  1977   ovarian cyst  . LATERAL COLLATERAL LIGAMENT REPAIR, KNEE     RT KNEE  . LUMBAR LAMINECTOMY/DECOMPRESSION MICRODISCECTOMY N/A 03/07/2015   Procedure: COMPLETE DECOMPRESSION/LUMBAR LAMINECTOMY L4 - L5 FOR STENOSIS 1 LEVEL;  Surgeon:  Latanya Maudlin, MD;  Location: [facility];  Service: Orthopedics;  Laterality: N/A;  . ORIF TIBIA PLATEAU Right 12/26/2013   Procedure: OPEN REDUCTION INTERNAL FIXATION (ORIF) TIBIAL PLATEAU;  Surgeon: [name], MD;  Location: [facility];  Service: Orthopedics;  Laterality: Right;  . TOTAL ANKLE REPLACEMENT     7 years ago; left   . TOTAL KNEE ARTHROPLASTY Right 08/09/2014   Procedure: RIGHT TOTAL KNEE ARTHROPLASTY;  Surgeon: Gaynelle Arabian, MD;  Location: [facility];  Service: Orthopedics;  Laterality: Right;  . TUBAL LIGATION     75 reversal in 81 and tubal in 85    Current Outpatient Prescriptions  Medication Sig Dispense Refill  . amLODipine (NORVASC) 10 MG tablet Take 10 mg by mouth every morning.    . calcium citrate-vitamin D (CITRACAL+D) 315-200 MG-UNIT per tablet Take 1 tablet by mouth every morning.    . DULoxetine (CYMBALTA) 30 MG capsule Take 60 mg by mouth daily.    Marland Kitchen esomeprazole (NEXIUM) 40 MG packet Take 40 mg by mouth daily before breakfast.    . fluticasone (FLONASE) 50 MCG/ACT nasal spray Place 1 spray into the nose daily as needed. allergies    . Multiple Vitamin (MULTIVITAMIN) tablet Take 1 tablet by mouth daily.    Marland Kitchen VITAMIN D, CHOLECALCIFEROL,  PO Take 1 capsule by mouth daily.    Marland Kitchen VYTORIN 10-40 MG per tablet Take 1 tablet by mouth at bedtime.  3   No current facility-administered medications for this visit.     No Known Allergies  Social History   Social History  . Marital status: Divorced    Spouse name: N/A  . Number of children: 1  . Years of education: N/A   Occupational History  . Retired     Event organiser   Social History Main Topics  . Smoking status: Never Smoker  . Smokeless tobacco: Never Used  . Alcohol use 1.8 oz/week    3 Cans of beer per week     Comment: OCCASIONAL  . Drug use: No  . Sexual activity: Yes    Birth control/ protection: None     Comment: not asked   Other Topics Concern  . Not on file   Social History Narrative  . No  narrative on file    Family History  Problem Relation Age of Onset  . Stomach cancer Brother 67       question if started pancreatic   . Diabetes Sister   . Heart disease Brother   . Colon cancer Neg Hx     Review of Systems:  As stated in the HPI and otherwise negative.   BP 114/78   Pulse 89   Ht 5\' 4"  (1.626 m)   Wt 190 lb (86.2 kg)   SpO2 97%   BMI 32.61 kg/m   Physical Examination: General: Well developed, well nourished, NAD  HEENT: OP clear, mucus membranes moist  SKIN: warm, dry. No rashes. Neuro: No focal deficits  Musculoskeletal: Muscle strength 5/5 all ext  Psychiatric: Mood and affect normal  Neck: No JVD, no carotid bruits, no thyromegaly, no lymphadenopathy.  Lungs:Clear bilaterally, no wheezes, rhonci, crackles Cardiovascular: Regular rate and rhythm. No murmurs, gallops or rubs. Abdomen:Soft. Bowel sounds present. Non-tender.  Extremities: No lower extremity edema. Pulses are 2 + in the bilateral DP/PT.  EKG:  EKG is not ordered today. The ekg from 10/28/16 is reviewed and shows NSR  Recent Labs: No results found for requested labs within last 8760 hours.   Lipid Panel    Wt Readings from Last 3 Encounters:  10/30/16 190 lb (86.2 kg)  03/07/15 191 lb (86.6 kg)  02/21/15 191 lb 6 oz (86.8 kg)     Other studies Reviewed: Additional studies/ records that were reviewed today include:  Review of the above records demonstrates:   Assessment and Plan:   1. Chest pain/Unstable angina: She has risk factors for CAD including advanced age, HTN, HLD and exposure to second hand smoke for years at work. Her brother had heart disease but he was a heavy smoker. Her chest pain has typical and atypical features. I have discussed cath vs stress testing. She wishes to proceed with cath. I will arrange a cardiac [facility] on 11/12/16 at 8:30 am. Risks and benefits of procedure reviewed with pt. She agrees to proceed. Pre-cath labs today.   Current medicines are  reviewed at length with the patient today.  The patient does not have concerns regarding medicines.  The following changes have been made:  no change  Labs/ tests ordered today include:  No orders of the defined types were placed in this encounter.    Disposition:   FU with me in 4 weeks   Signed, Lauree Chandler, MD 10/30/2016 2:26 PM    [facility] [place], [place], [place]  [address] Phone: [phone]; Fax: [phone]

## 2016-11-12 NOTE — Discharge Instructions (Signed)

## 2016-11-12 NOTE — Interval H&P Note (Signed)
History and Physical Interval Note:  11/12/2016 7:18 AM  [name]  has presented today for cardiac cath with the diagnosis of unstable angina. The various methods of treatment have been discussed with the patient and family. After consideration of risks, benefits and other options for treatment, the patient has consented to  Procedure(s): LEFT HEART CATH AND CORONARY ANGIOGRAPHY (N/A) as a surgical intervention .  The patient's history has been reviewed, patient examined, no change in status, stable for surgery.  I have reviewed the patient's chart and labs.  Questions were answered to the patient's satisfaction.    Cath Lab Visit (complete for each Cath Lab visit)  Clinical Evaluation Leading to the Procedure:   ACS: No.  Non-ACS:    Anginal Classification: CCS II  Anti-ischemic medical therapy: Minimal Therapy (1 class of medications)  Non-Invasive Test Results: No non-invasive testing performed  Prior CABG: No previous CABG         Lauree Chandler

## 2016-12-01 ENCOUNTER — Ambulatory Visit (INDEPENDENT_AMBULATORY_CARE_PROVIDER_SITE_OTHER): Payer: Medicare Other | Admitting: Physician Assistant

## 2016-12-01 ENCOUNTER — Encounter: Payer: Self-pay | Admitting: Physician Assistant

## 2016-12-01 VITALS — BP 114/76 | HR 83 | Ht 64.0 in | Wt 190.4 lb

## 2016-12-01 DIAGNOSIS — I10 Essential (primary) hypertension: Secondary | ICD-10-CM

## 2016-12-01 DIAGNOSIS — R072 Precordial pain: Secondary | ICD-10-CM

## 2016-12-01 DIAGNOSIS — E7800 Pure hypercholesterolemia, unspecified: Secondary | ICD-10-CM

## 2016-12-01 NOTE — Patient Instructions (Signed)
Your physician recommends that you continue on your current medications as directed. Please refer to the Current Medication list given to you today. Your physician wants you to follow-up in: 4-5 months with Dr. Angelena Form. You will receive a reminder letter in the mail two months in advance. If you don't receive a letter, please call our office to schedule the follow-up appointment.

## 2016-12-01 NOTE — Progress Notes (Signed)
Cardiology Office Note    Date:  12/01/2016   ID:  [name], [name] [birth year], MRN [id number]  PCP:  Aura Dials, PA-C  Cardiologist: Dr. Angelena Form  Chief Complaint  Patient presents with  . Follow-up    History of Present Illness:  [name] is a 71 y.o. female with history of hypertension, HLD, GERD, remote normal stress test in 2009 by Dr. Radford Pax. Saw Dr. Angelena Form 10/30/16 with recurrent chest pain with typical and atypical features. He discussed cath versus stress testing and she preferred to proceed with cardiac catheterization. This was performed on 11/12/16 and showed normal coronary arteries and normal LV filling pressures. 2-D echo showed questionable small LVOT gradient although this exam is not appreciated due to poor image quality. Normal LVEF 60-65% with dynamic obstruction at resting the outflow tract with mid cavity obliteration. She had grade 1 DD. Optimal blood pressure control recommended.  Patient comes in today for follow-up. She is feeling much better. She thinks her bra makes her chest hurt because gets too tight. She also had a shot in her shoulder for bursitis in her arm is feeling better. Blood pressure is well-controlled.    Past Medical History:  Diagnosis Date  . Allergic rhinitis, cause unspecified   . Arthritis   . Cancer ([place])    hx skin cancer  . Depression   . Esophageal stenosis   . GERD (gastroesophageal reflux disease)   . Hiatal hernia   . History of bronchitis as a child   . History of nonmelanoma skin cancer   . History of urinary tract infection   . Hyperlipidemia   . Leg fracture, right    2015  . Multiple falls   . Pneumonia    2015  . Unspecified essential hypertension    PT STOPPED BP MED WITH PCP KNOWLEDGE  . Wears glasses     Past Surgical History:  Procedure Laterality Date  . APPENDECTOMY  1984  . BLADDER SUSPENSION  SEVERAL YRS AGO  . HAND SURGERY     trigger finger release bilat   . HARDWARE REMOVAL  Right 04/19/2014   Procedure: HARDWARE REMOVAL FROM RIGHT TIBIA;  Surgeon: [name], MD;  Location: [facility];  Service: Orthopedics;  Laterality: Right;  . LAPAROSCOPIC CHOLECYSTECTOMY  04/2004  . laporoscopy  1977   ovarian cyst  . LATERAL COLLATERAL LIGAMENT REPAIR, KNEE     RT KNEE  . LEFT HEART CATH AND CORONARY ANGIOGRAPHY N/A 11/12/2016   Procedure: LEFT HEART CATH AND CORONARY ANGIOGRAPHY;  Surgeon: Burnell Blanks, MD;  Location: [facility];  Service: Cardiovascular;  Laterality: N/A;  . LUMBAR LAMINECTOMY/DECOMPRESSION MICRODISCECTOMY N/A 03/07/2015   Procedure: COMPLETE DECOMPRESSION/LUMBAR LAMINECTOMY L4 - L5 FOR STENOSIS 1 LEVEL;  Surgeon: Latanya Maudlin, MD;  Location: [facility];  Service: Orthopedics;  Laterality: N/A;  . ORIF TIBIA PLATEAU Right 12/26/2013   Procedure: OPEN REDUCTION INTERNAL FIXATION (ORIF) TIBIAL PLATEAU;  Surgeon: [name], MD;  Location: [facility];  Service: Orthopedics;  Laterality: Right;  . TOTAL ANKLE REPLACEMENT     7 years ago; left   . TOTAL KNEE ARTHROPLASTY Right 08/09/2014   Procedure: RIGHT TOTAL KNEE ARTHROPLASTY;  Surgeon: Gaynelle Arabian, MD;  Location: [facility];  Service: Orthopedics;  Laterality: Right;  . TUBAL LIGATION     75 reversal in 81 and tubal in 85    Current Medications: Current Meds  Medication Sig  . amLODipine (NORVASC) 10 MG tablet Take  10 mg by mouth every morning.  Marland Kitchen aspirin EC 81 MG tablet Take 81 mg by mouth daily.  . calcium citrate-vitamin D (CITRACAL+D) 315-200 MG-UNIT per tablet Take 1 tablet by mouth every morning.  . Cholecalciferol (VITAMIN D3) 400 units CAPS Take 400 Units by mouth daily.  . DULoxetine (CYMBALTA) 30 MG capsule Take 60 mg by mouth daily.  . fluticasone (FLONASE) 50 MCG/ACT nasal spray Place 1 spray into the nose daily as needed for allergies.   Marland Kitchen ketotifen (ZADITOR) 0.025 % ophthalmic solution Place 1 drop into both eyes 2 (two) times daily as needed (for seasonal allergy eyes.).    Marland Kitchen Multiple Vitamin (MULTIVITAMIN WITH MINERALS) TABS tablet Take 1 tablet by mouth daily.  Marland Kitchen omeprazole (PRILOSEC) 40 MG capsule Take 40 mg by mouth daily as needed (for acid reflux.).  Marland Kitchen polyvinyl alcohol (LUBRICANT DROPS) 1.4 % ophthalmic solution Place 1-2 drops into both eyes 3 (three) times daily as needed for dry eyes.  . vitamin E 400 UNIT capsule Take 400 Units by mouth daily.  Marland Kitchen VYTORIN 10-40 MG per tablet Take 1 tablet by mouth at bedtime.     Allergies:   Patient has no known allergies.   Social History   Social History  . Marital status: Divorced    Spouse name: N/A  . Number of children: 1  . Years of education: N/A   Occupational History  . Retired     Event organiser   Social History Main Topics  . Smoking status: Never Smoker  . Smokeless tobacco: Never Used  . Alcohol use 1.8 oz/week    3 Cans of beer per week     Comment: OCCASIONAL  . Drug use: No  . Sexual activity: Yes    Birth control/ protection: None     Comment: not asked   Other Topics Concern  . None   Social History Narrative  . None     Family History:  The patient's family history includes Diabetes in her sister; Heart disease in her brother; Stomach cancer (age of onset: 83) in her brother.   ROS:   Please see the history of present illness.    Review of Systems  Constitution: Positive for malaise/fatigue.  HENT: Negative.   Eyes: Negative.   Cardiovascular: Negative.   Respiratory: Positive for wheezing.   Hematologic/Lymphatic: Negative.   Musculoskeletal: Negative.  Negative for joint pain.  Gastrointestinal: Negative.   Genitourinary: Negative.   Neurological: Negative.    All other systems reviewed and are negative.   PHYSICAL EXAM:   VS:  BP 114/76   Pulse 83   Ht 5\' 4"  (1.626 m)   Wt 190 lb 6.4 oz (86.4 kg)   SpO2 99%   BMI 32.68 kg/m   Physical Exam  GEN: Well nourished, well developed, in no acute distress  Neck: no JVD, carotid bruits, or  masses Cardiac:RRR; no murmurs, rubs, or gallops  Respiratory:  clear to auscultation bilaterally, normal work of breathing GI: soft, nontender, nondistended, + BS Ext: Right arm at cath site without hematoma or hemorrhage, good radial and brachial pulses, lower extremities without cyanosis, clubbing, or edema, Good distal pulses bilaterally Neuro:  Alert and Oriented x 3intact Psych: euthymic mood, full affect  Wt Readings from Last 3 Encounters:  12/01/16 190 lb 6.4 oz (86.4 kg)  11/12/16 187 lb (84.8 kg)  10/30/16 190 lb (86.2 kg)      Studies/Labs Reviewed:   EKG:  EKG is not ordered today.  Recent Labs: 10/30/2016: BUN 14; Creatinine, Ser 0.90; Hemoglobin 15.8; Platelets 293; Potassium 4.2; Sodium 140   Lipid Panel    Component Value Date/Time   CHOL  05/15/2007 0350    171        ATP III CLASSIFICATION:  <200     mg/dL   Desirable  200-239  mg/dL   Borderline High  >=240    mg/dL   High   TRIG 111 05/15/2007 0350   HDL 43 05/15/2007 0350   CHOLHDL 4.0 05/15/2007 0350   VLDL 22 05/15/2007 0350   LDLCALC (H) 05/15/2007 0350    106        Total Cholesterol/HDL:CHD Risk Coronary Heart Disease Risk Table                     Men   Women  1/2 Average Risk   3.4   3.3    Additional studies/ records that were reviewed today include:  Cardiac catheterization 11/12/16  1. No angiographic evidence of CAD 2. Normal LV filling pressures   Recommendations: No further ischemic workup. Continue optimal BP control.   2-D echo 11/06/16 Study Conclusions   - Left ventricle: There appears to be a small LVOT gradient   although SAM is not appreciated due to poor image quality. The   cavity size was normal. Wall thickness was increased in a pattern   of moderate LVH. Systolic function was normal. The estimated   ejection fraction was in the range of 60% to 65%. There was   dynamic obstruction at restin the outflow tract, with mid-cavity   obliteration, a peak velocity of 288  cm/sec, and a peak gradient   of 33 mm Hg. Wall motion was normal; there were no regional wall   motion abnormalities. Doppler parameters are consistent with   abnormal left ventricular relaxation (grade 1 diastolic   dysfunction). - Right ventricle: The cavity size was mildly dilated. Wall   thickness was normal. - Atrial septum: No defect or patent foramen ovale was identified.       ASSESSMENT:    1. Precordial pain   2. Essential hypertension   3. HYPERCHOLESTEROLEMIA      PLAN:  In order of problems listed above:  Chest pain with normal coronary arteries cardiac catheterization 11/12/16  Essential hypertensionBlood pressure well controlled on amlodipine. Continue 2 g sodium diet. Follow-up with Dr.McAlhany in 4-5 months.  Hypercholesterolemia controlled with vytorin, managed by primary care.    Medication Adjustments/Labs and Tests Ordered: Current medicines are reviewed at length with the patient today.  Concerns regarding medicines are outlined above.  Medication changes, Labs and Tests ordered today are listed in the Patient Instructions below. There are no Patient Instructions on file for this visit.   Sumner Boast, PA-C  12/01/2016 12:35 PM    [facility] [place], [place], [place]  [address] Phone: [phone]; Fax: [phone]

## 2016-12-03 ENCOUNTER — Encounter: Payer: Self-pay | Admitting: Cardiovascular Disease

## 2016-12-05 ENCOUNTER — Ambulatory Visit: Payer: Medicare Other | Admitting: Cardiology

## 2016-12-08 ENCOUNTER — Telehealth: Payer: Self-pay | Admitting: *Deleted

## 2016-12-08 MED ORDER — METOPROLOL SUCCINATE ER 25 MG PO TB24: 25.0000 mg | ORAL_TABLET | Freq: Every day | ORAL | 6 refills | Status: DC

## 2016-12-08 NOTE — Telephone Encounter (Signed)
Unable to respond to pt via my chart as my chart is inactive.  I placed call to pt's home number and received a fast busy signal.  I tried mobile number but received message it is not a working number.

## 2016-12-08 NOTE — Telephone Encounter (Signed)
I spoke with pt and gave her recommendation from Dr. Angelena Form (see my chart message) regarding Toprol. Heart rate at last office visit was 83.  She would like to start Toprol. Will send to [facility] in [place].

## 2016-12-18 ENCOUNTER — Ambulatory Visit (HOSPITAL_BASED_OUTPATIENT_CLINIC_OR_DEPARTMENT_OTHER): Payer: Medicare Other | Attending: Physician Assistant | Admitting: Internal Medicine

## 2016-12-18 VITALS — Ht 64.0 in | Wt 187.0 lb

## 2016-12-18 DIAGNOSIS — G4733 Obstructive sleep apnea (adult) (pediatric): Secondary | ICD-10-CM | POA: Insufficient documentation

## 2016-12-18 DIAGNOSIS — R079 Chest pain, unspecified: Secondary | ICD-10-CM | POA: Diagnosis present

## 2016-12-21 IMAGING — CR DG LUMBAR SPINE 2-3V
2 series · 2 of 2 positions shown · non-contrast
Comparison: Postmyelogram CT scan 02/09/2015.

CLINICAL DATA: Preoperative examination. Patient for lumbar
surgery.

EXAM:
LUMBAR SPINE - 2-3 VIEW

[t l-spine a.p.]
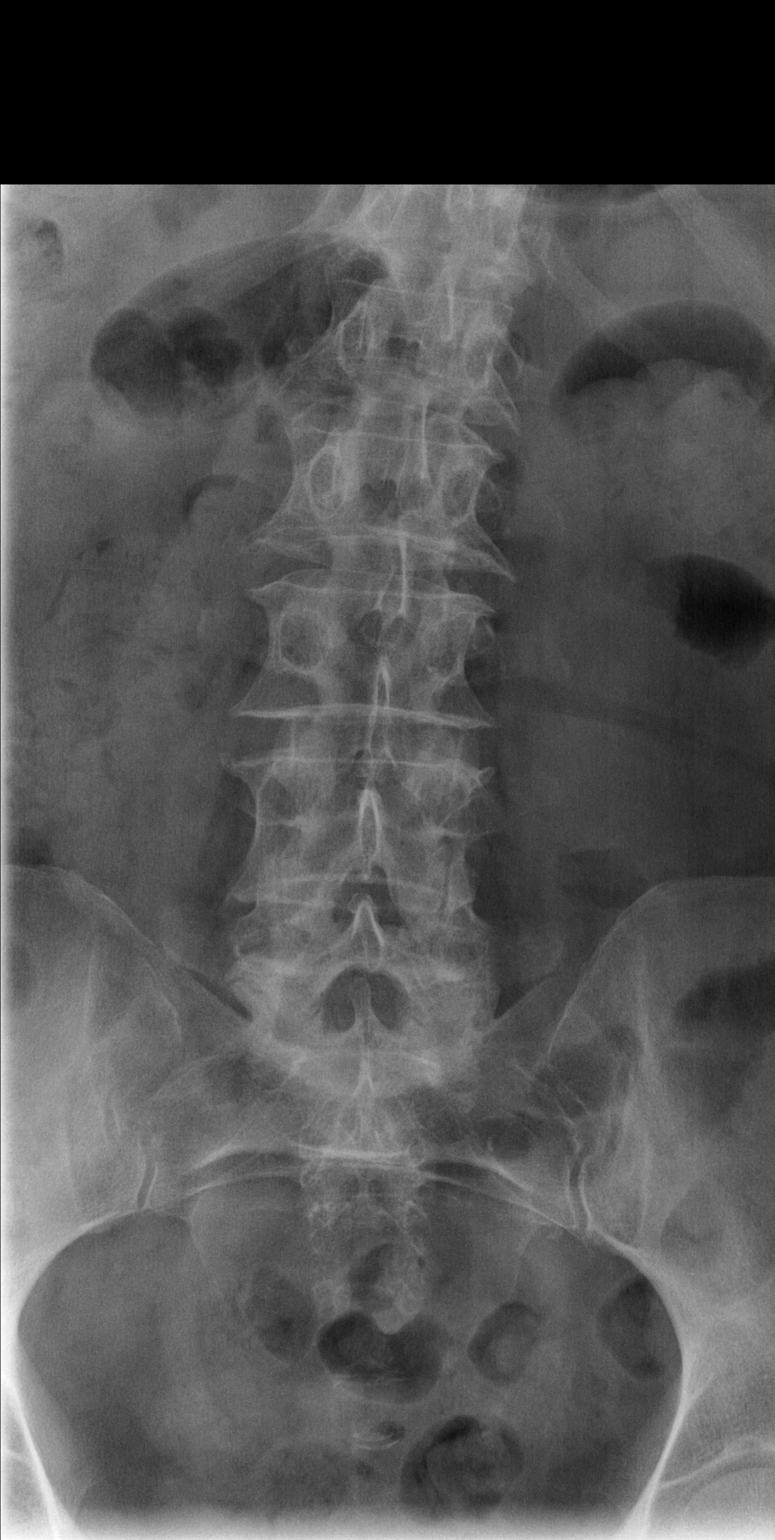

[t l-spine lat]
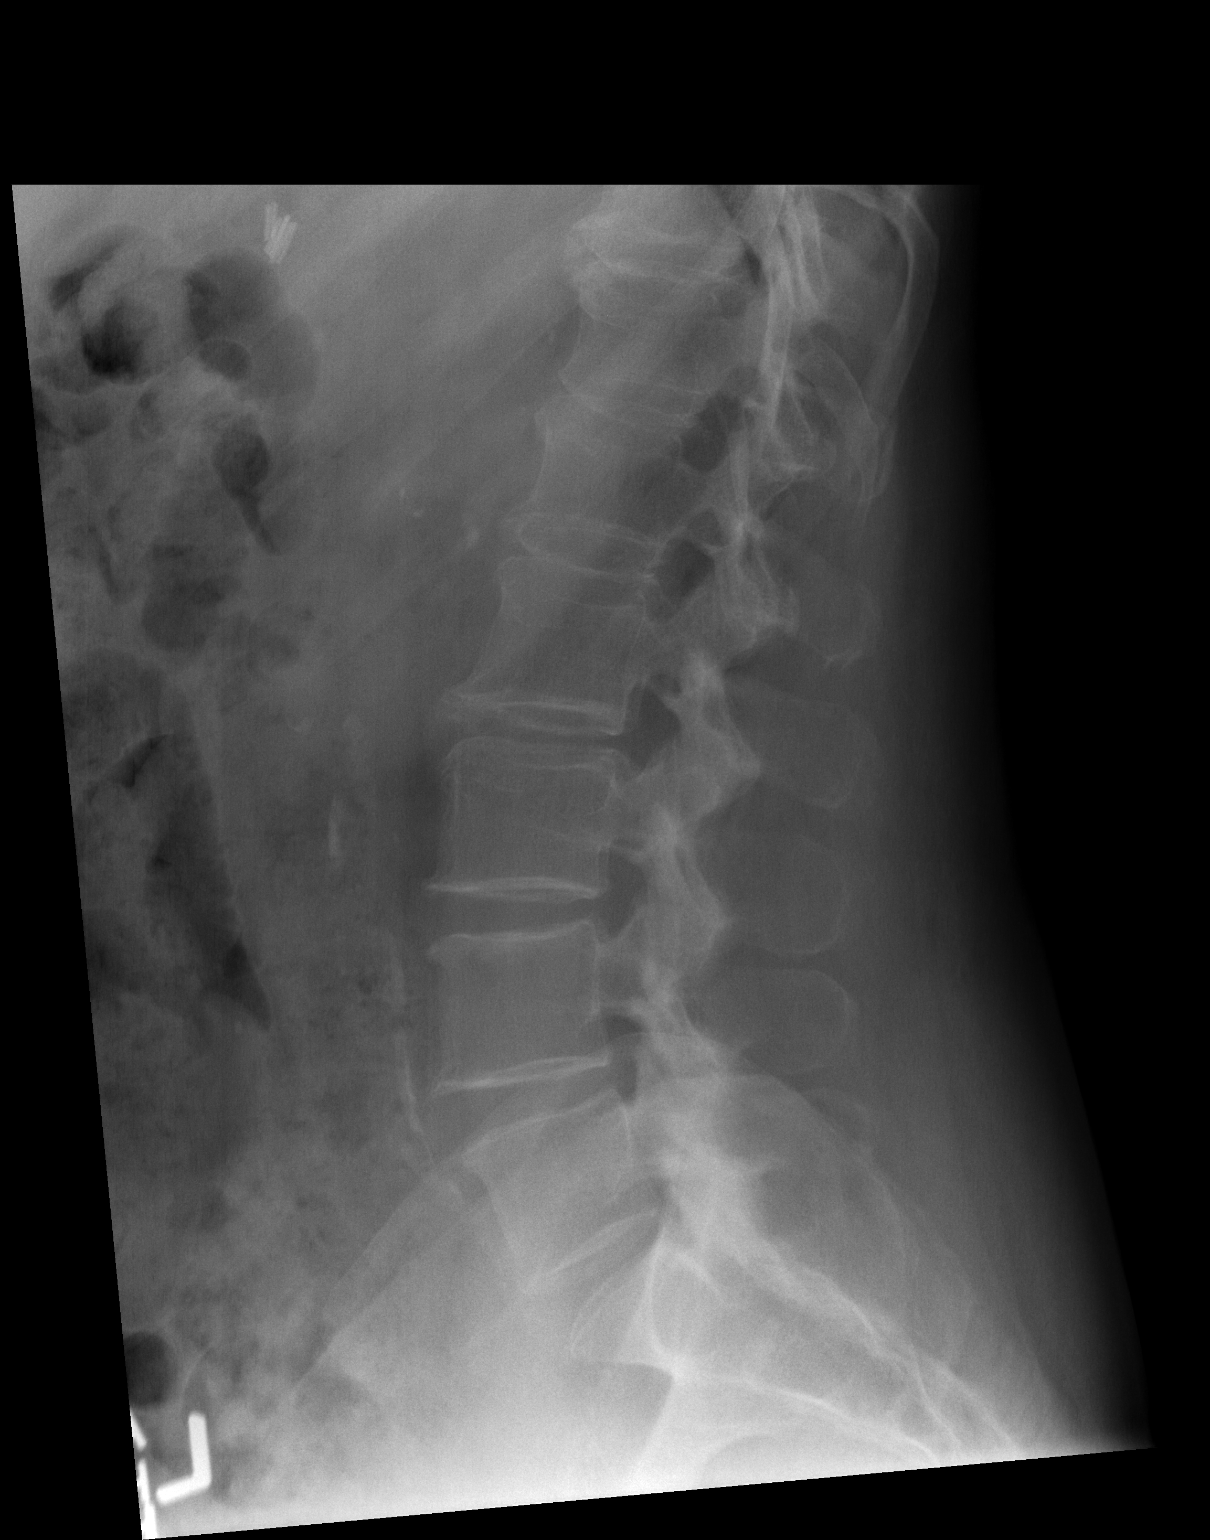

[2 of 2 positions shown; findings below may reference images not displayed]

FINDINGS: Vertebral body height and alignment are maintained. Facet
degenerative disease is seen at L4-5 and L5-S1. Scattered mild
anterior endplate spurring is most notable at T11-12. Paraspinous
structures are unremarkable.
IMPRESSION: No acute abnormality.  Lumbar spondylosis.

## 2016-12-21 IMAGING — DX DG SPINE 1V PORT
1 series · 1 of 1 positions shown · non-contrast
Comparison: None.

CLINICAL DATA: Intra op L4-5 decompression.

EXAM:
PORTABLE SPINE - 1 VIEW

[l-spine x-table]
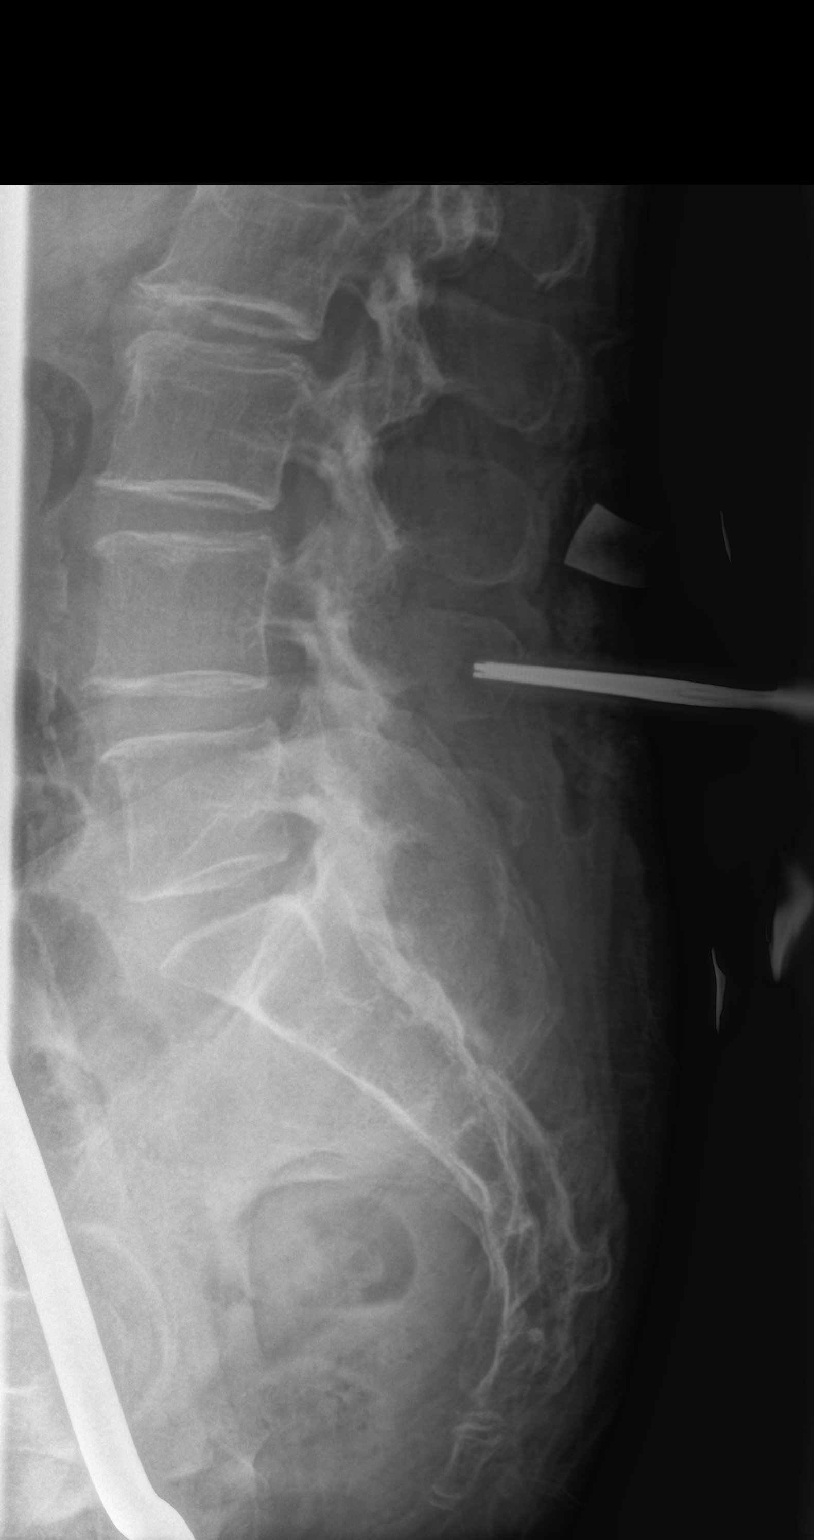

[1 of 1 positions shown; findings below may reference images not displayed]

FINDINGS: Single cross-table lateral view of the lumbar spine is provided.
There is a metallic instrument in the posterior paraspinal soft
tissues with the tip projecting over the L4 spinous process.

Degenerative disc disease at L2-3.
IMPRESSION: Intraoperative localization x-rays as detailed above.

## 2016-12-28 DIAGNOSIS — G4733 Obstructive sleep apnea (adult) (pediatric): Secondary | ICD-10-CM | POA: Diagnosis not present

## 2016-12-28 DIAGNOSIS — R079 Chest pain, unspecified: Secondary | ICD-10-CM | POA: Diagnosis not present

## 2016-12-28 NOTE — Procedures (Signed)
Patient Name: [name], Fath Date: 12/18/2016 Gender: Female D.O.B: 1945/09/25 Age (years): 11 Referring Provider: Aura Dials PA-C Height (inches): 75 Interpreting Physician: Baird Lyons MD, ABSM Weight (lbs): 187 RPSGT: Laren Everts BMI: 32 MRN: [id number] Neck Size: 16.00 CLINICAL INFORMATION Sleep Study Type: NPSG  Indication for sleep study: Daytime Fatigue, Hypertension, Insomnia, Morning Headaches, Non-refreshing Sleep, Obesity, Parasomnias, Snoring  Epworth Sleepiness Score: 1  SLEEP STUDY TECHNIQUE As per the AASM Manual for the Scoring of Sleep and Associated Events v2.3 (April 2016) with a hypopnea requiring 4% desaturations.  The channels recorded and monitored were frontal, central and occipital EEG, electrooculogram (EOG), submentalis EMG (chin), nasal and oral airflow, thoracic and abdominal wall motion, anterior tibialis EMG, snore microphone, electrocardiogram, and pulse oximetry.  MEDICATIONS Medications self-administered by patient taken the night of the study : none reported  SLEEP ARCHITECTURE The study was initiated at 10:53:13 PM and ended at 5:13:23 AM.  Sleep onset time was 45.4 minutes and the sleep efficiency was 70.6%. The total sleep time was 268.5 minutes.  Stage REM latency was N/A minutes.  The patient spent 12.48% of the night in stage N1 sleep, 87.52% in stage N2 sleep, 0.00% in stage N3 and 0.00% in REM.  Alpha intrusion was absent.  Supine sleep was 0.00%.  RESPIRATORY PARAMETERS The overall apnea/hypopnea index (AHI) was 10.5 per hour. There were 0 total apneas, including 0 obstructive, 0 central and 0 mixed apneas. There were 47 hypopneas and 27 RERAs.  The AHI during Stage REM sleep was N/A per hour.  AHI while supine was N/A per hour.  The mean oxygen saturation was 89.50%. The minimum SpO2 during sleep was 85.00%.  loud snoring was noted during this study.  CARDIAC DATA The 2 lead EKG demonstrated sinus  rhythm. The mean heart rate was 64.92 beats per minute. Other EKG findings include: PVCs.  LEG MOVEMENT DATA The total PLMS were 200 with a resulting PLMS index of 44.69. Associated arousal with leg movement index was 2.5 .  IMPRESSIONS - Mild obstructive sleep apnea occurred during this study (AHI = 10.5/h). - No significant central sleep apnea occurred during this study (CAI = 0.0/h). - Persistent oxygen desaturation was noted during this study (Min O2 = 85.00%, Mean 89.5%). - The patient snored with loud snoring volume.  - EKG findings include PVCs. - Moderate periodic limb movements of sleep occurred during the study. No significant associated arousals.  DIAGNOSIS - Obstructive Sleep Apnea (327.23 [G47.33 ICD-10]) - Nocturnal Hypoxemia (327.26 [G47.36 ICD-10]) - Periodic Limb Movement Syndrome (327.51 [G47.61 ICD-10])  RECOMMENDATIONS - CPAP titration study is recommended, to allow documentation that oxygen saturation is either corrected, or that supplemental O2 will be indicated at therapeutic CPAP. Other options would be based on clinical judgment. - Be careful with alcohol, sedatives and other CNS depressants that may worsen sleep apnea and disrupt normal sleep architecture. - Sleep hygiene should be reviewed to assess factors that may improve sleep quality. - Weight management and regular exercise should be initiated or continued if appropriate. - Consider treatment for periodic limb movement sleep disorder.  [Electronically signed] 12/28/2016 09:37 AM  Baird Lyons MD, [place], American Board of Sleep Medicine   NPI: [id number]

## 2017-05-11 ENCOUNTER — Other Ambulatory Visit: Payer: Self-pay | Admitting: Cardiovascular Disease

## 2017-06-23 ENCOUNTER — Encounter: Payer: Self-pay | Admitting: Cardiovascular Disease

## 2017-07-13 ENCOUNTER — Ambulatory Visit (INDEPENDENT_AMBULATORY_CARE_PROVIDER_SITE_OTHER): Payer: Medicare Other | Admitting: Cardiovascular Disease

## 2017-07-13 ENCOUNTER — Encounter: Payer: Self-pay | Admitting: Cardiovascular Disease

## 2017-07-13 VITALS — BP 126/72 | HR 72 | Ht 64.0 in | Wt 192.4 lb

## 2017-07-13 DIAGNOSIS — R072 Precordial pain: Secondary | ICD-10-CM | POA: Diagnosis not present

## 2017-07-13 DIAGNOSIS — I517 Cardiomegaly: Secondary | ICD-10-CM | POA: Diagnosis not present

## 2017-07-13 MED ORDER — METOPROLOL SUCCINATE ER 25 MG PO TB24: 25.0000 mg | ORAL_TABLET | Freq: Every day | ORAL | 3 refills | Status: DC

## 2017-07-13 NOTE — Progress Notes (Signed)
Chief Complaint  Patient presents with  . Follow-up    LVH    History of Present Illness: 72 yo female with history of GERD, HTN, HLD who is here today for cardiac follow up. She has been seen remotely by Dr. Fransico Him and reports a normal stress test at that time in 2009. I met her in September 2018 for chest pain. She has never smoked. She has no known CAD. Echo September 2018 with normal LV systolic function, IEPP=29-51%, LVH with small LVOT gradient, no SAM. Cardiac cath 11/12/16 with no evidence of CAD and normal filling pressures.   She is here today for follow up. The patient denies any chest pain, dyspnea, palpitations, lower extremity edema, orthopnea, PND, dizziness, near syncope or syncope.   Primary Care Provider: Selinda Orion   Past Medical History:  Diagnosis Date  . Allergic rhinitis, cause unspecified   . Arthritis   . Cancer ([place])    hx skin cancer  . Depression   . Esophageal stenosis   . GERD (gastroesophageal reflux disease)   . Hiatal hernia   . History of bronchitis as a child   . History of nonmelanoma skin cancer   . History of urinary tract infection   . Hyperlipidemia   . Leg fracture, right    2015  . Multiple falls   . Pneumonia    2015  . Unspecified essential hypertension    PT STOPPED BP MED WITH PCP KNOWLEDGE  . Wears glasses     Past Surgical History:  Procedure Laterality Date  . APPENDECTOMY  1984  . BLADDER SUSPENSION  SEVERAL YRS AGO  . HAND SURGERY     trigger finger release bilat   . HARDWARE REMOVAL Right 04/19/2014   Procedure: HARDWARE REMOVAL FROM RIGHT TIBIA;  Surgeon: [name], MD;  Location: [facility];  Service: Orthopedics;  Laterality: Right;  . LAPAROSCOPIC CHOLECYSTECTOMY  04/2004  . laporoscopy  1977   ovarian cyst  . LATERAL COLLATERAL LIGAMENT REPAIR, KNEE     RT KNEE  . LEFT HEART CATH AND CORONARY ANGIOGRAPHY N/A 11/12/2016   Procedure: LEFT HEART CATH AND CORONARY ANGIOGRAPHY;  Surgeon:  Burnell Blanks, MD;  Location: [facility];  Service: Cardiovascular;  Laterality: N/A;  . LUMBAR LAMINECTOMY/DECOMPRESSION MICRODISCECTOMY N/A 03/07/2015   Procedure: COMPLETE DECOMPRESSION/LUMBAR LAMINECTOMY L4 - L5 FOR STENOSIS 1 LEVEL;  Surgeon: Latanya Maudlin, MD;  Location: [facility];  Service: Orthopedics;  Laterality: N/A;  . ORIF TIBIA PLATEAU Right 12/26/2013   Procedure: OPEN REDUCTION INTERNAL FIXATION (ORIF) TIBIAL PLATEAU;  Surgeon: [name], MD;  Location: [facility];  Service: Orthopedics;  Laterality: Right;  . TOTAL ANKLE REPLACEMENT     7 years ago; left   . TOTAL KNEE ARTHROPLASTY Right 08/09/2014   Procedure: RIGHT TOTAL KNEE ARTHROPLASTY;  Surgeon: Gaynelle Arabian, MD;  Location: [facility];  Service: Orthopedics;  Laterality: Right;  . TUBAL LIGATION     75 reversal in 81 and tubal in 85    Current Outpatient Medications  Medication Sig Dispense Refill  . amLODipine (NORVASC) 10 MG tablet Take 10 mg by mouth every morning.    Marland Kitchen aspirin EC 81 MG tablet Take 81 mg by mouth daily.    . calcium citrate-vitamin D (CITRACAL+D) 315-200 MG-UNIT per tablet Take 1 tablet by mouth every morning.    . Cholecalciferol (VITAMIN D3) 400 units CAPS Take 400 Units by mouth daily.    . DULoxetine (  CYMBALTA) 30 MG capsule Take 60 mg by mouth daily.    . fluticasone (FLONASE) 50 MCG/ACT nasal spray Place 1 spray into the nose daily as needed for allergies.     Marland Kitchen ketotifen (ZADITOR) 0.025 % ophthalmic solution Place 1 drop into both eyes 2 (two) times daily as needed (for seasonal allergy eyes.).    Marland Kitchen metoprolol succinate (TOPROL-XL) 25 MG 24 hr tablet Take 1 tablet (25 mg total) by mouth daily. 90 tablet 3  . Multiple Vitamin (MULTIVITAMIN WITH MINERALS) TABS tablet Take 1 tablet by mouth daily.    Marland Kitchen omeprazole (PRILOSEC) 40 MG capsule Take 40 mg by mouth daily as needed (for acid reflux.).    Marland Kitchen polyvinyl alcohol (LUBRICANT DROPS) 1.4 % ophthalmic solution Place 1-2 drops into both  eyes 3 (three) times daily as needed for dry eyes.    . vitamin E 400 UNIT capsule Take 400 Units by mouth daily.    Marland Kitchen VYTORIN 10-40 MG per tablet Take 1 tablet by mouth at bedtime.  3   No current facility-administered medications for this visit.     No Known Allergies  Social History   Socioeconomic History  . Marital status: Divorced    Spouse name: Not on file  . Number of children: 1  . Years of education: Not on file  . Highest education level: Not on file  Occupational History  . Occupation: Retired    Comment: Systems analyst  . Financial resource strain: Not on file  . Food insecurity:    Worry: Not on file    Inability: Not on file  . Transportation needs:    Medical: Not on file    Non-medical: Not on file  Tobacco Use  . Smoking status: Never Smoker  . Smokeless tobacco: Never Used  Substance and Sexual Activity  . Alcohol use: Yes    Alcohol/week: 1.8 oz    Types: 3 Cans of beer per week    Comment: OCCASIONAL  . Drug use: No  . Sexual activity: Yes    Birth control/protection: None    Comment: not asked  Lifestyle  . Physical activity:    Days per week: Not on file    Minutes per session: Not on file  . Stress: Not on file  Relationships  . Social connections:    Talks on phone: Not on file    Gets together: Not on file    Attends religious service: Not on file    Active member of club or organization: Not on file    Attends meetings of clubs or organizations: Not on file    Relationship status: Not on file  . Intimate partner violence:    Fear of current or ex partner: Not on file    Emotionally abused: Not on file    Physically abused: Not on file    Forced sexual activity: Not on file  Other Topics Concern  . Not on file  Social History Narrative  . Not on file    Family History  Problem Relation Age of Onset  . Stomach cancer Brother 22       question if started pancreatic   . Diabetes Sister   . Heart disease  Brother   . Colon cancer Neg Hx     Review of Systems:  As stated in the HPI and otherwise negative.   BP 126/72   Pulse 72   Ht 5' 4" (1.626 m)   Wt 192 lb  6.4 oz (87.3 kg)   SpO2 96%   BMI 33.03 kg/m   Physical Examination:  General: Well developed, well nourished, NAD  HEENT: OP clear, mucus membranes moist  SKIN: warm, dry. No rashes. Neuro: No focal deficits  Musculoskeletal: Muscle strength 5/5 all ext  Psychiatric: Mood and affect normal  Neck: No JVD, no carotid bruits, no thyromegaly, no lymphadenopathy.  Lungs:Clear bilaterally, no wheezes, rhonci, crackles Cardiovascular: Regular rate and rhythm. No murmurs, gallops or rubs. Abdomen:Soft. Bowel sounds present. Non-tender.  Extremities: No lower extremity edema. Pulses are 2 + in the bilateral DP/PT.  Echo 11/06/16: - Left ventricle: There appears to be a small LVOT gradient   although SAM is not appreciated due to poor image quality. The   cavity size was normal. Wall thickness was increased in a pattern   of moderate LVH. Systolic function was normal. The estimated   ejection fraction was in the range of 60% to 65%. There was   dynamic obstruction at restin the outflow tract, with mid-cavity   obliteration, a peak velocity of 288 cm/sec, and a peak gradient   of 33 mm Hg. Wall motion was normal; there were no regional wall   motion abnormalities. Doppler parameters are consistent with   abnormal left ventricular relaxation (grade 1 diastolic   dysfunction). - Right ventricle: The cavity size was mildly dilated. Wall   thickness was normal. - Atrial septum: No defect or patent foramen ovale was identified.  EKG:  EKG is not  ordered today.  Recent Labs: 10/30/2016: BUN 14; Creatinine, Ser 0.90; Hemoglobin 15.8; Platelets 293; Potassium 4.2; Sodium 140   Lipid Panel    Wt Readings from Last 3 Encounters:  07/13/17 192 lb 6.4 oz (87.3 kg)  12/18/16 187 lb (84.8 kg)  12/01/16 190 lb 6.4 oz (86.4 kg)      Other studies Reviewed: Additional studies/ records that were reviewed today include:  Review of the above records demonstrates:   Assessment and Plan:   1. Chest pain: No evidence of CAD by cath 2018. Her chest pain is felt to be non-cardiac related.    2. LVH: No syncope or dizziness. She has a LVOT gradient but no SAM. Continue Toprol 25 mg daily.   Current medicines are reviewed at length with the patient today.  The patient does not have concerns regarding medicines.  The following changes have been made:  no change  Labs/ tests ordered today include:  No orders of the defined types were placed in this encounter.   Disposition:   FU with me in 12 months    Signed, Lauree Chandler, MD 07/13/2017 2:45 PM    [facility] [address], [place], [place]  [address] Phone: [phone]; Fax: [phone]

## 2017-07-13 NOTE — Patient Instructions (Signed)

## 2017-11-06 ENCOUNTER — Other Ambulatory Visit: Payer: Self-pay | Admitting: Physician Assistant

## 2017-11-06 DIAGNOSIS — K219 Gastro-esophageal reflux disease without esophagitis: Secondary | ICD-10-CM

## 2017-11-06 DIAGNOSIS — R1314 Dysphagia, pharyngoesophageal phase: Secondary | ICD-10-CM

## 2017-11-09 ENCOUNTER — Ambulatory Visit
Admission: RE | Admit: 2017-11-09 | Discharge: 2017-11-09 | Disposition: A | Discharge status: Home / Self Care | Payer: Medicare Other | Source: Ambulatory Visit | Attending: Physician Assistant | Admitting: Physician Assistant

## 2017-11-09 DIAGNOSIS — K219 Gastro-esophageal reflux disease without esophagitis: Secondary | ICD-10-CM

## 2017-11-09 DIAGNOSIS — R1314 Dysphagia, pharyngoesophageal phase: Secondary | ICD-10-CM

## 2018-01-15 ENCOUNTER — Ambulatory Visit (INDEPENDENT_AMBULATORY_CARE_PROVIDER_SITE_OTHER): Payer: Medicare Other | Admitting: Internal Medicine

## 2018-01-15 ENCOUNTER — Encounter: Payer: Self-pay | Admitting: Internal Medicine

## 2018-01-15 VITALS — BP 118/68 | HR 76 | Ht 64.0 in | Wt 186.0 lb

## 2018-01-15 DIAGNOSIS — G4733 Obstructive sleep apnea (adult) (pediatric): Secondary | ICD-10-CM

## 2018-01-15 DIAGNOSIS — F5104 Psychophysiologic insomnia: Secondary | ICD-10-CM | POA: Diagnosis not present

## 2018-01-15 MED ORDER — SUVOREXANT 15 MG PO TABS: 15.0000 mg | ORAL_TABLET | Freq: Every day | ORAL | 5 refills | Status: DC

## 2018-01-15 NOTE — Assessment & Plan Note (Signed)
Insomnia has been a big issue in the past.  Hopefully we will be able to introduce CPAP for a fair trial despite her insomnia tendency. Plan-try adding Belsomra.  We need to help her get used to using CPAP without aggravating her insomnia.

## 2018-01-15 NOTE — Patient Instructions (Signed)
Order- new DME- [employer]- new CPAP auto 5-20, mask of choice, humidifier, supplies, [facility]  Script printed for Belsomra 15 mg    1 at bedtime for sleep

## 2018-01-15 NOTE — Progress Notes (Signed)
01/15/2018-72 year old female never smoker for sleep consult.  Followed previously by Dr. Gwenette Greet in 2012 for psychophysiologic insomnia. She was educated in 2012 on basic sleep hygiene and tried with trazodone, Ambien, sleep restriction and stress reduction techniques. Medical Problem List includes allergic rhinitis, HBP, GERD, psychophysiological insomnia, NPSG-12/18/2016-AHI 10.5/hour, desaturation to 85%, mean saturation 89.5%, periodic limb movement -----previous pt of Dr. Gwenette Greet, snores at night, feels tired when she wakes up in the morning, never been on CPAP Husband complains of her loud snoring and pushes her to stop snoring.  He tells her of witnessed apneas. Bedtime between 9 and 11 PM but reads for a couple of hours before able to fall asleep.  Some waking during the night before up at 7 AM.  Using Benadryl 25 mg for sleep.  1-2 cups morning coffee. Seasonal allergic rhinitis with postnasal drip.  Uses daily Claritin and occasional Flonase.  Blames drip for cough which has bothered her for a year or 2.  CXR reported normal 1 year ago.  Prior to Admission medications   Medication Sig Start Date End Date Taking? Authorizing Provider  amLODipine (NORVASC) 10 MG tablet Take 10 mg by mouth every morning.   Yes [provider]  aspirin EC 81 MG tablet Take 81 mg by mouth daily.   Yes [provider]  calcium citrate-vitamin D (CITRACAL+D) 315-200 MG-UNIT per tablet Take 1 tablet by mouth every morning.   Yes [provider]  Cholecalciferol (VITAMIN D3) 400 units CAPS Take 400 Units by mouth daily.   Yes [provider]  DULoxetine (CYMBALTA) 30 MG capsule Take 60 mg by mouth daily. 10/24/14  Yes [provider]  fluticasone (FLONASE) 50 MCG/ACT nasal spray Place 1 spray into the nose daily as needed for allergies.  07/11/14  Yes [provider]  ketotifen (ZADITOR) 0.025 % ophthalmic solution Place 1 drop into both eyes 2 (two) times daily as  needed (for seasonal allergy eyes.).   Yes [provider]  metoprolol succinate (TOPROL-XL) 25 MG 24 hr tablet Take 1 tablet (25 mg total) by mouth daily. 07/13/17  Yes Burnell Blanks, MD  Multiple Vitamin (MULTIVITAMIN WITH MINERALS) TABS tablet Take 1 tablet by mouth daily.   Yes [provider]  omeprazole (PRILOSEC) 40 MG capsule Take 40 mg by mouth daily as needed (for acid reflux.).   Yes [provider]  polyvinyl alcohol (LUBRICANT DROPS) 1.4 % ophthalmic solution Place 1-2 drops into both eyes 3 (three) times daily as needed for dry eyes.   Yes [provider]  vitamin E 400 UNIT capsule Take 400 Units by mouth daily.   Yes [provider]  VYTORIN 10-40 MG per tablet Take 1 tablet by mouth at bedtime. 06/20/14  Yes [provider]  Suvorexant (BELSOMRA) 15 MG TABS Take 15 mg by mouth at bedtime. 01/15/18   Deneise Lever, MD   Past Medical History:  Diagnosis Date  . Allergic rhinitis, cause unspecified   . Arthritis   . Cancer ([place])    hx skin cancer  . Depression   . Esophageal stenosis   . GERD (gastroesophageal reflux disease)   . Hiatal hernia   . History of bronchitis as a child   . History of nonmelanoma skin cancer   . History of urinary tract infection   . Hyperlipidemia   . Leg fracture, right    2015  . Multiple falls   . Pneumonia    2015  . Unspecified essential  hypertension    PT STOPPED BP MED WITH PCP KNOWLEDGE  . Wears glasses    Past Surgical History:  Procedure Laterality Date  . APPENDECTOMY  1984  . BLADDER SUSPENSION  SEVERAL YRS AGO  . HAND SURGERY     trigger finger release bilat   . HARDWARE REMOVAL Right 04/19/2014   Procedure: HARDWARE REMOVAL FROM RIGHT TIBIA;  Surgeon: [name], MD;  Location: [facility];  Service: Orthopedics;  Laterality: Right;  . LAPAROSCOPIC CHOLECYSTECTOMY  04/2004  . laporoscopy  1977   ovarian cyst  . LATERAL COLLATERAL LIGAMENT REPAIR, KNEE      RT KNEE  . LEFT HEART CATH AND CORONARY ANGIOGRAPHY N/A 11/12/2016   Procedure: LEFT HEART CATH AND CORONARY ANGIOGRAPHY;  Surgeon: Burnell Blanks, MD;  Location: [facility];  Service: Cardiovascular;  Laterality: N/A;  . LUMBAR LAMINECTOMY/DECOMPRESSION MICRODISCECTOMY N/A 03/07/2015   Procedure: COMPLETE DECOMPRESSION/LUMBAR LAMINECTOMY L4 - L5 FOR STENOSIS 1 LEVEL;  Surgeon: Latanya Maudlin, MD;  Location: [facility];  Service: Orthopedics;  Laterality: N/A;  . ORIF TIBIA PLATEAU Right 12/26/2013   Procedure: OPEN REDUCTION INTERNAL FIXATION (ORIF) TIBIAL PLATEAU;  Surgeon: [name], MD;  Location: [facility];  Service: Orthopedics;  Laterality: Right;  . TOTAL ANKLE REPLACEMENT     7 years ago; left   . TOTAL KNEE ARTHROPLASTY Right 08/09/2014   Procedure: RIGHT TOTAL KNEE ARTHROPLASTY;  Surgeon: Gaynelle Arabian, MD;  Location: [facility];  Service: Orthopedics;  Laterality: Right;  . TUBAL LIGATION     75 reversal in 81 and tubal in 67   Family History  Problem Relation Age of Onset  . Stomach cancer Brother 63       question if started pancreatic   . Diabetes Sister   . Heart disease Brother   . Colon cancer Neg Hx    Social History   Socioeconomic History  . Marital status: Divorced    Spouse name: Not on file  . Number of children: 1  . Years of education: Not on file  . Highest education level: Not on file  Occupational History  . Occupation: Retired    Comment: Systems analyst  . Financial resource strain: Not on file  . Food insecurity:    Worry: Not on file    Inability: Not on file  . Transportation needs:    Medical: Not on file    Non-medical: Not on file  Tobacco Use  . Smoking status: Never Smoker  . Smokeless tobacco: Never Used  Substance and Sexual Activity  . Alcohol use: Yes    Alcohol/week: 3.0 standard drinks    Types: 3 Cans of beer per week    Comment: OCCASIONAL  . Drug use: No  . Sexual activity: Yes    Birth  control/protection: None    Comment: not asked  Lifestyle  . Physical activity:    Days per week: Not on file    Minutes per session: Not on file  . Stress: Not on file  Relationships  . Social connections:    Talks on phone: Not on file    Gets together: Not on file    Attends religious service: Not on file    Active member of club or organization: Not on file    Attends meetings of clubs or organizations: Not on file    Relationship status: Not on file  . Intimate partner violence:    Fear of current or ex partner:  Not on file    Emotionally abused: Not on file    Physically abused: Not on file    Forced sexual activity: Not on file  Other Topics Concern  . Not on file  Social History Narrative  . Not on file   ROS-see HPI   + = positive Constitutional:    weight loss, night sweats, fevers, chills, fatigue, lassitude. HEENT:    headaches, difficulty swallowing, tooth/dental problems, sore throat, + tinnitus      sneezing, itching, ear ache, nasal congestion, post nasal drip, snoring CV:    chest pain, orthopnea, PND, swelling in lower extremities, anasarca,                                  dizziness, palpitations Resp:   +shortness of breath with exertion or at rest.                productive cough,   non-productive cough, coughing up of blood.              change in color of mucus.  wheezing.   Skin:    rash or lesions. GI:    +heartburn, +indigestion, abdominal pain, nausea, vomiting, diarrhea,                 change in bowel habits, loss of appetite GU: dysuria, change in color of urine, no urgency or frequency.   flank pain. MS:   joint pain, stiffness, decreased range of motion, back pain. Neuro-     nothing unusual Psych:  change in mood or affect.  depression or anxiety.   memory loss.  OBJ- Physical Exam General- Alert, Oriented, Affect-appropriate, Distress- none acute Skin- rash-none, lesions- none, excoriation- none Lymphadenopathy- none Head- atraumatic             Eyes- Gross vision intact, PERRLA, conjunctivae and secretions clear            Ears- Hearing, canals-normal            Nose- Clear, no-Septal dev, mucus, polyps, erosion, perforation             Throat- Mallampati II , mucosa clear , drainage- none, tonsils- atrophic Neck- flexible , trachea midline, no stridor , thyroid nl, carotid no bruit Chest - symmetrical excursion , unlabored           Heart/CV- RRR , no murmur , no gallop  , no rub, nl s1 s2                           - JVD- none , edema- none, stasis changes- none, varices- none           Lung- clear to P&A, wheeze- none, cough- none , dullness-none, rub- none           Chest wall-  Abd-  Br/ Gen/ Rectal- Not done, not indicated Extrem- cyanosis- none, clubbing, none, atrophy- none, strength- nl Neuro- grossly intact to observation

## 2018-01-15 NOTE — Assessment & Plan Note (Signed)
Wheezing discussed conservative and more interventional therapies available for her relatively mild OSA.  Because of the significant complaint about snoring we are going to try CPAP first

## 2018-02-25 ENCOUNTER — Telehealth: Payer: Self-pay | Admitting: Internal Medicine

## 2018-02-25 MED ORDER — LORAZEPAM 1 MG PO TABS: 0.5000 mg | ORAL_TABLET | Freq: Every evening | ORAL | 1 refills | Status: DC | PRN

## 2018-02-25 NOTE — Telephone Encounter (Signed)
Call made to patient, patient states she has an appointment next week to take it in. She states her mask is fitting well however she still feels tired and Belsomra is not helping her sleep at all. She would like recommendations from CY.   NKDA  CY please advise. Patient Cpap report has been placed at your work station in [employer]. She is requesting something else for sleep.

## 2018-02-25 NOTE — Telephone Encounter (Signed)
For the insomnia complaint, not relieved by Belsomra, suggest we offer Lorazepam 1 mg, # 30, ref x 5                                                                                                                     1/2 or 1 tab at bedtime as needed

## 2018-02-25 NOTE — Telephone Encounter (Signed)
Spoke with pt. She is aware of this medication change. Rx has been called in for #90 per the pt's request.  CY - please advise on CPAP download. Thanks!

## 2018-03-01 NOTE — Telephone Encounter (Signed)
Dr. Annamaria Boots please advise on cpap download.  Thanks!

## 2018-03-01 NOTE — Telephone Encounter (Signed)
I hope lorazepam we authorized last week is helping sleep.  CPAP download looks fine. She is using it, and it is controlling her apneas. If she is still tired in the daytime, suggest trying to get more sleep, including naps with CPAP. Also can see if caffeine helps.

## 2018-03-01 NOTE — Telephone Encounter (Signed)
lmtcb for pt.  

## 2018-03-02 NOTE — Telephone Encounter (Signed)
Given her history of previous meds tried, I don't have other medication suggestions Offer referral to Dr Orion Crook psychology group for help with chronic insomnia.

## 2018-03-02 NOTE — Telephone Encounter (Signed)
Called and spoke with patient, she stated that she started using the half tablet of the Lorazepam and it did not help so she started taking a whole tablet. Patient stated that this is still not helping. She would like advisement on what to do. CY please advise, thank you.   Current Outpatient Medications on File Prior to Visit  Medication Sig Dispense Refill  . amLODipine (NORVASC) 10 MG tablet Take 10 mg by mouth every morning.    Marland Kitchen aspirin EC 81 MG tablet Take 81 mg by mouth daily.    . calcium citrate-vitamin D (CITRACAL+D) 315-200 MG-UNIT per tablet Take 1 tablet by mouth every morning.    . Cholecalciferol (VITAMIN D3) 400 units CAPS Take 400 Units by mouth daily.    . DULoxetine (CYMBALTA) 30 MG capsule Take 60 mg by mouth daily.    . fluticasone (FLONASE) 50 MCG/ACT nasal spray Place 1 spray into the nose daily as needed for allergies.     Marland Kitchen ketotifen (ZADITOR) 0.025 % ophthalmic solution Place 1 drop into both eyes 2 (two) times daily as needed (for seasonal allergy eyes.).    Marland Kitchen metoprolol succinate (TOPROL-XL) 25 MG 24 hr tablet Take 1 tablet (25 mg total) by mouth daily. 90 tablet 3  . Multiple Vitamin (MULTIVITAMIN WITH MINERALS) TABS tablet Take 1 tablet by mouth daily.    Marland Kitchen omeprazole (PRILOSEC) 40 MG capsule Take 40 mg by mouth daily as needed (for acid reflux.).    Marland Kitchen polyvinyl alcohol (LUBRICANT DROPS) 1.4 % ophthalmic solution Place 1-2 drops into both eyes 3 (three) times daily as needed for dry eyes.    . Suvorexant (BELSOMRA) 15 MG TABS Take 15 mg by mouth at bedtime. 30 tablet 5  . vitamin E 400 UNIT capsule Take 400 Units by mouth daily.    Marland Kitchen VYTORIN 10-40 MG per tablet Take 1 tablet by mouth at bedtime.  3   No current facility-administered medications on file prior to visit.    No Known Allergies

## 2018-03-02 NOTE — Telephone Encounter (Signed)
Spoke with pt, I advised her of message from [place]. Pt understood and wanted to wait until she sees him at her follow on 03/24/2018. Will hold off on referral for now. Nothing further is needed.

## 2018-03-23 ENCOUNTER — Encounter: Payer: Self-pay | Admitting: Internal Medicine

## 2018-03-24 ENCOUNTER — Ambulatory Visit (INDEPENDENT_AMBULATORY_CARE_PROVIDER_SITE_OTHER): Payer: Medicare Other | Admitting: Internal Medicine

## 2018-03-24 ENCOUNTER — Encounter: Payer: Self-pay | Admitting: Internal Medicine

## 2018-03-24 VITALS — BP 110/68 | HR 72 | Ht 64.0 in | Wt 193.6 lb

## 2018-03-24 DIAGNOSIS — G4733 Obstructive sleep apnea (adult) (pediatric): Secondary | ICD-10-CM

## 2018-03-24 DIAGNOSIS — F5101 Primary insomnia: Secondary | ICD-10-CM

## 2018-03-24 DIAGNOSIS — K219 Gastro-esophageal reflux disease without esophagitis: Secondary | ICD-10-CM | POA: Diagnosis not present

## 2018-03-24 MED ORDER — TRAZODONE HCL 50 MG PO TABS: 50.0000 mg | ORAL_TABLET | Freq: Every evening | ORAL | 5 refills | Status: DC | PRN

## 2018-03-24 NOTE — Progress Notes (Signed)
HPI female never smoker followed for OSA, insomnia, complicated by allergic rhinitis, HBP, GERD NPSG-12/18/2016-AHI 10.5/hour, desaturation to 85%, mean saturation 89.5%, periodic limb movement  -------------------------------------------------------------------------------------- 01/15/2018-73 year old female never smoker for sleep consult.  Followed previously by Dr. Gwenette Greet in 2012 for psychophysiologic insomnia. She was educated in 2012 on basic sleep hygiene and tried with trazodone, Ambien, sleep restriction and stress reduction techniques. Medical Problem List includes allergic rhinitis, HBP, GERD, psychophysiological insomnia, NPSG-12/18/2016-AHI 10.5/hour, desaturation to 85%, mean saturation 89.5%, periodic limb movement -----previous pt of Dr. Gwenette Greet, snores at night, feels tired when she wakes up in the morning, never been on CPAP Husband complains of her loud snoring and pushes her to stop snoring.  He tells her of witnessed apneas. Bedtime between 9 and 11 PM but reads for a couple of hours before able to fall asleep.  Some waking during the night before up at 7 AM.  Using Benadryl 25 mg for sleep.  1-2 cups morning coffee. Seasonal allergic rhinitis with postnasal drip.  Uses daily Claritin and occasional Flonase.  Blames drip for cough which has bothered her for a year or 2.  CXR reported normal 1 year ago.  03/24/2018-73 year old female never smoker followed for OSA, insomnia, complicated by allergic rhinitis, HBP, GERD,  -----OSA-CPAP gong well Belsomra 15 mg CPAP auto 5-20/[employer] Download 93% compliance AHI 2.2/hour She says CPAP is "now my best friend".  She sleeps well with it and denies concerns.  We reviewed download. Nonspecific insomnia remains an issue.  She was not impressed with how Belsomra worked.  She has been on the Internet looking at options I would like to try trazodone, which is cheaper-discussed. She does note occasional reflux and is being treated for  it.  ROS-see HPI   + = positive Constitutional:    weight loss, night sweats, fevers, chills, fatigue, lassitude. HEENT:    headaches, difficulty swallowing, tooth/dental problems, sore throat, + tinnitus      sneezing, itching, ear ache, nasal congestion, post nasal drip, snoring CV:    chest pain, orthopnea, PND, swelling in lower extremities, anasarca,                                  dizziness, palpitations Resp:   +shortness of breath with exertion or at rest.                productive cough,   non-productive cough, coughing up of blood.              change in color of mucus.  wheezing.   Skin:    rash or lesions. GI:    +heartburn, +indigestion, abdominal pain, nausea, vomiting, diarrhea,                 change in bowel habits, loss of appetite GU: dysuria, change in color of urine, no urgency or frequency.   flank pain. MS:   joint pain, stiffness, decreased range of motion, back pain. Neuro-     nothing unusual Psych:  change in mood or affect.  depression or anxiety.   memory loss.  OBJ- Physical Exam General- Alert, Oriented, Affect-appropriate, Distress- none acute, + overweight Skin- rash-none, lesions- none, excoriation- none Lymphadenopathy- none Head- atraumatic            Eyes- Gross vision intact, PERRLA, conjunctivae and secretions clear            Ears- Hearing, canals-normal  Nose- Clear, no-Septal dev, mucus, polyps, erosion, perforation             Throat- Mallampati III , mucosa clear , drainage- none, tonsils- atrophic Neck- flexible , trachea midline, no stridor , thyroid nl, carotid no bruit Chest - symmetrical excursion , unlabored           Heart/CV- RRR , no murmur , no gallop  , no rub, nl s1 s2                           - JVD- none , edema- none, stasis changes- none, varices- none           Lung- clear to P&A, wheeze- none, cough- none , dullness-none, rub- none           Chest wall-  Abd-  Br/ Gen/ Rectal- Not done, not indicated Extrem-  cyanosis- none, clubbing, none, atrophy- none, strength- nl Neuro- grossly intact to observation

## 2018-03-24 NOTE — Patient Instructions (Addendum)
Order- DME Huey Romans- please change CPAP auto range to 8-15, continue mask of choice, humidifier, supplies, AirView  Script for Trazodone to use at bedtime for sleep if needed As discussed- watch for possible drug interaction wiith Cymbalta   Please call if we can help

## 2018-03-24 NOTE — Assessment & Plan Note (Signed)
We discussed basic sleep hygiene.  Respecting her age, we discussed the role of pharmaceuticals. Plan-replace Belsomra with trazodone as discussed.

## 2018-03-24 NOTE — Assessment & Plan Note (Signed)
She is aware this continues despite treatment although heartburn is controlled. Plan-she needs to discuss this with her PCP.

## 2018-03-24 NOTE — Assessment & Plan Note (Signed)
She is benefiting from CPAP with improved sleep quality.  Download confirms excellent compliance and control.  We will try narrowing her pressure range a little. Plan-adjust CPAP to auto 8-15

## 2018-04-01 ENCOUNTER — Telehealth: Payer: Self-pay | Admitting: Internal Medicine

## 2018-04-01 DIAGNOSIS — G4733 Obstructive sleep apnea (adult) (pediatric): Secondary | ICD-10-CM

## 2018-04-01 NOTE — Telephone Encounter (Signed)
Spoke with the pt and notified of change  She verbalized understanding  Order was sent to [facility]

## 2018-04-01 NOTE — Telephone Encounter (Signed)
Please change CPAP to auto 8-12

## 2018-04-01 NOTE — Telephone Encounter (Signed)
Called spoke with the patient she states she call not tolerate the knew cpap pressure she was placed on   8-15 on 03/24/2018. She would like it to be lowered back down some.  Dr. Annamaria Boots please advise

## 2018-06-04 ENCOUNTER — Encounter (INDEPENDENT_AMBULATORY_CARE_PROVIDER_SITE_OTHER): Payer: Self-pay | Admitting: Ophthalmology

## 2018-06-04 ENCOUNTER — Ambulatory Visit (INDEPENDENT_AMBULATORY_CARE_PROVIDER_SITE_OTHER): Payer: Medicare Other | Admitting: Ophthalmology

## 2018-06-04 DIAGNOSIS — H109 Unspecified conjunctivitis: Secondary | ICD-10-CM

## 2018-06-04 DIAGNOSIS — Z961 Presence of intraocular lens: Secondary | ICD-10-CM

## 2018-06-04 MED ORDER — BESIFLOXACIN HCL 0.6 % OP SUSP: 1.0000 [drp] | Freq: Four times a day (QID) | OPHTHALMIC | 0 refills | Status: DC

## 2018-06-04 NOTE — Progress Notes (Signed)
[facility] Note  06/04/2018     CHIEF COMPLAINT Patient presents for Eye Problem   HISTORY OF PRESENT ILLNESS: [name] is a 73 y.o. female who presents to the clinic today for:   HPI    Pt w/ history of pseudophakia OU. Reports she had cataract surgery OS 3.5.2020 w/ Dr. Talbert Forest. Was doing well, but two wks ago was told she had an "infection" by Dr. Gwynn Burly and was started on Durezol TID OS. Pt reports blurred vision and purulent discharge OS x1 day.   Last edited by Bernarda Caffey, MD on 06/04/2018  6:21 PM. (History)      Referring physician: Aura Dials, PA-C [place], [place] [address]  HISTORICAL INFORMATION:   Selected notes from the MEDICAL RECORD NUMBER    CURRENT MEDICATIONS: Current Outpatient Medications (Ophthalmic Drugs)  Medication Sig  . Besifloxacin HCl 0.6 % SUSP Place 1 drop into the left eye 4 (four) times daily.  Marland Kitchen ketotifen (ZADITOR) 0.025 % ophthalmic solution Place 1 drop into both eyes 2 (two) times daily as needed (for seasonal allergy eyes.).  Marland Kitchen polyvinyl alcohol (LUBRICANT DROPS) 1.4 % ophthalmic solution Place 1-2 drops into both eyes 3 (three) times daily as needed for dry eyes.   No current facility-administered medications for this visit.  (Ophthalmic Drugs)   Current Outpatient Medications (Other)  Medication Sig  . amLODipine (NORVASC) 10 MG tablet Take 10 mg by mouth every morning.  Marland Kitchen aspirin EC 81 MG tablet Take 81 mg by mouth daily.  . calcium citrate-vitamin D (CITRACAL+D) 315-200 MG-UNIT per tablet Take 1 tablet by mouth every morning.  . Cholecalciferol (VITAMIN D3) 400 units CAPS Take 400 Units by mouth daily.  . DULoxetine (CYMBALTA) 30 MG capsule Take 60 mg by mouth daily.  . fluticasone (FLONASE) 50 MCG/ACT nasal spray Place 1 spray into the nose daily as needed for allergies.   Marland Kitchen LORazepam (ATIVAN) 1 MG tablet Take 0.5-1 tablets (0.5-1 mg total) by mouth at bedtime as needed  for sleep.  . metoprolol succinate (TOPROL-XL) 25 MG 24 hr tablet Take 1 tablet (25 mg total) by mouth daily.  . Multiple Vitamin (MULTIVITAMIN WITH MINERALS) TABS tablet Take 1 tablet by mouth daily.  Marland Kitchen omeprazole (PRILOSEC) 40 MG capsule Take 40 mg by mouth daily as needed (for acid reflux.).  Marland Kitchen traZODone (DESYREL) 50 MG tablet Take 1 tablet (50 mg total) by mouth at bedtime as needed for sleep.  . vitamin E 400 UNIT capsule Take 400 Units by mouth daily.  Marland Kitchen VYTORIN 10-40 MG per tablet Take 1 tablet by mouth at bedtime.   No current facility-administered medications for this visit.  (Other)      REVIEW OF SYSTEMS: ROS    Positive for: Cardiovascular, Eyes, Psychiatric, Allergic/Imm   Negative for: Constitutional, Gastrointestinal, Neurological, Skin, Genitourinary, Musculoskeletal, HENT, Endocrine, Respiratory, Heme/Lymph   Last edited by Bernarda Caffey, MD on 06/04/2018  6:23 PM. (History)       ALLERGIES No Known Allergies  PAST MEDICAL HISTORY Past Medical History:  Diagnosis Date  . Allergic rhinitis, cause unspecified   . Arthritis   . Cancer ([place])    hx skin cancer  . Depression   . Esophageal stenosis   . GERD (gastroesophageal reflux disease)   . Hiatal hernia   . History of bronchitis as a child   . History of nonmelanoma skin cancer   . History of urinary tract infection   .  Hyperlipidemia   . Leg fracture, right    2015  . Multiple falls   . Pneumonia    2015  . Unspecified essential hypertension    PT STOPPED BP MED WITH PCP KNOWLEDGE  . Wears glasses    Past Surgical History:  Procedure Laterality Date  . APPENDECTOMY  1984  . BLADDER SUSPENSION  SEVERAL YRS AGO  . HAND SURGERY     trigger finger release bilat   . HARDWARE REMOVAL Right 04/19/2014   Procedure: HARDWARE REMOVAL FROM RIGHT TIBIA;  Surgeon: [name], MD;  Location: [facility];  Service: Orthopedics;  Laterality: Right;  . LAPAROSCOPIC CHOLECYSTECTOMY  04/2004  . laporoscopy  1977    ovarian cyst  . LATERAL COLLATERAL LIGAMENT REPAIR, KNEE     RT KNEE  . LEFT HEART CATH AND CORONARY ANGIOGRAPHY N/A 11/12/2016   Procedure: LEFT HEART CATH AND CORONARY ANGIOGRAPHY;  Surgeon: Burnell Blanks, MD;  Location: [facility];  Service: Cardiovascular;  Laterality: N/A;  . LUMBAR LAMINECTOMY/DECOMPRESSION MICRODISCECTOMY N/A 03/07/2015   Procedure: COMPLETE DECOMPRESSION/LUMBAR LAMINECTOMY L4 - L5 FOR STENOSIS 1 LEVEL;  Surgeon: Latanya Maudlin, MD;  Location: [facility];  Service: Orthopedics;  Laterality: N/A;  . ORIF TIBIA PLATEAU Right 12/26/2013   Procedure: OPEN REDUCTION INTERNAL FIXATION (ORIF) TIBIAL PLATEAU;  Surgeon: [name], MD;  Location: [facility];  Service: Orthopedics;  Laterality: Right;  . TOTAL ANKLE REPLACEMENT     7 years ago; left   . TOTAL KNEE ARTHROPLASTY Right 08/09/2014   Procedure: RIGHT TOTAL KNEE ARTHROPLASTY;  Surgeon: Gaynelle Arabian, MD;  Location: [facility];  Service: Orthopedics;  Laterality: Right;  . TUBAL LIGATION     76 reversal in 82 and tubal in 56    FAMILY HISTORY Family History  Problem Relation Age of Onset  . Stomach cancer Brother 24       question if started pancreatic   . Diabetes Sister   . Heart disease Brother   . Colon cancer Neg Hx     SOCIAL HISTORY Social History   Tobacco Use  . Smoking status: Never Smoker  . Smokeless tobacco: Never Used  Substance Use Topics  . Alcohol use: Yes    Alcohol/week: 3.0 standard drinks    Types: 3 Cans of beer per week    Comment: OCCASIONAL  . Drug use: No         OPHTHALMIC EXAM:  Base Eye Exam    Visual Acuity (Snellen - Linear)      Right Left   Dist [place] 20/20 -1 20/20       Tonometry (Tonopen, 7:31 PM)      Right Left   Pressure 18 18       Pupils      Pupils Dark Light Shape React APD   Right PERRL 5 3 Round 2+ -   Left PERRL 5 3 Round 2+ -       Visual Fields      Left Right    Full Full       Extraocular Movement      Right Left     Full Full       Neuro/Psych    Oriented x3:  Yes   Mood/Affect:  Normal        Slit Lamp and Fundus Exam    External Exam      Right Left   External Normal Normal       Slit Lamp Exam  Right Left   Lids/Lashes dermatochalasis; mild MGD dermatochalasis; mild MGD   Conjunctiva/Sclera White and quiet tr-1+ injection; +mucus at caruncle and inferior palp conj   Cornea clear; well healed cat wound 1+ PEE; clear; well healed cat wound   Anterior Chamber 1/2+ cell/pigment 1+ cell/pigment; no hypopyon   Iris Round, dilated Round, dilated   Lens Posterior chamber intraocular lens Posterior chamber intraocular lens   Vitreous Vitreous syneresis Vitreous syneresis       Fundus Exam      Right Left   Disc Normal Normal   C/D Ratio 0.3 0.45   Macula flat; blunted foveal reflex flat; blunted foveal reflex   Vessels attenuated attenuated; +AV crossing changes   Periphery attached attached          IMAGING AND PROCEDURES  Imaging and Procedures for 06/04/18           ASSESSMENT/PLAN:    ICD-10-CM   1. Conjunctivitis of left eye, unspecified conjunctivitis type H10.9   2. Pseudophakia of both eyes Z96.1     1. Conjunctivitis OS - mild injection with mucus accumulation - etiology likely combination of post op inflammation - no hypopyon or endophthalmitis - [facility] [place] 20/20 OU - currently on Durezol TID OS per Dr. Gwynn Burly - recommend Besivance QID OS - sample bottle given to start - f/u w/ Dr. Gwynn Burly as scheduled on Monday - f/u here prn  2. Pseudophakia OU  - s/p CE/IOL OU  - OD 03/2018, OS 04/29/2018 by Dr. Talbert Forest  - beautiful surgeries  - monitor   Ophthalmic Meds Ordered this visit:  Meds ordered this encounter  Medications  . Besifloxacin HCl 0.6 % SUSP    Sig: Place 1 drop into the left eye 4 (four) times daily.    Dispense:  5 mL    Refill:  0       Return if symptoms worsen or fail to improve.  There are no Patient Instructions on file for this  visit.   Explained the diagnoses, plan, and follow up with the patient and they expressed understanding.  Patient expressed understanding of the importance of proper follow up care.   Gardiner Sleeper, M.D., Ph.D. Diseases & Surgery of the Retina and Vitreous [facility] 06/04/18     Abbreviations: M myopia (nearsighted); A astigmatism; H hyperopia (farsighted); P presbyopia; Mrx spectacle prescription;  CTL contact lenses; OD right eye; OS left eye; OU both eyes  XT exotropia; ET esotropia; PEK punctate epithelial keratitis; PEE punctate epithelial erosions; DES dry eye syndrome; MGD meibomian gland dysfunction; ATs artificial tears; PFAT's preservative free artificial tears; [place] nuclear sclerotic cataract; PSC posterior subcapsular cataract; ERM epi-retinal membrane; PVD posterior vitreous detachment; RD retinal detachment; DM diabetes mellitus; DR diabetic retinopathy; NPDR non-proliferative diabetic retinopathy; PDR proliferative diabetic retinopathy; CSME clinically significant macular edema; DME diabetic macular edema; dbh dot blot hemorrhages; CWS cotton wool spot; POAG primary open angle glaucoma; C/D cup-to-disc ratio; HVF humphrey visual field; GVF goldmann visual field; OCT optical coherence tomography; IOP intraocular pressure; BRVO Branch retinal vein occlusion; CRVO central retinal vein occlusion; CRAO central retinal artery occlusion; BRAO branch retinal artery occlusion; RT retinal tear; SB scleral buckle; PPV pars plana vitrectomy; VH Vitreous hemorrhage; PRP panretinal laser photocoagulation; IVK intravitreal kenalog; VMT vitreomacular traction; MH Macular hole;  NVD neovascularization of the disc; NVE neovascularization elsewhere; AREDS age related eye disease study; ARMD age related macular degeneration; POAG primary open angle glaucoma; EBMD epithelial/anterior basement membrane dystrophy;  ACIOL anterior chamber intraocular lens; IOL intraocular lens; PCIOL  posterior chamber intraocular lens; Phaco/IOL phacoemulsification with intraocular lens placement; [place] photorefractive keratectomy; LASIK laser assisted in situ keratomileusis; HTN hypertension; DM diabetes mellitus; COPD chronic obstructive pulmonary disease

## 2018-09-15 ENCOUNTER — Other Ambulatory Visit: Payer: Self-pay | Admitting: Cardiovascular Disease

## 2018-09-22 ENCOUNTER — Other Ambulatory Visit: Payer: Self-pay

## 2018-09-22 ENCOUNTER — Ambulatory Visit (INDEPENDENT_AMBULATORY_CARE_PROVIDER_SITE_OTHER): Payer: Medicare Other

## 2018-09-22 ENCOUNTER — Encounter: Payer: Self-pay | Admitting: Internal Medicine

## 2018-09-22 ENCOUNTER — Telehealth: Payer: Self-pay

## 2018-09-22 ENCOUNTER — Ambulatory Visit (INDEPENDENT_AMBULATORY_CARE_PROVIDER_SITE_OTHER): Payer: Medicare Other | Admitting: Internal Medicine

## 2018-09-22 VITALS — BP 128/60 | HR 83 | Temp 98.0°F | Ht 64.0 in | Wt 195.4 lb

## 2018-09-22 DIAGNOSIS — R0600 Dyspnea, unspecified: Secondary | ICD-10-CM

## 2018-09-22 DIAGNOSIS — F5101 Primary insomnia: Secondary | ICD-10-CM | POA: Diagnosis not present

## 2018-09-22 DIAGNOSIS — G4733 Obstructive sleep apnea (adult) (pediatric): Secondary | ICD-10-CM | POA: Diagnosis not present

## 2018-09-22 DIAGNOSIS — Z7722 Contact with and (suspected) exposure to environmental tobacco smoke (acute) (chronic): Secondary | ICD-10-CM

## 2018-09-22 MED ORDER — ZOLPIDEM TARTRATE 5 MG PO TABS: ORAL_TABLET | ORAL | 5 refills | Status: DC

## 2018-09-22 NOTE — Assessment & Plan Note (Signed)
Dominant issue now is her concern for her husband's health. She wants to be able to listen out for him. She wasn't successful with Trazodone, Belsomra or Ativan. Might be a candidate for cognitive behavioral therapy. Plan- try ambien 5 mg now. Anxiolytic and sedative effects might work well for her.

## 2018-09-22 NOTE — Patient Instructions (Signed)
Order- CXR   Dx tobacco smoke exposure             Schedule PFT  Order- referral to Orthodontist Dr Ron Parker      Consider oral appliance for OSA  Script sent for Ambien 5 mg    Try at bedtime for sleep

## 2018-09-22 NOTE — Assessment & Plan Note (Signed)
Restlessness and insomnia are interfering with CPAP uses. It works well when she stays in bed and keeps mask on.We discussed alternatives and she was receptive to learning about oral appliance as alternative. Plan- referred to Dr Ron Parker to consider oral appliance for OSA

## 2018-09-22 NOTE — Telephone Encounter (Signed)
PA Initiated for Ambien 5mg  (Key: [id number]) Denied. Alternatives include: Belsomra and Rozerem.   CY please advise. Please send response to triage. Thanks.

## 2018-09-22 NOTE — Progress Notes (Signed)
HPI female never smoker followed for OSA, insomnia, complicated by allergic rhinitis, HBP, GERD NPSG-12/18/2016-AHI 10.5/hour, desaturation to 85%, mean saturation 89.5%, periodic limb movement  --------------------------------------------------------------------------------------  03/24/2018-73 year old female never smoker followed for OSA, insomnia, complicated by allergic rhinitis, HBP, GERD,  -----OSA-CPAP gong well Belsomra 15 mg CPAP auto 5-20/[employer] Download 93% compliance AHI 2.2/hour She says CPAP is "now my best friend".  She sleeps well with it and denies concerns.  We reviewed download. Nonspecific insomnia remains an issue.  She was not impressed with how Belsomra worked.  She has been on the Internet looking at options I would like to try trazodone, which is cheaper-discussed. She does note occasional reflux and is being treated for it.  09/22/2018- 73 year old female never smoker followed for OSA, insomnia, complicated by allergic rhinitis, HBP, GERD,  Belsomra 15 mg CPAP auto 5-20/[employer] Download 17% compliance, AHI 1.1/ hr -----OSA on CPAP 8-12 // DME: [employer], pt states she has not been using her machine frequently d/t trouble sleeping Body weight today 195 lbs Trazodone 50, Lorazepam 1 mg,  Failed Belsomra. And Trazodone 50. Says Lorazepam has little effect. She is up and down a lot at night, restless and anxious especially now because husband being treated for cancer.  Notes a little wheeze as she lies down. She describes significant tobacco smoke exposure when she worked at [employer]. Expresses concerns about her breathing and would like evaluation.  Husband tells her she snores some.without CPAP.   ROS-see HPI   + = positive Constitutional:    weight loss, night sweats, fevers, chills, fatigue, lassitude. HEENT:    headaches, difficulty swallowing, tooth/dental problems, sore throat, + tinnitus      sneezing, itching, ear ache, nasal congestion, post nasal drip,  snoring CV:    chest pain, orthopnea, PND, swelling in lower extremities, anasarca,                                  dizziness, palpitations Resp:   +shortness of breath with exertion or at rest.                productive cough,   non-productive cough, coughing up of blood.              change in color of mucus.  +wheezing.   Skin:    rash or lesions. GI:    +heartburn, +indigestion, abdominal pain, nausea, vomiting, diarrhea,                 change in bowel habits, loss of appetite GU: dysuria, change in color of urine, no urgency or frequency.   flank pain. MS:   joint pain, stiffness, decreased range of motion, back pain. Neuro-     nothing unusual Psych:  change in mood or affect.  depression or+ anxiety.   memory loss.  OBJ- Physical Exam General- Alert, Oriented, Affect-appropriate, Distress- none acute, + overweight Skin- rash-none, lesions- none, excoriation- none Lymphadenopathy- none Head- atraumatic            Eyes- Gross vision intact, PERRLA, conjunctivae and secretions clear            Ears- Hearing, canals-normal            Nose- Clear, no-Septal dev, mucus, polyps, erosion, perforation             Throat- Mallampati III , mucosa clear , drainage- none, tonsils- atrophic Neck- flexible , trachea midline,  no stridor , thyroid nl, carotid no bruit Chest - symmetrical excursion , unlabored           Heart/CV- RRR , no murmur , no gallop  , no rub, nl s1 s2                           - JVD- none , edema- none, stasis changes- none, varices- none           Lung- +few crackles, wheeze- none, cough- none , dullness-none, rub- none           Chest wall-  Abd-  Br/ Gen/ Rectal- Not done, not indicated Extrem- cyanosis- none, clubbing, none, atrophy- none, strength- nl Neuro- grossly intact to observation

## 2018-09-22 NOTE — Assessment & Plan Note (Signed)
Clearly worried about husband's cancer and focused on her history of second hand exposure. More aware of chest congestion, dyspnea. Plan- CXR, PFT

## 2018-09-22 NOTE — Telephone Encounter (Signed)
She has previously failed Belsomra. Ok to try for PA.

## 2018-09-23 NOTE — Telephone Encounter (Signed)
Due to the previous PA being cancelled, I have initiated a new PA  Medication name and strength: ambien 5mg  Provider: [place]: [facility] Patient insurance ID: [id number] Phone: [phone] Fax: [phone]  Was the PA started on CMM?  yes If yes, please enter the Key: [id number] Timeframe for approval/denial: [employer] is reviewing your PA request. Typically an electronic response will be received within 72 hours. To check for an update later, open this request from your dashboard.  Routing to [employer] to follow up on.

## 2018-09-27 NOTE — Telephone Encounter (Signed)
Denied on July 30 Request Reference Number: [id number].  AMBIEN TAB 5MG  is denied for not meeting the prior authorization requirement(s). For further questions, call [phone]. Appeals are not supported through [place]. Please refer to the fax case notice for appeals information and instructions.  Routing to pharmacy team for follow-up. Pharmacy team, please advise. Thank you.

## 2018-10-04 MED ORDER — RAMELTEON 8 MG PO TABS: 8.0000 mg | ORAL_TABLET | Freq: Every day | ORAL | 5 refills | Status: DC

## 2018-10-04 NOTE — Telephone Encounter (Signed)
Please tell her her insurance is requiring that other meds be tried, instead of Ambien.  Offer Rozerem 8 mg, # 30, 1 at bedtime for sleep, ref x 5

## 2018-10-04 NOTE — Telephone Encounter (Signed)
Called and spoke w/ patient regarding the denial or Ambien and CY's recommendations to try Rozerem 8 mg. Pt verbalized understanding and agreed to these measures. I have verified pt's preferred pharmacy and sent the order for Rozerem 8 mg, #30, 1 at bedtime for sleep x5 refills. Nothing further needed at this time.

## 2018-10-05 ENCOUNTER — Other Ambulatory Visit: Payer: Self-pay | Admitting: Internal Medicine

## 2018-10-14 ENCOUNTER — Other Ambulatory Visit: Payer: Self-pay | Admitting: Cardiovascular Disease

## 2018-12-08 ENCOUNTER — Other Ambulatory Visit: Payer: Self-pay | Admitting: Cardiovascular Disease

## 2018-12-27 ENCOUNTER — Other Ambulatory Visit: Payer: Self-pay | Admitting: Internal Medicine

## 2019-01-24 ENCOUNTER — Ambulatory Visit: Payer: Medicare Other | Admitting: Internal Medicine

## 2019-01-27 ENCOUNTER — Ambulatory Visit: Payer: Medicare Other | Admitting: Internal Medicine

## 2019-02-10 ENCOUNTER — Encounter: Payer: Self-pay | Admitting: Cardiovascular Disease

## 2019-02-10 ENCOUNTER — Other Ambulatory Visit: Payer: Self-pay

## 2019-02-10 ENCOUNTER — Ambulatory Visit (INDEPENDENT_AMBULATORY_CARE_PROVIDER_SITE_OTHER): Payer: Medicare Other | Admitting: Cardiovascular Disease

## 2019-02-10 VITALS — BP 148/84 | HR 74 | Ht 64.0 in | Wt 190.8 lb

## 2019-02-10 DIAGNOSIS — I517 Cardiomegaly: Secondary | ICD-10-CM | POA: Diagnosis not present

## 2019-02-10 DIAGNOSIS — R072 Precordial pain: Secondary | ICD-10-CM | POA: Diagnosis not present

## 2019-02-10 MED ORDER — METOPROLOL SUCCINATE ER 25 MG PO TB24: 25.0000 mg | ORAL_TABLET | Freq: Every day | ORAL | 3 refills | Status: DC

## 2019-02-10 NOTE — Progress Notes (Signed)
Chief Complaint  Patient presents with  . Follow-up    chest pain   History of Present Illness: 73 yo female with history of GERD, HTN and HLD who is here today for cardiac follow up. She has been seen remotely by Dr. Fransico Him and reports a normal stress test at that time in 2009. I met her in September 2018 during an office visit for evaluation of chest pain. She has never smoked. Echo September 2018 with normal LV systolic function, ERDE=08-14%, LVH with small LVOT gradient, no SAM. Cardiac cath 11/12/16 with no evidence of CAD and normal filling pressures.   He is here today for follow up. The patient denies any chest pain, dyspnea, palpitations, lower extremity edema, orthopnea, PND, dizziness, near syncope or syncope. Blood pressure has been well controlled at home.   Primary Care Provider: Selinda Orion   Past Medical History:  Diagnosis Date  . Allergic rhinitis, cause unspecified   . Arthritis   . Cancer ([place])    hx skin cancer  . Depression   . Esophageal stenosis   . GERD (gastroesophageal reflux disease)   . Hiatal hernia   . History of bronchitis as a child   . History of nonmelanoma skin cancer   . History of urinary tract infection   . Hyperlipidemia   . Leg fracture, right    2015  . Multiple falls   . Pneumonia    2015  . Unspecified essential hypertension    PT STOPPED BP MED WITH PCP KNOWLEDGE  . Wears glasses     Past Surgical History:  Procedure Laterality Date  . APPENDECTOMY  1984  . BLADDER SUSPENSION  SEVERAL YRS AGO  . HAND SURGERY     trigger finger release bilat   . HARDWARE REMOVAL Right 04/19/2014   Procedure: HARDWARE REMOVAL FROM RIGHT TIBIA;  Surgeon: [name], MD;  Location: [facility];  Service: Orthopedics;  Laterality: Right;  . LAPAROSCOPIC CHOLECYSTECTOMY  04/2004  . laporoscopy  1977   ovarian cyst  . LATERAL COLLATERAL LIGAMENT REPAIR, KNEE     RT KNEE  . LEFT HEART CATH AND CORONARY ANGIOGRAPHY N/A 11/12/2016    Procedure: LEFT HEART CATH AND CORONARY ANGIOGRAPHY;  Surgeon: Burnell Blanks, MD;  Location: [facility];  Service: Cardiovascular;  Laterality: N/A;  . LUMBAR LAMINECTOMY/DECOMPRESSION MICRODISCECTOMY N/A 03/07/2015   Procedure: COMPLETE DECOMPRESSION/LUMBAR LAMINECTOMY L4 - L5 FOR STENOSIS 1 LEVEL;  Surgeon: Latanya Maudlin, MD;  Location: [facility];  Service: Orthopedics;  Laterality: N/A;  . ORIF TIBIA PLATEAU Right 12/26/2013   Procedure: OPEN REDUCTION INTERNAL FIXATION (ORIF) TIBIAL PLATEAU;  Surgeon: [name], MD;  Location: [facility];  Service: Orthopedics;  Laterality: Right;  . TOTAL ANKLE REPLACEMENT     7 years ago; left   . TOTAL KNEE ARTHROPLASTY Right 08/09/2014   Procedure: RIGHT TOTAL KNEE ARTHROPLASTY;  Surgeon: Gaynelle Arabian, MD;  Location: [facility];  Service: Orthopedics;  Laterality: Right;  . TUBAL LIGATION     75 reversal in 81 and tubal in 85    Current Outpatient Medications  Medication Sig Dispense Refill  . amLODipine (NORVASC) 10 MG tablet Take 10 mg by mouth every morning.    Marland Kitchen aspirin EC 81 MG tablet Take 81 mg by mouth daily.    . calcium citrate-vitamin D (CITRACAL+D) 315-200 MG-UNIT per tablet Take 1 tablet by mouth every morning.    . Cholecalciferol (VITAMIN D3) 400 units CAPS Take  400 Units by mouth daily.    . DULoxetine (CYMBALTA) 30 MG capsule Take 60 mg by mouth daily.    . metoprolol succinate (TOPROL-XL) 25 MG 24 hr tablet Take 1 tablet (25 mg total) by mouth daily. 90 tablet 3  . Multiple Vitamin (MULTIVITAMIN WITH MINERALS) TABS tablet Take 1 tablet by mouth daily.    Marland Kitchen omeprazole (PRILOSEC) 40 MG capsule Take 40 mg by mouth daily as needed (for acid reflux.).    Marland Kitchen vitamin E 400 UNIT capsule Take 400 Units by mouth daily.    Marland Kitchen VYTORIN 10-40 MG per tablet Take 1 tablet by mouth at bedtime.  3   No current facility-administered medications for this visit.    No Known Allergies  Social History   Socioeconomic History  . Marital  status: Married    Spouse name: Not on file  . Number of children: 1  . Years of education: Not on file  . Highest education level: Not on file  Occupational History  . Occupation: Retired    Comment: Event organiser  Tobacco Use  . Smoking status: Never Smoker  . Smokeless tobacco: Never Used  Substance and Sexual Activity  . Alcohol use: Yes    Alcohol/week: 3.0 standard drinks    Types: 3 Cans of beer per week    Comment: OCCASIONAL  . Drug use: No  . Sexual activity: Yes    Birth control/protection: None    Comment: not asked  Other Topics Concern  . Not on file  Social History Narrative  . Not on file   Social Determinants of Health   Financial Resource Strain:   . Difficulty of Paying Living Expenses: Not on file  Food Insecurity:   . Worried About Charity fundraiser in the Last Year: Not on file  . Ran Out of Food in the Last Year: Not on file  Transportation Needs:   . Lack of Transportation (Medical): Not on file  . Lack of Transportation (Non-Medical): Not on file  Physical Activity:   . Days of Exercise per Week: Not on file  . Minutes of Exercise per Session: Not on file  Stress:   . Feeling of Stress : Not on file  Social Connections:   . Frequency of Communication with Friends and Family: Not on file  . Frequency of Social Gatherings with Friends and Family: Not on file  . Attends Religious Services: Not on file  . Active Member of Clubs or Organizations: Not on file  . Attends Archivist Meetings: Not on file  . Marital Status: Not on file  Intimate Partner Violence:   . Fear of Current or Ex-Partner: Not on file  . Emotionally Abused: Not on file  . Physically Abused: Not on file  . Sexually Abused: Not on file    Family History  Problem Relation Age of Onset  . Stomach cancer Brother 28       question if started pancreatic   . Diabetes Sister   . Heart disease Brother   . Colon cancer Neg Hx     Review of Systems:  As stated  in the HPI and otherwise negative.   BP (!) 148/84   Pulse 74   Ht '5\' 4"'$  (1.626 m)   Wt 190 lb 12.8 oz (86.5 kg)   SpO2 98%   BMI 32.75 kg/m   Physical Examination:  General: Well developed, well nourished, NAD  HEENT: OP clear, mucus membranes moist  SKIN: warm,  dry. No rashes. Neuro: No focal deficits  Musculoskeletal: Muscle strength 5/5 all ext  Psychiatric: Mood and affect normal  Neck: No JVD, no carotid bruits, no thyromegaly, no lymphadenopathy.  Lungs:Clear bilaterally, no wheezes, rhonci, crackles Cardiovascular: Regular rate and rhythm. No murmurs, gallops or rubs. Abdomen:Soft. Bowel sounds present. Non-tender.  Extremities: No lower extremity edema. Pulses are 2 + in the bilateral DP/PT.  Echo 11/06/16: - Left ventricle: There appears to be a small LVOT gradient   although SAM is not appreciated due to poor image quality. The   cavity size was normal. Wall thickness was increased in a pattern   of moderate LVH. Systolic function was normal. The estimated   ejection fraction was in the range of 60% to 65%. There was   dynamic obstruction at restin the outflow tract, with mid-cavity   obliteration, a peak velocity of 288 cm/sec, and a peak gradient   of 33 mm Hg. Wall motion was normal; there were no regional wall   motion abnormalities. Doppler parameters are consistent with   abnormal left ventricular relaxation (grade 1 diastolic   dysfunction). - Right ventricle: The cavity size was mildly dilated. Wall   thickness was normal. - Atrial septum: No defect or patent foramen ovale was identified.  EKG:  EKG is ordered today and shows sinus rhythm with RBBB  Recent Labs: No results found for requested labs within last 8760 hours.   Lipid Panel    Wt Readings from Last 3 Encounters:  02/10/19 190 lb 12.8 oz (86.5 kg)  09/22/18 195 lb 6.4 oz (88.6 kg)  03/24/18 193 lb 9.6 oz (87.8 kg)     Other studies Reviewed: Additional studies/ records that were  reviewed today include:  Review of the above records demonstrates:   Assessment and Plan:   1. Chest pain: No evidence of CAD by cath 2018. No recent chest pain  2. LVH: No syncope or dizziness. She has a LVOT gradient but no SAM. Continue Toprol   Current medicines are reviewed at length with the patient today.  The patient does not have concerns regarding medicines.  The following changes have been made:  no change  Labs/ tests ordered today include:  No orders of the defined types were placed in this encounter.   Disposition:   FU with me in 12 months    Signed, Lauree Chandler, MD 02/10/2019 4:01 PM    [facility] [place], [place], [place]  [address] Phone: [phone]; Fax: [phone]

## 2019-02-10 NOTE — Patient Instructions (Signed)

## 2019-02-11 NOTE — Addendum Note (Signed)
Addended by: Rose Phi on: 02/11/2019 03:15 PM   Modules accepted: Orders

## 2019-03-18 ENCOUNTER — Other Ambulatory Visit: Payer: Self-pay | Admitting: Physician Assistant

## 2019-03-18 DIAGNOSIS — Z800 Family history of malignant neoplasm of digestive organs: Secondary | ICD-10-CM

## 2019-03-18 DIAGNOSIS — R1012 Left upper quadrant pain: Secondary | ICD-10-CM

## 2019-03-22 ENCOUNTER — Ambulatory Visit
Admission: RE | Admit: 2019-03-22 | Discharge: 2019-03-22 | Disposition: A | Discharge status: Home / Self Care | Payer: Medicare Other | Source: Ambulatory Visit | Attending: Physician Assistant | Admitting: Physician Assistant

## 2019-03-22 ENCOUNTER — Other Ambulatory Visit: Payer: Self-pay | Admitting: Internal Medicine

## 2019-03-22 DIAGNOSIS — Z800 Family history of malignant neoplasm of digestive organs: Secondary | ICD-10-CM

## 2019-03-22 DIAGNOSIS — R1012 Left upper quadrant pain: Secondary | ICD-10-CM

## 2019-09-16 ENCOUNTER — Emergency Department (HOSPITAL_COMMUNITY)
Admission: EM | Admit: 2019-09-16 | Discharge: 2019-09-16 | Disposition: A | Discharge status: Home / Self Care | Payer: Medicare Other | Attending: Emergency Medicine | Admitting: Emergency Medicine

## 2019-09-16 ENCOUNTER — Emergency Department (HOSPITAL_COMMUNITY): Payer: Medicare Other

## 2019-09-16 ENCOUNTER — Other Ambulatory Visit: Payer: Self-pay

## 2019-09-16 DIAGNOSIS — Z79899 Other long term (current) drug therapy: Secondary | ICD-10-CM | POA: Diagnosis not present

## 2019-09-16 DIAGNOSIS — Y939 Activity, unspecified: Secondary | ICD-10-CM | POA: Diagnosis not present

## 2019-09-16 DIAGNOSIS — S0083XA Contusion of other part of head, initial encounter: Secondary | ICD-10-CM | POA: Diagnosis not present

## 2019-09-16 DIAGNOSIS — Z96661 Presence of right artificial ankle joint: Secondary | ICD-10-CM | POA: Insufficient documentation

## 2019-09-16 DIAGNOSIS — W208XXA Other cause of strike by thrown, projected or falling object, initial encounter: Secondary | ICD-10-CM | POA: Diagnosis not present

## 2019-09-16 DIAGNOSIS — Z7982 Long term (current) use of aspirin: Secondary | ICD-10-CM | POA: Diagnosis not present

## 2019-09-16 DIAGNOSIS — I10 Essential (primary) hypertension: Secondary | ICD-10-CM | POA: Insufficient documentation

## 2019-09-16 DIAGNOSIS — S0990XA Unspecified injury of head, initial encounter: Secondary | ICD-10-CM | POA: Diagnosis present

## 2019-09-16 DIAGNOSIS — Y929 Unspecified place or not applicable: Secondary | ICD-10-CM | POA: Diagnosis not present

## 2019-09-16 DIAGNOSIS — Y999 Unspecified external cause status: Secondary | ICD-10-CM | POA: Diagnosis not present

## 2019-09-16 LAB — BASIC METABOLIC PANEL
Anion gap: 14 (ref 5–15)
BUN: 19 mg/dL (ref 8–23)
CO2: 23 mmol/L (ref 22–32)
Calcium: 10.1 mg/dL (ref 8.9–10.3)
Chloride: 102 mmol/L (ref 98–111)
Creatinine, Ser: 1.08 mg/dL — ABNORMAL HIGH (ref 0.44–1.00)
GFR calc Af Amer: 59 mL/min — ABNORMAL LOW (ref >60)
GFR calc non Af Amer: 51 mL/min — ABNORMAL LOW (ref >60)
Glucose, Bld: 92 mg/dL (ref 70–99)
Potassium: 4.2 mmol/L (ref 3.5–5.1)
Sodium: 139 mmol/L (ref 135–145)

## 2019-09-16 LAB — CBC
HCT: 43.2 % (ref 36.0–46.0)
Hemoglobin: 15 g/dL (ref 12.0–15.0)
MCH: 32.4 pg (ref 26.0–34.0)
MCHC: 34.7 g/dL (ref 30.0–36.0)
MCV: 93.3 fL (ref 80.0–100.0)
Platelets: 259 10*3/uL (ref 150–400)
RBC: 4.63 MIL/uL (ref 3.87–5.11)
RDW: 12.1 % (ref 11.5–15.5)
WBC: 11 10*3/uL — ABNORMAL HIGH (ref 4.0–10.5)
nRBC: 0 % (ref 0.0–0.2)

## 2019-09-16 MED ORDER — SODIUM CHLORIDE 0.9% FLUSH: 3.0000 mL | Freq: Once | INTRAVENOUS | Status: DC

## 2019-09-16 NOTE — ED Notes (Signed)
Pt verbalizes understanding of DC instructions. Pt belongings returned and is ambulatory out of ED.  
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
## 2019-11-15 ENCOUNTER — Other Ambulatory Visit: Payer: Self-pay

## 2019-11-15 ENCOUNTER — Other Ambulatory Visit (HOSPITAL_COMMUNITY): Payer: Self-pay | Admitting: Orthopedic Surgery

## 2019-11-15 ENCOUNTER — Ambulatory Visit (HOSPITAL_COMMUNITY)
Admission: RE | Admit: 2019-11-15 | Discharge: 2019-11-15 | Disposition: A | Discharge status: Home / Self Care | Payer: Medicare Other | Source: Ambulatory Visit | Attending: Orthopedic Surgery | Admitting: Orthopedic Surgery

## 2019-11-15 DIAGNOSIS — M79662 Pain in left lower leg: Secondary | ICD-10-CM | POA: Diagnosis not present

## 2019-12-30 ENCOUNTER — Encounter (HOSPITAL_COMMUNITY): Payer: Self-pay | Admitting: Emergency Medicine

## 2019-12-30 ENCOUNTER — Emergency Department (HOSPITAL_COMMUNITY): Payer: Medicare Other

## 2019-12-30 ENCOUNTER — Other Ambulatory Visit: Payer: Self-pay

## 2019-12-30 ENCOUNTER — Emergency Department (HOSPITAL_COMMUNITY)
Admission: EM | Admit: 2019-12-30 | Discharge: 2019-12-30 | Disposition: A | Discharge status: Home / Self Care | Payer: Medicare Other | Attending: Emergency Medicine | Admitting: Emergency Medicine

## 2019-12-30 DIAGNOSIS — Z7982 Long term (current) use of aspirin: Secondary | ICD-10-CM | POA: Insufficient documentation

## 2019-12-30 DIAGNOSIS — Z96651 Presence of right artificial knee joint: Secondary | ICD-10-CM | POA: Insufficient documentation

## 2019-12-30 DIAGNOSIS — I10 Essential (primary) hypertension: Secondary | ICD-10-CM | POA: Insufficient documentation

## 2019-12-30 DIAGNOSIS — Z79899 Other long term (current) drug therapy: Secondary | ICD-10-CM | POA: Insufficient documentation

## 2019-12-30 DIAGNOSIS — M5416 Radiculopathy, lumbar region: Secondary | ICD-10-CM | POA: Insufficient documentation

## 2019-12-30 DIAGNOSIS — Z85828 Personal history of other malignant neoplasm of skin: Secondary | ICD-10-CM | POA: Diagnosis not present

## 2019-12-30 DIAGNOSIS — M545 Low back pain, unspecified: Secondary | ICD-10-CM | POA: Diagnosis present

## 2019-12-30 NOTE — ED Triage Notes (Signed)
Patient c/o left knee pain intermittent for months and worse today. Denies new injury.

## 2019-12-30 NOTE — ED Provider Notes (Signed)
[facility] DEPT Provider Note   CSN: [id number] Arrival date & time: 12/30/19  1936     History Chief Complaint  Patient presents with  . Knee Pain    [name] is a 74 y.o. 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  Pain is located to the left lower back radiating down the posterior thigh into the posterior knee.  Pain does not pass the knee.  Pain worsened 1 week ago without aggravating factor.  She states she was at a funeral today and fell due to pain and had difficulty getting back up. No head trauma.  She is followed by orthopedics, gets regular steroid injections to her back and knee, last occurrence was about 2 months ago with minimal relief.  Denies numbness or weakness in extremities, only pain.  Denies new bowel or bladder incontinence, saddle paresthesias.  Hx of arthritis, multiple joint replacements.   The history is provided by the patient.       Past Medical History:  Diagnosis Date  . Allergic rhinitis, cause unspecified   . Arthritis   . Cancer ([place])    hx skin cancer  . Depression   . Esophageal stenosis   . GERD (gastroesophageal reflux disease)   . Hiatal hernia   . History of bronchitis as a child   . History of nonmelanoma skin cancer   . History of urinary tract infection   . Hyperlipidemia   . Leg fracture, right    2015  . Multiple falls   . Pneumonia    2015  . Unspecified essential hypertension    PT STOPPED BP MED WITH PCP KNOWLEDGE  . Wears glasses     Patient Active Problem List   Diagnosis Date Noted  . Dyspnea 09/22/2018  . Obstructive sleep apnea 01/15/2018  . Precordial pain   . 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 OA (osteoarthritis) of knee 08/09/2014  . UTI (urinary tract infection) 12/25/2013  . Fever  12/23/2013  . Hospital acquired PNA 12/23/2013  . Tibial plateau fracture 12/21/2013  . Insomnia 06/14/2010  . HYPERCHOLESTEROLEMIA 06/02/2007  . Essential hypertension 06/02/2007  . ALLERGIC RHINITIS 06/02/2007  . GASTROESOPHAGEAL REFLUX DISEASE 06/02/2007  . HIATAL HERNIA 06/02/2007  . EPIGASTRIC PAIN 06/02/2007  . GASTRITIS 05/01/2004    Past Surgical History:  Procedure Laterality Date  . APPENDECTOMY  1984  . BLADDER SUSPENSION  SEVERAL YRS AGO  . HAND SURGERY     trigger finger release bilat   . HARDWARE REMOVAL Right 04/19/2014   Procedure: HARDWARE REMOVAL FROM RIGHT TIBIA;  Surgeon: [name], MD;  Location: [facility];  Service: Orthopedics;  Laterality: Right;  . LAPAROSCOPIC CHOLECYSTECTOMY  04/2004  . laporoscopy  1977   ovarian cyst  . LATERAL COLLATERAL LIGAMENT REPAIR, KNEE     RT KNEE  . LEFT HEART CATH AND CORONARY ANGIOGRAPHY N/A 11/12/2016   Procedure: LEFT HEART CATH AND CORONARY ANGIOGRAPHY;  Surgeon: Burnell Blanks, MD;  Location: [facility];  Service: Cardiovascular;  Laterality: N/A;  . LUMBAR LAMINECTOMY/DECOMPRESSION MICRODISCECTOMY N/A 03/07/2015   Procedure: COMPLETE DECOMPRESSION/LUMBAR LAMINECTOMY L4 - L5 FOR STENOSIS 1 LEVEL;  Surgeon: Latanya Maudlin, MD;  Location: [facility];  Service: Orthopedics;  Laterality: N/A;  . ORIF TIBIA PLATEAU Right 12/26/2013   Procedure: OPEN REDUCTION INTERNAL FIXATION (ORIF) TIBIAL PLATEAU;  Surgeon: [name], MD;  Location: [facility];  Service: Orthopedics;  Laterality: Right;  . TOTAL ANKLE REPLACEMENT     7 years ago; left   . TOTAL KNEE ARTHROPLASTY Right 08/09/2014   Procedure: RIGHT TOTAL KNEE ARTHROPLASTY;  Surgeon: Gaynelle Arabian, MD;  Location: [facility];  Service: Orthopedics;  Laterality: Right;  . TUBAL LIGATION     75 reversal in 81 and tubal in 85     OB History   No obstetric history on file.     Family History  Problem Relation Age of Onset  . Stomach cancer Brother 2        question if started pancreatic   . Diabetes Sister   . Heart disease Brother   . Colon cancer Neg Hx     Social History   Tobacco Use  . Smoking status: Never Smoker  . Smokeless tobacco: Never Used  Vaping Use  . Vaping Use: Never used  Substance Use Topics  . Alcohol use: Yes    Alcohol/week: 3.0 standard drinks    Types: 3 Cans of beer per week    Comment: OCCASIONAL  . Drug use: No    Home Medications Prior to Admission medications   Medication Sig Start Date End Date Taking? Authorizing Provider  amLODipine (NORVASC) 10 MG tablet Take 10 mg by mouth every morning.    [provider]  aspirin EC 81 MG tablet Take 81 mg by mouth daily.    [provider]  calcium citrate-vitamin D (CITRACAL+D) 315-200 MG-UNIT per tablet Take 1 tablet by mouth every morning.    [provider]  Cholecalciferol (VITAMIN D3) 400 units CAPS Take 400 Units by mouth daily.    [provider]  DULoxetine (CYMBALTA) 30 MG capsule Take 60 mg by mouth daily. 10/24/14   [provider]  metoprolol succinate (TOPROL-XL) 25 MG 24 hr tablet Take 1 tablet (25 mg total) by mouth daily. 02/10/19   Burnell Blanks, MD  Multiple Vitamin (MULTIVITAMIN WITH MINERALS) TABS tablet Take 1 tablet by mouth daily.    [provider]  omeprazole (PRILOSEC) 40 MG capsule Take 40 mg by mouth daily as needed (for acid reflux.).    [provider]  vitamin E 400 UNIT capsule Take 400 Units by mouth daily.    [provider]  VYTORIN 10-40 MG per tablet Take 1 tablet by mouth at bedtime. 06/20/14   [provider]    Allergies    Patient has no known allergies.  Review of Systems   Review of Systems  Musculoskeletal: Positive for back pain and myalgias.  All other systems reviewed and are negative.   Physical Exam Updated Vital Signs BP 139/64   Pulse 84   Temp 99.2 F (37.3 C) (Oral)   Resp 18   Ht 5\' 4"  (1.626 m)   Wt 81.6  kg   SpO2 91%   BMI 30.90 kg/m   Physical Exam Vitals and nursing note reviewed.  Constitutional:      General: She is not in acute distress.    Appearance: She is well-developed.  HENT:     Head: Normocephalic and atraumatic.  Eyes:     Conjunctiva/sclera: Conjunctivae normal.  Cardiovascular:     Rate and Rhythm: Normal rate and regular rhythm.  Pulmonary:     Effort: Pulmonary effort is normal. No respiratory distress.     Breath sounds: Normal breath sounds.  Abdominal:     Palpations: Abdomen  is soft.  Musculoskeletal:     Comments: TTP to lower midline sacral region and left upper gluteal region.  No tenderness to the thigh or knee.  Positive straight leg raise.  Skin changes.  Right knee with very minimal swelling, no tenderness, redness, or warmth.  Skin:    General: Skin is warm.  Neurological:     Mental Status: She is alert.     Comments: Normal tone.  5/5 strength in BLE including strong and equal dorsiflexion/plantar flexion Sensory:  light touch normal in BLE extremities.   Gait: Patient willing to ambulate secondary to pain CV: distal pulses palpable throughout    Psychiatric:        Behavior: Behavior normal.     ED Results / Procedures / Treatments   Labs (all labs ordered are listed, but only abnormal results are displayed) Labs Reviewed - No data to display  EKG None  Radiology DG Knee Complete 4 Views Left  Result Date: 12/30/2019 CLINICAL DATA:  Pain EXAM: LEFT KNEE - COMPLETE 4+ VIEW COMPARISON:  None. FINDINGS: No evidence of fracture, dislocation,. There is a small knee joint effusion. No evidence of arthropathy or other focal bone abnormality. Scattered vascular calcifications are noted. IMPRESSION: No acute osseous abnormality. Electronically Signed   By: Prudencio Pair M.D.   On: 12/30/2019 20:12    Procedures Procedures (including critical care time)  Medications Ordered in ED Medications - No data to display  ED Course  I have  reviewed the triage vital signs and the nursing notes.  Pertinent labs & imaging results that were available during my care of the patient were reviewed by me and considered in my medical decision making (see chart for details).    MDM Rules/Calculators/A&P                          Patient with chronic low back pain and chronic left knee pain, presenting with 1 week of worsening symptoms.  She had a fall today due to pain, without head injury.  She has radicular pain down the left posterior thigh to the knee, no numbness or weakness, no incontinence.  She is neurovascularly intact on exam however is unable to ambulate secondary to pain. Symptoms seem most consistent with chronic lumbar radiculopathy, no red flags. However given worsening pain and difficulty ambulating with fall today, offered imaging for further assessment. Patient voices being displeased with her ED stay prior to my evaluation.  She has placed a call to her orthopedic specialist while awaiting evaluation.  She is offered symptomatic management and CT imaging of the L-spine here in the ED, however patient declines all interventions and would prefer to be discharged and follow-up with her orthopedist.  She is welcome to return should she change her mind.  Final Clinical Impression(s) / ED Diagnoses Final diagnoses:  Lumbar radiculopathy, chronic    Rx / DC Orders ED Discharge Orders    None       Lovene Maret, [place] N, PA-C 12/30/19 2338    Davonna Belling, MD 12/31/19 1448

## 2019-12-30 NOTE — Discharge Instructions (Addendum)
We offered medications to treat your pain and CT imaging today for further assessment.  If you have worsening pain, new numbness or weakness in your extremities, or other concerns, we are happy to care for you further in the ED as needed.

## 2020-02-08 ENCOUNTER — Other Ambulatory Visit: Payer: Self-pay | Admitting: Cardiovascular Disease

## 2020-03-03 ENCOUNTER — Other Ambulatory Visit: Payer: Self-pay | Admitting: Cardiovascular Disease

## 2020-03-14 ENCOUNTER — Other Ambulatory Visit: Payer: Self-pay | Admitting: Cardiovascular Disease

## 2020-03-30 ENCOUNTER — Other Ambulatory Visit: Payer: Self-pay | Admitting: Cardiovascular Disease

## 2020-06-17 ENCOUNTER — Emergency Department (HOSPITAL_COMMUNITY): Payer: Medicare Other

## 2020-06-17 ENCOUNTER — Inpatient Hospital Stay (HOSPITAL_COMMUNITY): Payer: Medicare Other

## 2020-06-17 ENCOUNTER — Encounter (HOSPITAL_COMMUNITY): Payer: Self-pay | Admitting: Emergency Medicine

## 2020-06-17 ENCOUNTER — Inpatient Hospital Stay (HOSPITAL_COMMUNITY)
Admission: EM | Admit: 2020-06-17 | Discharge: 2020-06-19 | DRG: 917 | Disposition: A | Discharge status: Home / Self Care | Payer: Medicare Other | Attending: Family Medicine | Admitting: Family Medicine

## 2020-06-17 DIAGNOSIS — R739 Hyperglycemia, unspecified: Secondary | ICD-10-CM | POA: Diagnosis present

## 2020-06-17 DIAGNOSIS — Z96651 Presence of right artificial knee joint: Secondary | ICD-10-CM | POA: Diagnosis present

## 2020-06-17 DIAGNOSIS — T510X1A Toxic effect of ethanol, accidental (unintentional), initial encounter: Secondary | ICD-10-CM | POA: Diagnosis present

## 2020-06-17 DIAGNOSIS — E785 Hyperlipidemia, unspecified: Secondary | ICD-10-CM | POA: Diagnosis present

## 2020-06-17 DIAGNOSIS — E872 Acidosis: Secondary | ICD-10-CM | POA: Diagnosis present

## 2020-06-17 DIAGNOSIS — J9621 Acute and chronic respiratory failure with hypoxia: Secondary | ICD-10-CM | POA: Diagnosis present

## 2020-06-17 DIAGNOSIS — J9602 Acute respiratory failure with hypercapnia: Secondary | ICD-10-CM | POA: Diagnosis not present

## 2020-06-17 DIAGNOSIS — Z20822 Contact with and (suspected) exposure to covid-19: Secondary | ICD-10-CM | POA: Diagnosis present

## 2020-06-17 DIAGNOSIS — Z833 Family history of diabetes mellitus: Secondary | ICD-10-CM

## 2020-06-17 DIAGNOSIS — E7800 Pure hypercholesterolemia, unspecified: Secondary | ICD-10-CM | POA: Diagnosis present

## 2020-06-17 DIAGNOSIS — J690 Pneumonitis due to inhalation of food and vomit: Secondary | ICD-10-CM | POA: Diagnosis present

## 2020-06-17 DIAGNOSIS — F32A Depression, unspecified: Secondary | ICD-10-CM | POA: Diagnosis present

## 2020-06-17 DIAGNOSIS — G4733 Obstructive sleep apnea (adult) (pediatric): Secondary | ICD-10-CM | POA: Diagnosis present

## 2020-06-17 DIAGNOSIS — K219 Gastro-esophageal reflux disease without esophagitis: Secondary | ICD-10-CM | POA: Diagnosis present

## 2020-06-17 DIAGNOSIS — Z9049 Acquired absence of other specified parts of digestive tract: Secondary | ICD-10-CM

## 2020-06-17 DIAGNOSIS — Z7982 Long term (current) use of aspirin: Secondary | ICD-10-CM | POA: Diagnosis not present

## 2020-06-17 DIAGNOSIS — R6520 Severe sepsis without septic shock: Secondary | ICD-10-CM | POA: Diagnosis present

## 2020-06-17 DIAGNOSIS — G9341 Metabolic encephalopathy: Secondary | ICD-10-CM | POA: Diagnosis present

## 2020-06-17 DIAGNOSIS — Z800 Family history of malignant neoplasm of digestive organs: Secondary | ICD-10-CM | POA: Diagnosis not present

## 2020-06-17 DIAGNOSIS — Z8249 Family history of ischemic heart disease and other diseases of the circulatory system: Secondary | ICD-10-CM | POA: Diagnosis not present

## 2020-06-17 DIAGNOSIS — F419 Anxiety disorder, unspecified: Secondary | ICD-10-CM | POA: Diagnosis present

## 2020-06-17 DIAGNOSIS — Z85828 Personal history of other malignant neoplasm of skin: Secondary | ICD-10-CM | POA: Diagnosis not present

## 2020-06-17 DIAGNOSIS — J9601 Acute respiratory failure with hypoxia: Secondary | ICD-10-CM | POA: Diagnosis present

## 2020-06-17 DIAGNOSIS — Z978 Presence of other specified devices: Secondary | ICD-10-CM

## 2020-06-17 DIAGNOSIS — A419 Sepsis, unspecified organism: Secondary | ICD-10-CM | POA: Diagnosis present

## 2020-06-17 DIAGNOSIS — T424X1A Poisoning by benzodiazepines, accidental (unintentional), initial encounter: Secondary | ICD-10-CM | POA: Diagnosis present

## 2020-06-17 DIAGNOSIS — I10 Essential (primary) hypertension: Secondary | ICD-10-CM | POA: Diagnosis present

## 2020-06-17 DIAGNOSIS — R0600 Dyspnea, unspecified: Secondary | ICD-10-CM

## 2020-06-17 DIAGNOSIS — J9622 Acute and chronic respiratory failure with hypercapnia: Secondary | ICD-10-CM | POA: Diagnosis present

## 2020-06-17 DIAGNOSIS — R7303 Prediabetes: Secondary | ICD-10-CM | POA: Diagnosis present

## 2020-06-17 LAB — BASIC METABOLIC PANEL
Anion gap: 9 (ref 5–15)
BUN: 19 mg/dL (ref 8–23)
CO2: 27 mmol/L (ref 22–32)
Calcium: 9.5 mg/dL (ref 8.9–10.3)
Chloride: 103 mmol/L (ref 98–111)
Creatinine, Ser: 1.04 mg/dL — ABNORMAL HIGH (ref 0.44–1.00)
GFR, Estimated: 57 mL/min — ABNORMAL LOW (ref >60)
Glucose, Bld: 156 mg/dL — ABNORMAL HIGH (ref 70–99)
Potassium: 4.5 mmol/L (ref 3.5–5.1)
Sodium: 139 mmol/L (ref 135–145)

## 2020-06-17 LAB — BLOOD GAS, ARTERIAL
Acid-Base Excess: 2.9 mmol/L — ABNORMAL HIGH (ref 0.0–2.0)
Acid-base deficit: 2 mmol/L (ref 0.0–2.0)
Acid-base deficit: 2.4 mmol/L — ABNORMAL HIGH (ref 0.0–2.0)
Bicarbonate: 25.1 mmol/L (ref 20.0–28.0)
Bicarbonate: 21 mmol/L (ref 20.0–28.0)
Bicarbonate: 22 mmol/L (ref 20.0–28.0)
FIO2: 100
FIO2: 100
FIO2: 75
O2 Saturation: 89.5 %
O2 Saturation: 95.1 %
O2 Saturation: 92.3 %
Patient temperature: 37
Patient temperature: 37
Patient temperature: 37
pCO2 arterial: 62.4 mmHg — ABNORMAL HIGH (ref 32.0–48.0)
pCO2 arterial: 67.4 mmHg (ref 32.0–48.0)
pCO2 arterial: 43.4 mmHg (ref 32.0–48.0)
pH, Arterial: 7.288 — ABNORMAL LOW (ref 7.350–7.450)
pH, Arterial: 7.197 — CL (ref 7.350–7.450)
pH, Arterial: 7.336 — ABNORMAL LOW (ref 7.350–7.450)
pO2, Arterial: 65.1 mmHg — ABNORMAL LOW (ref 83.0–108.0)
pO2, Arterial: 93.8 mmHg (ref 83.0–108.0)
pO2, Arterial: 64.7 mmHg — ABNORMAL LOW (ref 83.0–108.0)

## 2020-06-17 LAB — LACTIC ACID, PLASMA
Lactic Acid, Venous: 2.6 mmol/L (ref 0.5–1.9)
Lactic Acid, Venous: 1.9 mmol/L (ref 0.5–1.9)

## 2020-06-17 LAB — BRAIN NATRIURETIC PEPTIDE: B Natriuretic Peptide: 120 pg/mL — ABNORMAL HIGH (ref 0.0–100.0)

## 2020-06-17 LAB — RAPID URINE DRUG SCREEN, HOSP PERFORMED
Amphetamines: NOT DETECTED
Barbiturates: NOT DETECTED
Benzodiazepines: POSITIVE — AB
Cocaine: NOT DETECTED
Opiates: POSITIVE — AB
Tetrahydrocannabinol: NOT DETECTED

## 2020-06-17 LAB — URINALYSIS, ROUTINE W REFLEX MICROSCOPIC
Bilirubin Urine: NEGATIVE
Glucose, UA: NEGATIVE mg/dL
Hgb urine dipstick: NEGATIVE
Ketones, ur: NEGATIVE mg/dL
Leukocytes,Ua: NEGATIVE
Nitrite: NEGATIVE
Protein, ur: NEGATIVE mg/dL
Specific Gravity, Urine: 1.014 (ref 1.005–1.030)
pH: 5 (ref 5.0–8.0)

## 2020-06-17 LAB — CBC
HCT: 44.3 % (ref 36.0–46.0)
Hemoglobin: 15 g/dL (ref 12.0–15.0)
MCH: 32.8 pg (ref 26.0–34.0)
MCHC: 33.9 g/dL (ref 30.0–36.0)
MCV: 96.7 fL (ref 80.0–100.0)
Platelets: 292 10*3/uL (ref 150–400)
RBC: 4.58 MIL/uL (ref 3.87–5.11)
RDW: 12.1 % (ref 11.5–15.5)
WBC: 17.3 10*3/uL — ABNORMAL HIGH (ref 4.0–10.5)
nRBC: 0 % (ref 0.0–0.2)

## 2020-06-17 LAB — RESP PANEL BY RT-PCR (FLU A&B, COVID) ARPGX2
Influenza A by PCR: NEGATIVE
Influenza B by PCR: NEGATIVE
SARS Coronavirus 2 by RT PCR: NEGATIVE

## 2020-06-17 LAB — MRSA PCR SCREENING: MRSA by PCR: NEGATIVE

## 2020-06-17 LAB — TROPONIN I (HIGH SENSITIVITY)
Troponin I (High Sensitivity): 12 ng/L (ref <18)
Troponin I (High Sensitivity): 14 ng/L (ref <18)

## 2020-06-17 LAB — GLUCOSE, CAPILLARY: Glucose-Capillary: 163 mg/dL — ABNORMAL HIGH (ref 70–99)

## 2020-06-17 LAB — CBG MONITORING, ED: Glucose-Capillary: 157 mg/dL — ABNORMAL HIGH (ref 70–99)

## 2020-06-17 MED ORDER — PROPOFOL 1000 MG/100ML IV EMUL
INTRAVENOUS | Status: AC
  Administered 2020-06-17: 10 ug/kg/min via INTRAVENOUS
  Filled 2020-06-17: qty 100

## 2020-06-17 MED ORDER — CHLORHEXIDINE GLUCONATE CLOTH 2 % EX PADS
6.0000 | MEDICATED_PAD | Freq: Every day | CUTANEOUS | Status: DC
  Administered 2020-06-17 – 2020-06-18 (×2): 6 via TOPICAL

## 2020-06-17 MED ORDER — CHLORHEXIDINE GLUCONATE 0.12% ORAL RINSE (MEDLINE KIT)
15.0000 mL | Freq: Two times a day (BID) | OROMUCOSAL | Status: DC
  Administered 2020-06-17 – 2020-06-18 (×2): 15 mL via OROMUCOSAL

## 2020-06-17 MED ORDER — PIPERACILLIN-TAZOBACTAM 4.5 G IVPB
4.5000 g | Freq: Once | INTRAVENOUS | Status: AC
  Administered 2020-06-17: 4.5 g via INTRAVENOUS
  Filled 2020-06-17 (×2): qty 100

## 2020-06-17 MED ORDER — SODIUM CHLORIDE 0.9 % IV SOLN
2.0000 g | Freq: Once | INTRAVENOUS | Status: AC
  Administered 2020-06-17: 2 g via INTRAVENOUS
  Filled 2020-06-17: qty 20

## 2020-06-17 MED ORDER — PROPOFOL 1000 MG/100ML IV EMUL
5.0000 ug/kg/min | INTRAVENOUS | Status: DC
  Administered 2020-06-18: 40 ug/kg/min via INTRAVENOUS
  Administered 2020-06-18: 20 ug/kg/min via INTRAVENOUS
  Filled 2020-06-17 (×2): qty 100

## 2020-06-17 MED ORDER — INSULIN ASPART 100 UNIT/ML ~~LOC~~ SOLN
0.0000 [IU] | Freq: Three times a day (TID) | SUBCUTANEOUS | Status: DC
  Administered 2020-06-18: 2 [IU] via SUBCUTANEOUS
  Administered 2020-06-18: 1 [IU] via SUBCUTANEOUS

## 2020-06-17 MED ORDER — ETOMIDATE 2 MG/ML IV SOLN
20.0000 mg | Freq: Once | INTRAVENOUS | Status: AC
  Administered 2020-06-17: 20 mg via INTRAVENOUS
  Filled 2020-06-17: qty 10

## 2020-06-17 MED ORDER — ENOXAPARIN SODIUM 40 MG/0.4ML ~~LOC~~ SOLN
40.0000 mg | SUBCUTANEOUS | Status: DC
  Administered 2020-06-17 – 2020-06-18 (×2): 40 mg via SUBCUTANEOUS
  Filled 2020-06-17 (×2): qty 0.4

## 2020-06-17 MED ORDER — SUCCINYLCHOLINE CHLORIDE 20 MG/ML IJ SOLN
100.0000 mg | Freq: Once | INTRAMUSCULAR | Status: AC
  Administered 2020-06-17: 100 mg via INTRAVENOUS
  Filled 2020-06-17: qty 1

## 2020-06-17 MED ORDER — ALBUTEROL SULFATE (2.5 MG/3ML) 0.083% IN NEBU
INHALATION_SOLUTION | RESPIRATORY_TRACT | Status: AC
  Filled 2020-06-17: qty 3

## 2020-06-17 MED ORDER — SODIUM CHLORIDE 0.9 % IV SOLN
1.5000 g | Freq: Four times a day (QID) | INTRAVENOUS | Status: DC
  Administered 2020-06-18: 1.5 g via INTRAVENOUS
  Filled 2020-06-17 (×4): qty 4

## 2020-06-17 MED ORDER — ORAL CARE MOUTH RINSE
15.0000 mL | OROMUCOSAL | Status: DC
  Administered 2020-06-17 – 2020-06-18 (×8): 15 mL via OROMUCOSAL

## 2020-06-17 MED ORDER — PANTOPRAZOLE SODIUM 40 MG IV SOLR
40.0000 mg | INTRAVENOUS | Status: DC
  Administered 2020-06-17: 40 mg via INTRAVENOUS
  Filled 2020-06-17: qty 40

## 2020-06-17 MED ORDER — ASPIRIN EC 81 MG PO TBEC
81.0000 mg | DELAYED_RELEASE_TABLET | Freq: Every day | ORAL | Status: DC
  Filled 2020-06-17: qty 1

## 2020-06-17 MED ORDER — METOPROLOL TARTRATE 5 MG/5ML IV SOLN
5.0000 mg | Freq: Four times a day (QID) | INTRAVENOUS | Status: DC
  Administered 2020-06-17 – 2020-06-18 (×2): 5 mg via INTRAVENOUS
  Filled 2020-06-17 (×2): qty 5

## 2020-06-17 MED ORDER — ONDANSETRON HCL 4 MG/2ML IJ SOLN: 4.0000 mg | Freq: Four times a day (QID) | INTRAMUSCULAR | Status: DC | PRN

## 2020-06-17 MED ORDER — NOREPINEPHRINE 4 MG/250ML-% IV SOLN: 0.0000 ug/min | INTRAVENOUS | Status: DC

## 2020-06-17 MED ORDER — SODIUM CHLORIDE 0.9 % IV BOLUS
1000.0000 mL | Freq: Once | INTRAVENOUS | Status: AC
  Administered 2020-06-17: 1000 mL via INTRAVENOUS

## 2020-06-17 MED ORDER — FENTANYL CITRATE (PF) 100 MCG/2ML IJ SOLN: 100.0000 ug | Freq: Once | INTRAMUSCULAR | Status: AC

## 2020-06-17 MED ORDER — PIPERACILLIN-TAZOBACTAM 4.5 G IVPB
INTRAVENOUS | Status: AC
  Filled 2020-06-17: qty 100

## 2020-06-17 MED ORDER — ONDANSETRON HCL 4 MG PO TABS: 4.0000 mg | ORAL_TABLET | Freq: Four times a day (QID) | ORAL | Status: DC | PRN

## 2020-06-17 MED ORDER — FENTANYL CITRATE (PF) 100 MCG/2ML IJ SOLN
INTRAMUSCULAR | Status: AC
  Administered 2020-06-17: 100 ug via INTRAVENOUS
  Filled 2020-06-17: qty 2

## 2020-06-17 MED ORDER — SODIUM CHLORIDE 0.9 % IV SOLN
500.0000 mg | INTRAVENOUS | Status: DC
  Administered 2020-06-17: 500 mg via INTRAVENOUS
  Filled 2020-06-17: qty 500

## 2020-06-17 MED ORDER — INSULIN ASPART 100 UNIT/ML ~~LOC~~ SOLN: 0.0000 [IU] | Freq: Every day | SUBCUTANEOUS | Status: DC

## 2020-06-17 MED ORDER — IPRATROPIUM BROMIDE 0.02 % IN SOLN
RESPIRATORY_TRACT | Status: AC
  Filled 2020-06-17: qty 2.5

## 2020-06-17 MED ORDER — PROPOFOL 1000 MG/100ML IV EMUL: 5.0000 ug/kg/min | INTRAVENOUS | Status: DC

## 2020-06-17 MED ORDER — IPRATROPIUM-ALBUTEROL 0.5-2.5 (3) MG/3ML IN SOLN
3.0000 mL | Freq: Four times a day (QID) | RESPIRATORY_TRACT | Status: DC
  Administered 2020-06-18 (×2): 3 mL via RESPIRATORY_TRACT
  Filled 2020-06-17 (×2): qty 3

## 2020-06-17 MED ORDER — SODIUM CHLORIDE 0.9 % IV BOLUS
1000.0000 mL | Freq: Once | INTRAVENOUS | Status: AC
Start: 2020-06-17 — End: 2020-06-17
  Administered 2020-06-17: 1000 mL via INTRAVENOUS

## 2020-06-17 NOTE — ED Provider Notes (Addendum)
[facility] Provider Note   CSN: [id number] Arrival date & time: 06/17/20  1400     History Chief Complaint  Patient presents with  . Altered Mental Status  . Weakness    [name] is a 75 y.o. female.  Pt presents to the ED today with AMS and hypoxia.  Pt's husband passed away (expected; on hospice) yesterday.  Pt's son was with her all day and said she seemed fine.  He took her to dinner and she came back and went to sleep.  He called her this morning to check on her and she did not pick up.  The funeral service was supposed to be this afternoon, so he went by the house and she would not answer the door.  He did not have his spare key, so he went in through a window.  He found his mom on her bed.  There was a little vomit on her chest and she was lying on her back.  He said she looked dead at first.  He has a home pulse ox from his stepfather and he put it on his mom's finger and it was 52%.  He was able to wake her up, but she was not acting right.  He called EMS who got an O2 sat of 57%.  They put her on a NRB.  The pt denies any drug od.  She did drink 3 beers last night and a shot of rum.  Son said that was all that was in the garbage.  Pt denies any f/c.  She said she's had the Covid vaccine + booster.  No known sick contacts.        Past Medical History:  Diagnosis Date  . Allergic rhinitis, cause unspecified   . Arthritis   . Cancer ([place])    hx skin cancer  . Depression   . Esophageal stenosis   . GERD (gastroesophageal reflux disease)   . Hiatal hernia   . History of bronchitis as a child   . History of nonmelanoma skin cancer   . History of urinary tract infection   . Hyperlipidemia   . Leg fracture, right    2015  . Multiple falls   . Pneumonia    2015  . Unspecified essential hypertension    PT STOPPED BP MED WITH PCP KNOWLEDGE  . Wears glasses     Patient Active Problem List   Diagnosis Date Noted  . Dyspnea 09/22/2018  .  Obstructive sleep apnea 01/15/2018  . Precordial pain   . 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 OA (osteoarthritis) of knee 08/09/2014  . UTI (urinary tract infection) 12/25/2013  . Fever 12/23/2013  . Hospital acquired PNA 12/23/2013  . Tibial plateau fracture 12/21/2013  . Insomnia 06/14/2010  . HYPERCHOLESTEROLEMIA 06/02/2007  . Essential hypertension 06/02/2007  . ALLERGIC RHINITIS 06/02/2007  . GASTROESOPHAGEAL REFLUX DISEASE 06/02/2007  . HIATAL HERNIA 06/02/2007  . EPIGASTRIC PAIN 06/02/2007  . GASTRITIS 05/01/2004    Past Surgical History:  Procedure Laterality Date  . APPENDECTOMY  1984  . BLADDER SUSPENSION  SEVERAL YRS AGO  . HAND SURGERY     trigger finger release bilat   . HARDWARE REMOVAL Right 04/19/2014   Procedure: HARDWARE REMOVAL FROM RIGHT TIBIA;  Surgeon: [name], MD;  Location: [facility];  Service: Orthopedics;  Laterality: Right;  . LAPAROSCOPIC CHOLECYSTECTOMY  04/2004  . laporoscopy  1977   ovarian cyst  . LATERAL  COLLATERAL LIGAMENT REPAIR, KNEE     RT KNEE  . LEFT HEART CATH AND CORONARY ANGIOGRAPHY N/A 11/12/2016   Procedure: LEFT HEART CATH AND CORONARY ANGIOGRAPHY;  Surgeon: Burnell Blanks, MD;  Location: [facility];  Service: Cardiovascular;  Laterality: N/A;  . LUMBAR LAMINECTOMY/DECOMPRESSION MICRODISCECTOMY N/A 03/07/2015   Procedure: COMPLETE DECOMPRESSION/LUMBAR LAMINECTOMY L4 - L5 FOR STENOSIS 1 LEVEL;  Surgeon: Latanya Maudlin, MD;  Location: [facility];  Service: Orthopedics;  Laterality: N/A;  . ORIF TIBIA PLATEAU Right 12/26/2013   Procedure: OPEN REDUCTION INTERNAL FIXATION (ORIF) TIBIAL PLATEAU;  Surgeon: [name], MD;  Location: [facility];  Service: Orthopedics;  Laterality: Right;  . TOTAL ANKLE REPLACEMENT     7 years ago; left   . TOTAL KNEE ARTHROPLASTY Right 08/09/2014   Procedure: RIGHT TOTAL KNEE ARTHROPLASTY;  Surgeon: Gaynelle Arabian, MD;  Location: [facility];  Service:  Orthopedics;  Laterality: Right;  . TUBAL LIGATION     75 reversal in 81 and tubal in 85     OB History   No obstetric history on file.     Family History  Problem Relation Age of Onset  . Stomach cancer Brother 68       question if started pancreatic   . Diabetes Sister   . Heart disease Brother   . Colon cancer Neg Hx     Social History   Tobacco Use  . Smoking status: Never Smoker  . Smokeless tobacco: Never Used  Vaping Use  . Vaping Use: Never used  Substance Use Topics  . Alcohol use: Yes    Alcohol/week: 3.0 standard drinks    Types: 3 Cans of beer per week    Comment: OCCASIONAL  . Drug use: No    Home Medications Prior to Admission medications   Medication Sig Start Date End Date Taking? Authorizing Provider  amLODipine (NORVASC) 10 MG tablet Take 10 mg by mouth every morning.    [provider]  aspirin EC 81 MG tablet Take 81 mg by mouth daily.    [provider]  calcium citrate-vitamin D (CITRACAL+D) 315-200 MG-UNIT per tablet Take 1 tablet by mouth every morning.    [provider]  Cholecalciferol (VITAMIN D3) 400 units CAPS Take 400 Units by mouth daily.    [provider]  DULoxetine (CYMBALTA) 30 MG capsule Take 60 mg by mouth daily. 10/24/14   [provider]  metoprolol succinate (TOPROL-XL) 25 MG 24 hr tablet Take 1 tablet (25 mg total) by mouth daily. Please make overdue appt with Dr. Angelena Form before anymore refills. Thank you 2nd attempt 03/05/20   Burnell Blanks, MD  Multiple Vitamin (MULTIVITAMIN WITH MINERALS) TABS tablet Take 1 tablet by mouth daily.    [provider]  omeprazole (PRILOSEC) 40 MG capsule Take 40 mg by mouth daily as needed (for acid reflux.).    [provider]  vitamin E 400 UNIT capsule Take 400 Units by mouth daily.    [provider]  VYTORIN 10-40 MG per tablet Take 1 tablet by mouth at bedtime. 06/20/14   [provider]     Allergies    Patient has no known allergies.  Review of Systems   Review of Systems  Respiratory: Positive for shortness of breath.   All other systems reviewed and are negative.   Physical Exam Updated Vital Signs BP 129/60   Pulse (!) 102   Temp 98.6 F (37 C) (Oral)   Resp Marland Kitchen)  21   Ht 5\' 4"  (1.626 m)   Wt 81.6 kg   SpO2 97%   BMI 30.90 kg/m   Physical Exam Vitals and nursing note reviewed.  Constitutional:      General: She is in acute distress.     Appearance: She is ill-appearing.  HENT:     Head: Normocephalic and atraumatic.     Right Ear: External ear normal.     Left Ear: External ear normal.     Nose: Nose normal.     Mouth/Throat:     Mouth: Mucous membranes are dry.  Eyes:     Extraocular Movements: Extraocular movements intact.     Conjunctiva/sclera: Conjunctivae normal.     Pupils: Pupils are equal, round, and reactive to light.  Cardiovascular:     Rate and Rhythm: Regular rhythm. Tachycardia present.     Pulses: Normal pulses.     Heart sounds: Normal heart sounds.  Pulmonary:     Effort: Respiratory distress present.     Breath sounds: Rhonchi present.  Abdominal:     General: Abdomen is flat. Bowel sounds are normal.     Palpations: Abdomen is soft.  Musculoskeletal:        General: Normal range of motion.     Cervical back: Normal range of motion and neck supple.  Skin:    General: Skin is warm.     Capillary Refill: Capillary refill takes less than 2 seconds.  Neurological:     General: No focal deficit present.     Mental Status: She is oriented to person, place, and time.  Psychiatric:        Mood and Affect: Mood normal.        Behavior: Behavior is slowed.        Thought Content: Thought content normal.        Judgment: Judgment normal.     ED Results / Procedures / Treatments   Labs (all labs ordered are listed, but only abnormal results are displayed) Labs Reviewed  BLOOD GAS, ARTERIAL - Abnormal; Notable for the  following components:      Result Value   pH, Arterial 7.288 (*)    pCO2 arterial 62.4 (*)    pO2, Arterial 65.1 (*)    Acid-Base Excess 2.9 (*)    All other components within normal limits  BASIC METABOLIC PANEL - Abnormal; Notable for the following components:   Glucose, Bld 156 (*)    Creatinine, Ser 1.04 (*)    GFR, Estimated 57 (*)    All other components within normal limits  BRAIN NATRIURETIC PEPTIDE - Abnormal; Notable for the following components:   B Natriuretic Peptide 120.0 (*)    All other components within normal limits  LACTIC ACID, PLASMA - Abnormal; Notable for the following components:   Lactic Acid, Venous 2.6 (*)    All other components within normal limits  CBC - Abnormal; Notable for the following components:   WBC 17.3 (*)    All other components within normal limits  CBG MONITORING, ED - Abnormal; Notable for the following components:   Glucose-Capillary 157 (*)    All other components within normal limits  CULTURE, BLOOD (ROUTINE X 2)  RESP PANEL BY RT-PCR (FLU A&B, COVID) ARPGX2  CULTURE, BLOOD (ROUTINE X 2)  MRSA PCR SCREENING  LACTIC ACID, PLASMA  URINALYSIS, ROUTINE W REFLEX MICROSCOPIC  RAPID URINE DRUG SCREEN, HOSP PERFORMED  TROPONIN I (HIGH SENSITIVITY)  TROPONIN I (HIGH SENSITIVITY)  EKG EKG Interpretation  Date/Time:  Sunday June 17 2020 14:15:43 EDT Ventricular Rate:  103 PR Interval:  156 QRS Duration: 131 QT Interval:  349 QTC Calculation: 457 R Axis:   -25 Text Interpretation: Sinus tachycardia Right bundle branch block Confirmed by Isla Pence [phone]) on 06/17/2020 3:49:41 PM   Radiology DG Chest Portable 1 View  Result Date: 06/17/2020 CLINICAL DATA:  Weakness, vomiting and altered mental status. EXAM: PORTABLE CHEST 1 VIEW COMPARISON:  Sent 11/14/2018 FINDINGS: The cardiac silhouette, mediastinal and hilar contours are within normal limits and stable. Stable eventration of the right hemidiaphragm. Underlying bronchitic  changes with superimposed patchy bilateral infiltrates. No pleural effusions. No pneumothorax. The bony thorax is intact. IMPRESSION: Patchy bilateral infiltrates. Electronically Signed   By: Marijo Sanes M.D.   On: 06/17/2020 15:18    Procedures Procedure Name: Intubation Date/Time: 06/17/2020 8:44 PM Performed by: Isla Pence, MD Pre-anesthesia Checklist: Patient identified Oxygen Delivery Method: Ambu bag Preoxygenation: Pre-oxygenation with 100% oxygen Induction Type: Rapid sequence Ventilation: Mask ventilation without difficulty Laryngoscope Size: Glidescope and 3 Tube size: 7.5 mm Number of attempts: 1 Placement Confirmation: ETT inserted through vocal cords under direct vision,  Positive ETCO2 and Breath sounds checked- equal and bilateral Secured at: 22 cm Tube secured with: ETT holder Dental Injury: Teeth and Oropharynx as per pre-operative assessment     .Central Line  Date/Time: 06/17/2020 8:44 PM Performed by: Isla Pence, MD Authorized by: Isla Pence, MD   Consent:    Consent obtained:  Emergent situation Universal protocol:    Patient identity confirmed:  Arm band Pre-procedure details:    Indication(s): central venous access and insufficient peripheral access     Hand hygiene: Hand hygiene performed prior to insertion     Sterile barrier technique: All elements of maximal sterile technique followed     Skin preparation:  Chlorhexidine   Skin preparation agent: Skin preparation agent completely dried prior to procedure   Procedure details:    Location:  R femoral   Site selection rationale:  RT and nurses were attending to pt's ETT and NG tube, so physical IJ access unavailable   Procedural supplies:  Triple lumen   Catheter size:  7 Fr   Ultrasound guidance: no     Number of attempts:  1   Successful placement: yes   Post-procedure details:    Post-procedure:  Dressing applied and line sutured   Assessment:  Blood return through all ports    Procedure completion:  Tolerated well, no immediate complications     Medications Ordered in ED Medications  azithromycin (ZITHROMAX) 500 mg in sodium chloride 0.9 % 250 mL IVPB (0 mg Intravenous Stopped 06/17/20 1724)  piperacillin-tazobactam (ZOSYN) IVPB 4.5 g (has no administration in time range)  sodium chloride 0.9 % bolus 1,000 mL (0 mLs Intravenous Stopped 06/17/20 1708)  cefTRIAXone (ROCEPHIN) 2 g in sodium chloride 0.9 % 100 mL IVPB (0 g Intravenous Stopped 06/17/20 1639)  sodium chloride 0.9 % bolus 1,000 mL (1,000 mLs Intravenous New Bag/Given 06/17/20 1708)    ED Course  I have reviewed the triage vital signs and the nursing notes.  Pertinent labs & imaging results that were available during my care of the patient were reviewed by me and considered in my medical decision making (see chart for details).    MDM Rules/Calculators/A&P                           Pt changed  from a NRB to bipap and she looks more comfortable.  ABG taken prior to bipap shows an elevated pco2 and a low O2.  CXR with bilateral patchy pneumonia.  Rocephin and zithromax ordered.  But due to suspicion of aspiration pneumonia, pt given zosyn.  Pt given IVFs and bp has improved.   Pt's resp status has improved on bipap and she looks much more comfortable.  Pt is Covid neg.  [place] W Zolman was evaluated in Emergency Department on 06/17/2020 for the symptoms described in the history of present illness. She was evaluated in the context of the global COVID-19 pandemic, which necessitated consideration that the patient might be at risk for infection with the SARS-CoV-2 virus that causes COVID-19. Institutional protocols and algorithms that pertain to the evaluation of patients at risk for COVID-19 are in a state of rapid change based on information released by regulatory bodies including the CDC and federal and state organizations. These policies and algorithms were followed during the patient's care in the  ED.  CRITICAL CARE Performed by: Isla Pence   Total critical care time: 45 minutes  Critical care time was exclusive of separately billable procedures and treating other patients.  Critical care was necessary to treat or prevent imminent or life-threatening deterioration.  Critical care was time spent personally by me on the following activities: development of treatment plan with patient and/or surrogate as well as nursing, discussions with consultants, evaluation of patient's response to treatment, examination of patient, obtaining history from patient or surrogate, ordering and performing treatments and interventions, ordering and review of laboratory studies, ordering and review of radiographic studies, pulse oximetry and re-evaluation of patient's condition.  Pt d/w Dr. Nehemiah Settle (triad) for admission.  Before pt went up to the ICU, her ms seemed to worsen, so a repeat ABG was done.  Her ph is 7.197.  Dr. Nehemiah Settle spoke with the son and he agreed with plan to intubate.  Dr. Nehemiah Settle asked me to intubate and place a central line.  This has been completed.  Final Clinical Impression(s) / ED Diagnoses Final diagnoses:  Acute respiratory failure with hypoxia and hypercapnia (HCC)  Aspiration pneumonia of both lungs due to gastric secretions, unspecified part of lung [facility])    Rx / DC Orders ED Discharge Orders    None       Isla Pence, MD 06/17/20 1814    Isla Pence, MD 06/17/20 2046

## 2020-06-17 NOTE — ED Notes (Signed)
Pt A&Ox4 at this time. Pt resting comfortably on Bipap. Son at bedside.

## 2020-06-17 NOTE — ED Notes (Signed)
Date and time results received: 06/17/20 & 1634hrs   Test: Lactic acid  Critical Value: 2.6   Name of Provider Notified: Dr Gilford Raid  Orders Received? Or Actions Taken?: notified

## 2020-06-17 NOTE — ED Notes (Signed)
Dr. Nehemiah Settle returned call and stated for nurse to call respiratory therapy to come to the room. Respiratory called.

## 2020-06-17 NOTE — ED Notes (Signed)
Pt oxygen 57% on room air. Pt placed on NRB and oxygen at 90%

## 2020-06-17 NOTE — ED Notes (Signed)
Date and time results received: 06/17/20 & 1850hrs   Test * critical value:  PH = 7.197  PCO2 = 67.4   Name of Provider Notified: Notified Dr. Nehemiah Settle to call for critical labs results via chat  Orders Received? Or Actions Taken?: waiting on return call

## 2020-06-17 NOTE — H&P (Addendum)
History and Physical  [name] VZD:[id number] DOB: 1945-06-18 DOA: 06/17/2020  Referring physician: Dr Gilford Raid, ED physician PCP: Aura Dials, PA-C  Outpatient Specialists:   Patient Coming From: home  Chief Complaint: respiratory failure  HPI: [name] is a 75 y.o. female with a history of hypertension, GERD, hyperlipidemia, depression/anxiety.  Patient experienced recent loss of her husband.  Patient was left at home last night, where she drank 3 beers and a shot of rum.  Her son called to check on her this morning and when it broke into her home when there was no response on the telephone.  Patient was found unconscious in the bed with vomit on her chest.  She moves not breathing well.  There is a pulse oximeter nearby and her oxygen saturation measured 52%.  He tried to arouse, and had to call EMS.  On EMS arrival, the patient was placed on nonrebreather and brought to the hospital for evaluation.  Emergency Department Course: In the Emergency Department, the patient was placed on BiPAP.  Chest x-ray shows bilateral infiltrates, white count of 17.  Blood cultures obtained.  Patient given Rocephin and Zosyn.  Potassium 2.6.  Patient bolused with IV fluids.  Review of Systems:   Pt denies any fevers, chills, nausea, vomiting, diarrhea, constipation, abdominal pain, palpitations, headache, vision changes, lightheadedness, dizziness, melena, rectal bleeding.  Review of systems are otherwise negative  Past Medical History:  Diagnosis Date  . Allergic rhinitis, cause unspecified   . Arthritis   . Cancer ([place])    hx skin cancer  . Depression   . Esophageal stenosis   . GERD (gastroesophageal reflux disease)   . Hiatal hernia   . History of bronchitis as a child   . History of nonmelanoma skin cancer   . History of urinary tract infection   . Hyperlipidemia   . Leg fracture, right    2015  . Multiple falls   . Pneumonia    2015  . Unspecified essential  hypertension    PT STOPPED BP MED WITH PCP KNOWLEDGE  . Wears glasses    Past Surgical History:  Procedure Laterality Date  . APPENDECTOMY  1984  . BLADDER SUSPENSION  SEVERAL YRS AGO  . HAND SURGERY     trigger finger release bilat   . HARDWARE REMOVAL Right 04/19/2014   Procedure: HARDWARE REMOVAL FROM RIGHT TIBIA;  Surgeon: [name], MD;  Location: [facility];  Service: Orthopedics;  Laterality: Right;  . LAPAROSCOPIC CHOLECYSTECTOMY  04/2004  . laporoscopy  1977   ovarian cyst  . LATERAL COLLATERAL LIGAMENT REPAIR, KNEE     RT KNEE  . LEFT HEART CATH AND CORONARY ANGIOGRAPHY N/A 11/12/2016   Procedure: LEFT HEART CATH AND CORONARY ANGIOGRAPHY;  Surgeon: Burnell Blanks, MD;  Location: [place] [facility];  Service: Cardiovascular;  Laterality: N/A;  . LUMBAR LAMINECTOMY/DECOMPRESSION MICRODISCECTOMY N/A 03/07/2015   Procedure: COMPLETE DECOMPRESSION/LUMBAR LAMINECTOMY L4 - L5 FOR STENOSIS 1 LEVEL;  Surgeon: Latanya Maudlin, MD;  Location: [facility];  Service: Orthopedics;  Laterality: N/A;  . ORIF TIBIA PLATEAU Right 12/26/2013   Procedure: OPEN REDUCTION INTERNAL FIXATION (ORIF) TIBIAL PLATEAU;  Surgeon: [name], MD;  Location: [facility];  Service: Orthopedics;  Laterality: Right;  . TOTAL ANKLE REPLACEMENT     7 years ago; left   . TOTAL KNEE ARTHROPLASTY Right 08/09/2014   Procedure: RIGHT TOTAL KNEE ARTHROPLASTY;  Surgeon: Gaynelle Arabian, MD;  Location: [facility];  Service: Orthopedics;  Laterality: Right;  . TUBAL LIGATION     76 reversal in 82 and tubal in 34   Social History:  reports that she has never smoked. She has never used smokeless tobacco. She reports current alcohol use of about 3.0 standard drinks of alcohol per week. She reports that she does not use drugs. Patient lives at home  No Known Allergies  Family History  Problem Relation Age of Onset  . Stomach cancer Brother 11       question if started pancreatic   . Diabetes Sister   . Heart disease  Brother   . Colon cancer Neg Hx       Prior to Admission medications   Medication Sig Start Date End Date Taking? Authorizing Provider  amLODipine (NORVASC) 10 MG tablet Take 10 mg by mouth every morning.    [provider]  aspirin EC 81 MG tablet Take 81 mg by mouth daily.    [provider]  busPIRone (BUSPAR) 5 MG tablet Take 5 mg by mouth 2 (two) times daily as needed. 04/22/20   [provider]  calcium citrate-vitamin D (CITRACAL+D) 315-200 MG-UNIT per tablet Take 1 tablet by mouth every morning.    [provider]  Cholecalciferol (VITAMIN D3) 400 units CAPS Take 400 Units by mouth daily.    [provider]  DULoxetine (CYMBALTA) 30 MG capsule Take 60 mg by mouth daily. 10/24/14   [provider]  famotidine (PEPCID) 20 MG tablet Take 20 mg by mouth 2 (two) times daily. 04/30/20   [provider]  metoprolol succinate (TOPROL-XL) 25 MG 24 hr tablet Take 1 tablet (25 mg total) by mouth daily. Please make overdue appt with Dr. Angelena Form before anymore refills. Thank you 2nd attempt 03/05/20   Burnell Blanks, MD  Multiple Vitamin (MULTIVITAMIN WITH MINERALS) TABS tablet Take 1 tablet by mouth daily.    [provider]  omeprazole (PRILOSEC) 20 MG capsule Take 20 mg by mouth daily. 03/23/20   [provider]  omeprazole (PRILOSEC) 40 MG capsule Take 40 mg by mouth daily as needed (for acid reflux.).    [provider]  vitamin E 400 UNIT capsule Take 400 Units by mouth daily.    [provider]  VYTORIN 10-40 MG per tablet Take 1 tablet by mouth at bedtime. 06/20/14   [provider]    Physical Exam: BP 129/60   Pulse (!) 102   Temp 98.6 F (37 C) (Oral)   Resp (!) 21   Ht 5\' 4"  (1.626 m)   Wt 81.6 kg   SpO2 97%   BMI 30.90 kg/m   . General: Elderly female. Awake and somewhat confused.  Patient orientated to self. No acute cardiopulmonary distress.  Marland Kitchen HEENT:  Normocephalic atraumatic.  Right and left ears normal in appearance.  Pupils equal, round, reactive to light. Extraocular muscles are intact. Sclerae anicteric and noninjected.  Moist mucosal membranes. No mucosal lesions.  . Neck: Neck supple without lymphadenopathy. No carotid bruits. No masses palpated.  . Cardiovascular: Regular rate with normal S1-S2 sounds. No murmurs, rubs, gallops auscultated. No JVD.  Marland Kitchen Respiratory: Rales bilaterally.  No accessory muscle use. . Abdomen: Soft, nontender, nondistended. Active bowel sounds. No masses or hepatosplenomegaly  . Skin: No rashes, lesions, or ulcerations.  Dry, warm to touch. 2+ dorsalis pedis and radial pulses. . Musculoskeletal: No calf or leg pain. All major joints not erythematous nontender.  No upper or lower joint deformation.  Good  ROM.  No contractures  . Psychiatric: Unable to determine at this point . Neurologic: No gross neurological deficits, although patient unable to complete exam          Labs on Admission: I have personally reviewed following labs and imaging studies  CBC: Recent Labs  Lab 06/17/20 1549  WBC 17.3*  HGB 15.0  HCT 44.3  MCV 96.7  PLT 161   Basic Metabolic Panel: Recent Labs  Lab 06/17/20 1549  NA 139  K 4.5  CL 103  CO2 27  GLUCOSE 156*  BUN 19  CREATININE 1.04*  CALCIUM 9.5   GFR: Estimated Creatinine Clearance: 49.8 mL/min (A) (by C-G formula based on SCr of 1.04 mg/dL (H)). Liver Function Tests: No results for input(s): AST, ALT, ALKPHOS, BILITOT, PROT, ALBUMIN in the last 168 hours. No results for input(s): LIPASE, AMYLASE in the last 168 hours. No results for input(s): AMMONIA in the last 168 hours. Coagulation Profile: No results for input(s): INR, PROTIME in the last 168 hours. Cardiac Enzymes: No results for input(s): CKTOTAL, CKMB, CKMBINDEX, TROPONINI in the last 168 hours. BNP (last 3 results) No results for input(s): PROBNP in the last 8760 hours. HbA1C: No results for  input(s): HGBA1C in the last 72 hours. CBG: Recent Labs  Lab 06/17/20 1510  GLUCAP 157*   Lipid Profile: No results for input(s): CHOL, HDL, LDLCALC, TRIG, CHOLHDL, LDLDIRECT in the last 72 hours. Thyroid Function Tests: No results for input(s): TSH, T4TOTAL, FREET4, T3FREE, THYROIDAB in the last 72 hours. Anemia Panel: No results for input(s): VITAMINB12, FOLATE, FERRITIN, TIBC, IRON, RETICCTPCT in the last 72 hours. Urine analysis:    Component Value Date/Time   COLORURINE AMBER (A) 07/12/2014 1400   APPEARANCEUR TURBID (A) 07/12/2014 1400   LABSPEC 1.029 07/12/2014 1400   PHURINE 6.0 07/12/2014 1400   GLUCOSEU NEGATIVE 07/12/2014 1400   HGBUR MODERATE (A) 07/12/2014 1400   BILIRUBINUR SMALL (A) 07/12/2014 1400   KETONESUR NEGATIVE 07/12/2014 1400   PROTEINUR 30 (A) 07/12/2014 1400   UROBILINOGEN 0.2 07/12/2014 1400   NITRITE NEGATIVE 07/12/2014 1400   LEUKOCYTESUR MODERATE (A) 07/12/2014 1400   Sepsis Labs: @LABRCNTIP (procalcitonin:4,lacticidven:4) ) Recent Results (from the past 240 hour(s))  Resp Panel by RT-PCR (Flu A&B, Covid) Nasopharyngeal Swab     Status: None   Collection Time: 06/17/20  3:10 PM   Specimen: Nasopharyngeal Swab; Nasopharyngeal(NP) swabs in vial transport medium  Result Value Ref Range Status   SARS Coronavirus 2 by RT PCR NEGATIVE NEGATIVE Final    Comment: (NOTE) SARS-CoV-2 target nucleic acids are NOT DETECTED.  The SARS-CoV-2 RNA is generally detectable in upper respiratory specimens during the acute phase of infection. The lowest concentration of SARS-CoV-2 viral copies this assay can detect is 138 copies/mL. A negative result does not preclude SARS-Cov-2 infection and should not be used as the sole basis for treatment or other patient management decisions. A negative result may occur with  improper specimen collection/handling, submission of specimen other than nasopharyngeal swab, presence of viral mutation(s) within the areas targeted  by this assay, and inadequate number of viral copies(<138 copies/mL). A negative result must be combined with clinical observations, patient history, and epidemiological information. The expected result is Negative.  Fact Sheet for Patients:  EntrepreneurPulse.com.au  Fact Sheet for Healthcare Providers:  IncredibleEmployment.be  This test is no t yet approved or cleared by the [place] FDA and  has been authorized for detection and/or diagnosis of SARS-CoV-2 by FDA under an Emergency Use Authorization (  EUA). This EUA will remain  in effect (meaning this test can be used) for the duration of the COVID-19 declaration under Section 564(b)(1) of the Act, 21 U.S.C.section 360bbb-3(b)(1), unless the authorization is terminated  or revoked sooner.       Influenza A by PCR NEGATIVE NEGATIVE Final   Influenza B by PCR NEGATIVE NEGATIVE Final    Comment: (NOTE) The Xpert Xpress SARS-CoV-2/FLU/RSV plus assay is intended as an aid in the diagnosis of influenza from Nasopharyngeal swab specimens and should not be used as a sole basis for treatment. Nasal washings and aspirates are unacceptable for Xpert Xpress SARS-CoV-2/FLU/RSV testing.  Fact Sheet for Patients: EntrepreneurPulse.com.au  Fact Sheet for Healthcare Providers: IncredibleEmployment.be  This test is not yet approved or cleared by the [place] FDA and has been authorized for detection and/or diagnosis of SARS-CoV-2 by FDA under an Emergency Use Authorization (EUA). This EUA will remain in effect (meaning this test can be used) for the duration of the COVID-19 declaration under Section 564(b)(1) of the Act, 21 U.S.C. section 360bbb-3(b)(1), unless the authorization is terminated or revoked.  Performed at [facility], [address], [place], [place] [address]   Culture, blood (routine x 2)     Status: None (Preliminary result)   Collection  Time: 06/17/20  3:59 PM   Specimen: BLOOD LEFT HAND  Result Value Ref Range Status   Specimen Description BLOOD LEFT HAND  Final   Special Requests   Final    Blood Culture results may not be optimal due to an inadequate volume of blood received in culture bottles BOTTLES DRAWN AEROBIC AND ANAEROBIC Performed at [facility], [address], [place], [place] [address]    Culture PENDING  Incomplete   Report Status PENDING  Incomplete     Radiological Exams on Admission: DG Chest Portable 1 View  Result Date: 06/17/2020 CLINICAL DATA:  Weakness, vomiting and altered mental status. EXAM: PORTABLE CHEST 1 VIEW COMPARISON:  Sent 11/14/2018 FINDINGS: The cardiac silhouette, mediastinal and hilar contours are within normal limits and stable. Stable eventration of the right hemidiaphragm. Underlying bronchitic changes with superimposed patchy bilateral infiltrates. No pleural effusions. No pneumothorax. The bony thorax is intact. IMPRESSION: Patchy bilateral infiltrates. Electronically Signed   By: Marijo Sanes M.D.   On: 06/17/2020 15:18    EKG: Independently reviewed.  Sinus tachycardia with right bundle branch block.  No acute ST changes  Assessment/Plan: Principal Problem:   Acute respiratory failure with hypoxia and hypercapnia (HCC) Active Problems:   Essential hypertension   GASTROESOPHAGEAL REFLUX DISEASE   Obstructive sleep apnea   Aspiration pneumonia of both lower lobes due to gastric secretions [facility])    This patient was discussed with the ED physician, including pertinent vitals, physical exam findings, labs, and imaging.  We also discussed care given by the ED provider.  1. Acute respiratory failure with hypoxia and hypercapnia secondary to Aspiration pneumonia a. Admit to stepdown b. Continue BiPAP c. CBC in the morning d. Change antibiotics to Unasyn e. Lactic acid improving f. Continue IV fluids g. Recheck ABG 2. Obstructive sleep apnea a. Patient on  BiPAP 3. GERD a. Protonix 4. Hypertension a. Changed to IV metoprolol 5. Elevated blood glucose a. Hemoglobin A1c b. CBGs before meals and nightly  DVT prophylaxis: Lovenox Consultants: None Code Status: Full code Family Communication: None available Disposition Plan: Pending   Truett Mainland, DO  ADDENDUM:  ABG    Component Value Date/Time   PHART 7.197 (LL) 06/17/2020  1829   PCO2ART 67.4 (HH) 06/17/2020 1829   PO2ART 93.8 06/17/2020 1829   HCO3 21.0 06/17/2020 1829   ACIDBASEDEF 2.0 06/17/2020 1829   O2SAT 95.1 06/17/2020 1829   As patient deteriorating, will have EDP intubate and place central line. Fentanyl for sedation Levophed if needed for hypotension.  Truett Mainland, DO

## 2020-06-17 NOTE — Progress Notes (Signed)
Pharmacy Antibiotic Note  [name] is a 75 y.o. female admitted on 06/17/2020 with aspiration pneumonia.  Pharmacy has been consulted for Unasyn dosing.  Pt received Zosyn 4.5gm in ED  Plan: Unasyn 1.5gm IV q6hrs (start 8 hours after Zosyn dose per MD)  Height: 5\' 4"  (162.6 cm) Weight: 81.6 kg (180 lb) IBW/kg (Calculated) : 54.7  Temp (24hrs), Avg:98.1 F (36.7 C), Min:97.6 F (36.4 C), Max:98.6 F (37 C)  Recent Labs  Lab 06/17/20 1549 06/17/20 1735  WBC 17.3*  --   CREATININE 1.04*  --   LATICACIDVEN 2.6* 1.9    Estimated Creatinine Clearance: 49.8 mL/min (A) (by C-G formula based on SCr of 1.04 mg/dL (H)).    No Known Allergies  Antimicrobials this admission:   >>    >>   Dose adjustments this admission:   Microbiology results:  BCx:   UCx:    Sputum:    MRSA PCR:   Thank you for allowing pharmacy to be a part of this patient's care.  Hart Robinsons A 06/17/2020 6:32 PM

## 2020-06-17 NOTE — ED Triage Notes (Addendum)
Pt's husband just passed yesterday. Family left pt at home at 1900 last night. Called to check on pt this morning, no answer and family broke into home. Pt was altered per family, vomited and weak.    LKW at 1900 last night by family. States had 3 beers last night and a shot of rum

## 2020-06-17 NOTE — ED Notes (Signed)
Color change 22 left lip, 7.5 ETT.

## 2020-06-17 NOTE — ED Notes (Signed)
Family at bedside. 

## 2020-06-17 NOTE — ED Notes (Signed)
Pt unable to sign waiver due to AMS

## 2020-06-18 ENCOUNTER — Other Ambulatory Visit: Payer: Self-pay

## 2020-06-18 ENCOUNTER — Inpatient Hospital Stay (HOSPITAL_COMMUNITY): Payer: Medicare Other

## 2020-06-18 DIAGNOSIS — J9602 Acute respiratory failure with hypercapnia: Secondary | ICD-10-CM

## 2020-06-18 DIAGNOSIS — A419 Sepsis, unspecified organism: Secondary | ICD-10-CM | POA: Diagnosis not present

## 2020-06-18 DIAGNOSIS — J690 Pneumonitis due to inhalation of food and vomit: Secondary | ICD-10-CM | POA: Diagnosis not present

## 2020-06-18 DIAGNOSIS — J9601 Acute respiratory failure with hypoxia: Secondary | ICD-10-CM

## 2020-06-18 DIAGNOSIS — R6520 Severe sepsis without septic shock: Secondary | ICD-10-CM

## 2020-06-18 LAB — CBC
HCT: 36.3 % (ref 36.0–46.0)
Hemoglobin: 12.1 g/dL (ref 12.0–15.0)
MCH: 32.3 pg (ref 26.0–34.0)
MCHC: 33.3 g/dL (ref 30.0–36.0)
MCV: 96.8 fL (ref 80.0–100.0)
Platelets: 223 10*3/uL (ref 150–400)
RBC: 3.75 MIL/uL — ABNORMAL LOW (ref 3.87–5.11)
RDW: 12.4 % (ref 11.5–15.5)
WBC: 11.8 10*3/uL — ABNORMAL HIGH (ref 4.0–10.5)
nRBC: 0 % (ref 0.0–0.2)

## 2020-06-18 LAB — GLUCOSE, CAPILLARY
Glucose-Capillary: 161 mg/dL — ABNORMAL HIGH (ref 70–99)
Glucose-Capillary: 134 mg/dL — ABNORMAL HIGH (ref 70–99)
Glucose-Capillary: 120 mg/dL — ABNORMAL HIGH (ref 70–99)
Glucose-Capillary: 114 mg/dL — ABNORMAL HIGH (ref 70–99)

## 2020-06-18 LAB — BLOOD GAS, ARTERIAL
Acid-base deficit: 0.6 mmol/L (ref 0.0–2.0)
Bicarbonate: 23.8 mmol/L (ref 20.0–28.0)
FIO2: 50
O2 Saturation: 93.6 %
Patient temperature: 37
pCO2 arterial: 39.2 mmHg (ref 32.0–48.0)
pH, Arterial: 7.397 (ref 7.350–7.450)
pO2, Arterial: 66.3 mmHg — ABNORMAL LOW (ref 83.0–108.0)

## 2020-06-18 LAB — HEMOGLOBIN A1C
Hgb A1c MFr Bld: 6.5 % — ABNORMAL HIGH (ref 4.8–5.6)
Mean Plasma Glucose: 139.85 mg/dL

## 2020-06-18 LAB — COMPREHENSIVE METABOLIC PANEL
ALT: 66 U/L — ABNORMAL HIGH (ref 0–44)
AST: 71 U/L — ABNORMAL HIGH (ref 15–41)
Albumin: 3.1 g/dL — ABNORMAL LOW (ref 3.5–5.0)
Alkaline Phosphatase: 62 U/L (ref 38–126)
Anion gap: 11 (ref 5–15)
BUN: 19 mg/dL (ref 8–23)
CO2: 22 mmol/L (ref 22–32)
Calcium: 8.6 mg/dL — ABNORMAL LOW (ref 8.9–10.3)
Chloride: 105 mmol/L (ref 98–111)
Creatinine, Ser: 0.99 mg/dL (ref 0.44–1.00)
GFR, Estimated: >60 mL/min (ref >60)
Glucose, Bld: 154 mg/dL — ABNORMAL HIGH (ref 70–99)
Potassium: 3.6 mmol/L (ref 3.5–5.1)
Sodium: 138 mmol/L (ref 135–145)
Total Bilirubin: 0.8 mg/dL (ref 0.3–1.2)
Total Protein: 5.4 g/dL — ABNORMAL LOW (ref 6.5–8.1)

## 2020-06-18 LAB — TRIGLYCERIDES: Triglycerides: 137 mg/dL (ref <150)

## 2020-06-18 MED ORDER — LORAZEPAM 2 MG/ML IJ SOLN: 1.0000 mg | Freq: Four times a day (QID) | INTRAMUSCULAR | Status: DC | PRN

## 2020-06-18 MED ORDER — SODIUM CHLORIDE 0.9 % IV SOLN: INTRAVENOUS | Status: DC

## 2020-06-18 MED ORDER — ASPIRIN 81 MG PO CHEW: 81.0000 mg | CHEWABLE_TABLET | Freq: Every day | ORAL | Status: DC

## 2020-06-18 MED ORDER — DULOXETINE HCL 60 MG PO CPEP
60.0000 mg | ORAL_CAPSULE | Freq: Every day | ORAL | Status: DC
  Administered 2020-06-19: 60 mg via ORAL
  Filled 2020-06-18: qty 1

## 2020-06-18 MED ORDER — ASPIRIN EC 81 MG PO TBEC
81.0000 mg | DELAYED_RELEASE_TABLET | Freq: Every day | ORAL | Status: DC
  Administered 2020-06-19: 81 mg via ORAL
  Filled 2020-06-18: qty 1

## 2020-06-18 MED ORDER — FENTANYL CITRATE (PF) 100 MCG/2ML IJ SOLN: 25.0000 ug | INTRAMUSCULAR | Status: DC | PRN

## 2020-06-18 MED ORDER — METOPROLOL TARTRATE 25 MG PO TABS
25.0000 mg | ORAL_TABLET | Freq: Two times a day (BID) | ORAL | Status: DC
  Administered 2020-06-18 – 2020-06-19 (×2): 25 mg via ORAL
  Filled 2020-06-18 (×2): qty 1

## 2020-06-18 MED ORDER — SODIUM CHLORIDE 0.9 % IV SOLN
3.0000 g | Freq: Four times a day (QID) | INTRAVENOUS | Status: DC
  Administered 2020-06-18 – 2020-06-19 (×4): 3 g via INTRAVENOUS
  Filled 2020-06-18 (×5): qty 8
  Filled 2020-06-18 (×2): qty 3
  Filled 2020-06-18 (×2): qty 8

## 2020-06-18 MED ORDER — SIMVASTATIN 20 MG PO TABS: 20.0000 mg | ORAL_TABLET | Freq: Every day | ORAL | Status: DC

## 2020-06-18 MED ORDER — PANTOPRAZOLE SODIUM 40 MG PO TBEC
40.0000 mg | DELAYED_RELEASE_TABLET | Freq: Every day | ORAL | Status: DC
  Administered 2020-06-19: 40 mg via ORAL
  Filled 2020-06-18: qty 1

## 2020-06-18 MED ORDER — EZETIMIBE 10 MG PO TABS: 10.0000 mg | ORAL_TABLET | Freq: Every day | ORAL | Status: DC

## 2020-06-18 MED ORDER — BUSPIRONE HCL 5 MG PO TABS
10.0000 mg | ORAL_TABLET | Freq: Three times a day (TID) | ORAL | Status: DC
  Administered 2020-06-18: 10 mg via ORAL
  Filled 2020-06-18 (×2): qty 2

## 2020-06-18 MED ORDER — FOLIC ACID 1 MG PO TABS
1.0000 mg | ORAL_TABLET | Freq: Every day | ORAL | Status: DC
  Administered 2020-06-19: 1 mg via ORAL
  Filled 2020-06-18: qty 1

## 2020-06-18 MED ORDER — IPRATROPIUM-ALBUTEROL 0.5-2.5 (3) MG/3ML IN SOLN: 3.0000 mL | RESPIRATORY_TRACT | Status: DC | PRN

## 2020-06-18 MED ORDER — EZETIMIBE-SIMVASTATIN 10-20 MG PO TABS: 1.0000 | ORAL_TABLET | Freq: Every day | ORAL | Status: DC

## 2020-06-18 MED ORDER — ACETAMINOPHEN 325 MG PO TABS
650.0000 mg | ORAL_TABLET | Freq: Four times a day (QID) | ORAL | Status: DC | PRN
  Administered 2020-06-18: 650 mg via ORAL
  Filled 2020-06-18: qty 2

## 2020-06-18 MED ORDER — THIAMINE HCL 100 MG PO TABS
100.0000 mg | ORAL_TABLET | Freq: Every day | ORAL | Status: DC
  Administered 2020-06-19: 100 mg via ORAL
  Filled 2020-06-18 (×2): qty 1

## 2020-06-18 NOTE — Progress Notes (Signed)
Asked patient about wearing CPAP tonight, patient stated that she did not want to.  No distress was noted at this time.

## 2020-06-18 NOTE — Progress Notes (Signed)
Patient Demographics:    [name], is a 75 y.o. female, DOB - 07/20/1945, BDZ:[id number]  Admit date - 06/17/2020   Admitting Physician Truett Mainland, DO  Outpatient Primary MD for the patient is Selinda Orion  LOS - 1  Chief Complaint  Patient presents with  . Altered Mental Status  . Weakness        Subjective:    [name] today has no fevers, no emesis,  No chest pain,   Son and daughter in law at bedside Pt did well with sedation vacation and was extubated at 73 PM -- Currently on 3L/min,   Assessment  & Plan :    Principal Problem:   Severe sepsis due to aspiration pneumonia with acute hypoxic and hypercapnic respiratory failure Active Problems:   Acute respiratory failure with hypoxia and hypercapnia (HCC)   Aspiration pneumonia of both lower lobes due to gastric secretions (HCC)   Essential hypertension   GASTROESOPHAGEAL REFLUX DISEASE   Obstructive sleep apnea   HYPERCHOLESTEROLEMIA  Brief Summary:- -75 y.o. female with a history of hypertension, GERD, hyperlipidemia, depression/anxiety admitted with episodes of emesis and Acute Resp Failure with Hypoxia and Hypercapnia--required intubation due to persistent respiratory acidosis despite BiPAP, successfully extubated on 06/18/2020  A/p 1)Acute Resp Failure with Hypoxia and Hypercapnia--due to aspiration pneumonia Pt did well with sedation vacation and was extubated at 43 PM -- Currently on 3L/min--  2)Severe sepsis due to aspiration pneumonia--POA, patient met criteria for severe sepsis on admission with aspiration pneumonia, tachycardia, leukocytosis tachypnea hypoxia requiring intubation and ventilation as well as metabolic encephalopathy/altered mentation -IV Unasyn, bronchodilators and mucolytics as ordered -WBC is down to 11.8 from 17.7 -Hypoxia and respiratory failure improving   3)Presumed OSA--CPAP  nightly  4)HLD--restart Vytorin  5) depression and anxiety--patient's husband passed away on 06-18-2020 patient is grieving -Restart Cymbalta and BuSpar, may use lorazepam as needed  6) history of EtOH use----apparently she is not a heavy drinker but drinks frequently -Watch for DTs, give folic acid and thiamine -As needed lorazepam  Disposition/Need for in-Hospital Stay- patient unable to be discharged at this time due to --sepsis secondary to aspiration pneumonia with acute on chronic hypoxic and hypercapnic respiratory failure quiring IV antibiotics and respiratory support  Status is: Inpatient  Remains inpatient appropriate because:Please see disposition above   Disposition: The patient is from: Home              Anticipated d/c is to: TBD--pending PT eval               Anticipated d/c date is: 2 days              Patient currently is not medically stable to d/c. Barriers: Not Clinically Stable-   Code Status :  -  Code Status: Full Code   Family Communication: Discussed with son and daughter-in-law at bedside   Consults  :  PCCM  DVT Prophylaxis  :   - SCDs    enoxaparin (LOVENOX) injection 40 mg Start: 06/17/20 2200   Lab Results  Component Value Date   PLT 223 06/18/2020    Inpatient Medications  Scheduled Meds: . [START ON 06/19/2020] aspirin EC  81 mg Oral Q breakfast  .  busPIRone  10 mg Oral TID  . Chlorhexidine Gluconate Cloth  6 each Topical Daily  . DULoxetine  60 mg Oral Daily  . enoxaparin (LOVENOX) injection  40 mg Subcutaneous Q24H  . [START ON 06/19/2020] ezetimibe  10 mg Oral q1800   And  . [START ON 06/19/2020] simvastatin  20 mg Oral q1800  . folic acid  1 mg Oral Daily  . insulin aspart  0-5 Units Subcutaneous QHS  . insulin aspart  0-9 Units Subcutaneous TID WC  . metoprolol tartrate  25 mg Oral BID  . pantoprazole  40 mg Oral Daily  . thiamine  100 mg Oral Daily   Continuous Infusions: . sodium chloride 75 mL/hr at 06/18/20 1537  .  ampicillin-sulbactam (UNASYN) IV 3 g (06/18/20 1538)   PRN Meds:.ipratropium-albuterol, LORazepam, ondansetron **OR** ondansetron (ZOFRAN) IV   Anti-infectives (From admission, onward)   Start     Dose/Rate Route Frequency Ordered Stop   06/18/20 0900  Ampicillin-Sulbactam (UNASYN) 3 g in sodium chloride 0.9 % 100 mL IVPB        3 g 200 mL/hr over 30 Minutes Intravenous Every 6 hours 06/18/20 0749     06/18/20 0200  ampicillin-sulbactam (UNASYN) 1.5 g in sodium chloride 0.9 % 100 mL IVPB  Status:  Discontinued        1.5 g 200 mL/hr over 30 Minutes Intravenous Every 6 hours 06/17/20 1830 06/18/20 0749   06/17/20 1700  piperacillin-tazobactam (ZOSYN) IVPB 4.5 g        4.5 g 200 mL/hr over 30 Minutes Intravenous  Once 06/17/20 1657 06/17/20 1848   06/17/20 1545  cefTRIAXone (ROCEPHIN) 2 g in sodium chloride 0.9 % 100 mL IVPB        2 g 200 mL/hr over 30 Minutes Intravenous  Once 06/17/20 1539 06/17/20 1639   06/17/20 1545  azithromycin (ZITHROMAX) 500 mg in sodium chloride 0.9 % 250 mL IVPB  Status:  Discontinued        500 mg 250 mL/hr over 60 Minutes Intravenous Every 24 hours 06/17/20 1539 06/17/20 1822        Objective:   Vitals:   06/18/20 1300 06/18/20 1400 06/18/20 1500 06/18/20 1609  BP: (!) 126/44 (!) 123/32 (!) 126/47   Pulse: 92 86 (!) 118   Resp: (!) 28 (!) 28 16   Temp:    98.3 F (36.8 C)  TempSrc:    Oral  SpO2: 98% 98% 93%   Weight:      Height:        Wt Readings from Last 3 Encounters:  06/17/20 88.2 kg  12/30/19 81.6 kg  09/16/19 89.6 kg    Intake/Output Summary (Last 24 hours) at 06/18/2020 1733 Last data filed at 06/18/2020 1434 Gross per 24 hour  Intake 1793.42 ml  Output 1045 ml  Net 748.42 ml   Physical Exam  Gen:-Sleepy post extubation  HEENT:- [place].AT, No sclera icterus, ET tube and OG tube removed Neck-Supple Neck,No JVD,.  Lungs-diminished breath sounds, scattered rhonchi  CV- S1, S2 normal, regular  Abd-  +ve B.Sounds, Abd Soft, No  tenderness,    Extremity/Skin:- No  edema, pedal pulses present  Neuro-Psych-waking up more postextubation, generalized weakness without new focal deficits GU-Foley cath to be removed once able to get out of bed post extubation   Data Review:   Micro Results Recent Results (from the past 240 hour(s))  Resp Panel by RT-PCR (Flu A&B, Covid) Nasopharyngeal Swab     Status: None  Collection Time: 06/17/20  3:10 PM   Specimen: Nasopharyngeal Swab; Nasopharyngeal(NP) swabs in vial transport medium  Result Value Ref Range Status   SARS Coronavirus 2 by RT PCR NEGATIVE NEGATIVE Final    Comment: (NOTE) SARS-CoV-2 target nucleic acids are NOT DETECTED.  The SARS-CoV-2 RNA is generally detectable in upper respiratory specimens during the acute phase of infection. The lowest concentration of SARS-CoV-2 viral copies this assay can detect is 138 copies/mL. A negative result does not preclude SARS-Cov-2 infection and should not be used as the sole basis for treatment or other patient management decisions. A negative result may occur with  improper specimen collection/handling, submission of specimen other than nasopharyngeal swab, presence of viral mutation(s) within the areas targeted by this assay, and inadequate number of viral copies(<138 copies/mL). A negative result must be combined with clinical observations, patient history, and epidemiological information. The expected result is Negative.  Fact Sheet for Patients:  EntrepreneurPulse.com.au  Fact Sheet for Healthcare Providers:  IncredibleEmployment.be  This test is no t yet approved or cleared by the [place] FDA and  has been authorized for detection and/or diagnosis of SARS-CoV-2 by FDA under an Emergency Use Authorization (EUA). This EUA will remain  in effect (meaning this test can be used) for the duration of the COVID-19 declaration under Section 564(b)(1) of the Act, 21 U.S.C.section  360bbb-3(b)(1), unless the authorization is terminated  or revoked sooner.       Influenza A by PCR NEGATIVE NEGATIVE Final   Influenza B by PCR NEGATIVE NEGATIVE Final    Comment: (NOTE) The Xpert Xpress SARS-CoV-2/FLU/RSV plus assay is intended as an aid in the diagnosis of influenza from Nasopharyngeal swab specimens and should not be used as a sole basis for treatment. Nasal washings and aspirates are unacceptable for Xpert Xpress SARS-CoV-2/FLU/RSV testing.  Fact Sheet for Patients: EntrepreneurPulse.com.au  Fact Sheet for Healthcare Providers: IncredibleEmployment.be  This test is not yet approved or cleared by the [place] FDA and has been authorized for detection and/or diagnosis of SARS-CoV-2 by FDA under an Emergency Use Authorization (EUA). This EUA will remain in effect (meaning this test can be used) for the duration of the COVID-19 declaration under Section 564(b)(1) of the Act, 21 U.S.C. section 360bbb-3(b)(1), unless the authorization is terminated or revoked.  Performed at [facility], [address] [place], [place] [address]   Culture, blood (routine x 2)     Status: None (Preliminary result)   Collection Time: 06/17/20  3:59 PM   Specimen: BLOOD LEFT HAND  Result Value Ref Range Status   Specimen Description BLOOD LEFT HAND  Final   Special Requests   Final    Blood Culture results may not be optimal due to an inadequate volume of blood received in culture bottles BOTTLES DRAWN AEROBIC AND ANAEROBIC Performed at [facility], [address], [place], [place] [address]    Culture PENDING  Incomplete   Report Status PENDING  Incomplete  Culture, blood (routine x 2)     Status: None (Preliminary result)   Collection Time: 06/17/20  5:40 PM   Specimen: BLOOD LEFT HAND  Result Value Ref Range Status   Specimen Description BLOOD LEFT HAND  Final   Special Requests   Final    Blood Culture adequate volume BOTTLES  DRAWN AEROBIC ONLY Performed at [facility], [address], [place], [place] [address]    Culture PENDING  Incomplete   Report Status PENDING  Incomplete  MRSA PCR Screening  Status: None   Collection Time: 06/17/20  8:34 PM   Specimen: Nasal Mucosa; Nasopharyngeal  Result Value Ref Range Status   MRSA by PCR NEGATIVE NEGATIVE Final    Comment:        The GeneXpert MRSA Assay (FDA approved for NASAL specimens only), is one component of a comprehensive MRSA colonization surveillance program. It is not intended to diagnose MRSA infection nor to guide or monitor treatment for MRSA infections. Performed at [facility], [address] [place], [place] [address]     Radiology Reports DG CHEST PORT 1 VIEW  Result Date: 06/18/2020 CLINICAL DATA:  Respiratory abnormality.  Patient on ventilator. EXAM: PORTABLE CHEST 1 VIEW COMPARISON:  06/17/2020 FINDINGS: ET tube tip is above the carina. Nasogastric tube and side port are below the GE junction. Heart size is normal. No pleural effusion or edema. No airspace densities. IMPRESSION: 1. Stable position of support apparatus. 2. No focal airspace opacities identified. Electronically Signed   By: Kerby Moors M.D.   On: 06/18/2020 11:02   DG Chest Portable 1 View  Result Date: 06/17/2020 CLINICAL DATA:  Weakness, vomiting and altered mental status. EXAM: PORTABLE CHEST 1 VIEW COMPARISON:  Sent 11/14/2018 FINDINGS: The cardiac silhouette, mediastinal and hilar contours are within normal limits and stable. Stable eventration of the right hemidiaphragm. Underlying bronchitic changes with superimposed patchy bilateral infiltrates. No pleural effusions. No pneumothorax. The bony thorax is intact. IMPRESSION: Patchy bilateral infiltrates. Electronically Signed   By: Marijo Sanes M.D.   On: 06/17/2020 15:18   DG Chest Port 1V same Day  Result Date: 06/17/2020 CLINICAL DATA:  Intubation. EXAM: PORTABLE CHEST 1 VIEW COMPARISON:  Earlier today.  FINDINGS: Endotracheal tube tip at the level of the clavicular heads 4.2 cm from the carina. Enteric tube is in place with tip below the diaphragm not included in the field of view. Again seen elevation of right hemidiaphragm. Improvement in patchy bibasilar airspace disease, residual in the perihilar regions and left lung base. Stable heart size and mediastinal contours. Aortic tortuosity. No pleural fluid or pneumothorax. IMPRESSION: 1. Endotracheal tube tip at the level of the clavicular heads 4.2 cm from the carina. Enteric tube in place with tip below the diaphragm not included in the field of view. 2. Improved lung aeration with decreasing patchy bibasilar opacities, greatest residual in the perihilar region and left lung base. Electronically Signed   By: Keith Rake M.D.   On: 06/17/2020 21:20     CBC Recent Labs  Lab 06/17/20 1549 06/18/20 0538  WBC 17.3* 11.8*  HGB 15.0 12.1  HCT 44.3 36.3  PLT 292 223  MCV 96.7 96.8  MCH 32.8 32.3  MCHC 33.9 33.3  RDW 12.1 12.4    Chemistries  Recent Labs  Lab 06/17/20 1549 06/18/20 0538  NA 139 138  K 4.5 3.6  CL 103 105  CO2 27 22  GLUCOSE 156* 154*  BUN 19 19  CREATININE 1.04* 0.99  CALCIUM 9.5 8.6*  AST  --  71*  ALT  --  66*  ALKPHOS  --  62  BILITOT  --  0.8   ------------------------------------------------------------------------------------------------------------------ Recent Labs    06/18/20 0538  TRIG 137    Lab Results  Component Value Date   HGBA1C 6.5 (H) 06/17/2020   ------------------------------------------------------------------------------------------------------------------ No results for input(s): TSH, T4TOTAL, T3FREE, THYROIDAB in the last 72 hours.  Invalid input(s): FREET3 ------------------------------------------------------------------------------------------------------------------ No results for input(s): VITAMINB12, FOLATE, FERRITIN, TIBC, IRON, RETICCTPCT in the last 72  hours.  Coagulation profile No results for input(s): INR, PROTIME in the last 168 hours.  No results for input(s): DDIMER in the last 72 hours.  Cardiac Enzymes No results for input(s): CKMB, TROPONINI, MYOGLOBIN in the last 168 hours.  Invalid input(s): CK ------------------------------------------------------------------------------------------------------------------    Component Value Date/Time   BNP 120.0 (H) 06/17/2020 1549     Roxan Hockey M.D on 06/18/2020 at 5:33 PM  Go to [email or web address] - for contact info  [facility] - Office  [phone]

## 2020-06-18 NOTE — Progress Notes (Signed)
Patient waking up on current sedation, following commands and nods yes/no. Son and Daughter-in-law are at bedside. Updated on plan of care today. Patients vitals are stable at this time

## 2020-06-18 NOTE — Progress Notes (Signed)
Alert and oriented x2-3. Son at bedside. Patient given I.S and encouraged to cough and deep breath. Able to make needs known. Call light in reach.

## 2020-06-18 NOTE — Progress Notes (Signed)
At 1447hr patient was extubated to 3L O2 via nasal cannula. RN and MD at bedside, no complications noted. Patient HR 112, RR 18, SATs 95%.

## 2020-06-18 NOTE — Consult Note (Signed)
[facility]   Patient name: [name] Admit date: 06/17/2020  DOB: October 29, 1945 LOS: 1  MRN: [id number] Consult date: 06/18/2020  Referring provider: Dr. Denton Brick, [facility]: Syncope    History:  75 yo female presented to [facility] ER on 06/17/20 being found by family passed out.  Her husband passed away on 06-18-2020 after being enrolled in home hospice.  She told her son she was going to take a xanax before going to sleep.  Last know normal time was around 10 pm on June 18, 2020.  In AM of 06/17/20 her son tried calling her, but no answer.  He went to her house and through the window say her laying on the floor.  He broke into her house and she had vomit on her chest.  She was very lethargic, but he was able to arouse her some.  She reported drinking 3 beers prior to going to sleep.  He called EMS and she was brought to the hospital.  In ER her SpO2 was 52% on room air.  She was found to have bilateral pulmonary infiltrates concerning for aspiration pneumonia.  She was tried on bipap but had persistent hypoxia and hypercapnia.  She required intubation and mechanical ventilation.  Past medical history:  HTN, GERD, HLD, Anxiety, Depression  Significant events:  4/24 Admit  Studies:    Micro:  COVID/Flu 4/24 >> negative Blood 4/24 >>   Lines:  ETT 4/24 >>    Antibiotics:  Zosyn 4/24 Rocephin 4/24  Zithromax 4/24 Unasyn 4/24 >>   Consults:      Interim history:    Vital signs:  BP (!) 126/44   Pulse 92   Temp 98.9 F (37.2 C) (Axillary)   Resp (!) 28   Ht 5\' 5"  (1.651 m)   Wt 88.2 kg   SpO2 98%   BMI 32.36 kg/m   Intake/output:  I/O last 3 completed shifts: In: 2660.8 [I.V.:110.8; IV Piggyback:2550] Out: 76 [Urine:720]   Physical exam:   General - sedated Eyes - pupils reactive ENT - ETT in place Cardiac - regular rate/rhythm, no murmur Chest - b/l rhonchi, no wheeze Abdomen - soft, non tender, + bowel  sounds Extremities - no cyanosis, clubbing, or edema Skin - no rashes Neuro - RASS -3  Best practice:   DVT - lovenox SUP - protonix Nutrition - NPO Mobility - bed rest   Assessment/plan:   Acute hypoxic/hypercapnic respiratory failure secondary to accidental overdose on xanax and alcohol complicated by aspiration pneumonia. - will assess for vent weaning - day 2 of ABx, currently on unasyn - f/u CXR intermittently - goal SpO2 > 92% - prn BDs  Acute metabolic encephalopathy from hypoxia, hypercapnia. Hx of depression and anxiety. - RASS goal 0 to -1 - hold outpt buspar, cymbalta - she will need education about the dangers of mixing alcohol with sedative medications  Hx of HTN, HLD. - continue ASA - hold outpt norvasc, toprol xl, vytorin  Hyperglycemia with reported hx of pre-diabetes. - SSI  Resolved hospital problems:  Lactic acidosis  Goals of care/Family discussions:  Code status: full code  Updated pt's son and daughter in law at bedside  Labs:   CMP Latest Ref Rng & Units 06/18/2020 06/17/2020 09/16/2019  Glucose 70 - 99 mg/dL 154(H) 156(H) 92  BUN 8 - 23 mg/dL 19 19 19   Creatinine 0.44 - 1.00 mg/dL 0.99 1.04(H) 1.08(H)  Sodium 135 - 145 mmol/L 138 139 139  Potassium 3.5 -  5.1 mmol/L 3.6 4.5 4.2  Chloride 98 - 111 mmol/L 105 103 102  CO2 22 - 32 mmol/L 22 27 23   Calcium 8.9 - 10.3 mg/dL 8.6(L) 9.5 10.1  Total Protein 6.5 - 8.1 g/dL 5.4(L) - -  Total Bilirubin 0.3 - 1.2 mg/dL 0.8 - -  Alkaline Phos 38 - 126 U/L 62 - -  AST 15 - 41 U/L 71(H) - -  ALT 0 - 44 U/L 66(H) - -    CBC Latest Ref Rng & Units 06/18/2020 06/17/2020 09/16/2019  WBC 4.0 - 10.5 K/uL 11.8(H) 17.3(H) 11.0(H)  Hemoglobin 12.0 - 15.0 g/dL 12.1 15.0 15.0  Hematocrit 36.0 - 46.0 % 36.3 44.3 43.2  Platelets 150 - 400 K/uL 223 292 259    ABG    Component Value Date/Time   PHART 7.397 06/18/2020 0210   PCO2ART 39.2 06/18/2020 0210   PO2ART 66.3 (L) 06/18/2020 0210   HCO3 23.8 06/18/2020  0210   ACIDBASEDEF 0.6 06/18/2020 0210   O2SAT 93.6 06/18/2020 0210    CBG (last 3)  Recent Labs    06/17/20 2154 06/18/20 0731 06/18/20 1130  GLUCAP 163* 161* 134*     Past surgical history:  She  has a past surgical history that includes Appendectomy (1984); Tubal ligation; Laparoscopic cholecystectomy (04/2004); laporoscopy (1977); Lateral collateral ligament repair, knee; Hand surgery; Total ankle replacement; ORIF tibia plateau (Right, 12/26/2013); Bladder suspension (SEVERAL YRS AGO); Hardware Removal (Right, 04/19/2014); Total knee arthroplasty (Right, 08/09/2014); Lumbar laminectomy/decompression microdiscectomy (N/A, 03/07/2015); and LEFT HEART CATH AND CORONARY ANGIOGRAPHY (N/A, 11/12/2016).  Social history:  She  reports that she has never smoked. She has never used smokeless tobacco. She reports current alcohol use of about 3.0 standard drinks of alcohol per week. She reports that she does not use drugs.   Review of systems:  Unable to obtain.  Family history:  Her family history includes Diabetes in her sister; Heart disease in her brother; Stomach cancer (age of onset: 12) in her brother.    Medications:   No current facility-administered medications on file prior to encounter.   Current Outpatient Medications on File Prior to Encounter  Medication Sig  . amLODipine (NORVASC) 10 MG tablet Take 10 mg by mouth every morning.  Marland Kitchen aspirin EC 81 MG tablet Take 81 mg by mouth daily.  . busPIRone (BUSPAR) 5 MG tablet Take 5 mg by mouth 2 (two) times daily as needed.  . calcium citrate-vitamin D (CITRACAL+D) 315-200 MG-UNIT per tablet Take 1 tablet by mouth every morning.  . Cholecalciferol (VITAMIN D3) 400 units CAPS Take 400 Units by mouth daily.  . DULoxetine (CYMBALTA) 30 MG capsule Take 60 mg by mouth daily.  . famotidine (PEPCID) 20 MG tablet Take 20 mg by mouth 2 (two) times daily.  . metoprolol succinate (TOPROL-XL) 25 MG 24 hr tablet Take 1 tablet (25 mg total) by  mouth daily. Please make overdue appt with Dr. Angelena Form before anymore refills. Thank you 2nd attempt  . Multiple Vitamin (MULTIVITAMIN WITH MINERALS) TABS tablet Take 1 tablet by mouth daily.  Marland Kitchen omeprazole (PRILOSEC) 20 MG capsule Take 20 mg by mouth daily.  . vitamin E 400 UNIT capsule Take 400 Units by mouth daily.  Marland Kitchen VYTORIN 10-40 MG per tablet Take 1 tablet by mouth at bedtime.     Critical care time: 35 minutes  Chesley Mires, MD [place] Pager - [phone] 06/18/2020, 2:26 PM

## 2020-06-19 DIAGNOSIS — A419 Sepsis, unspecified organism: Secondary | ICD-10-CM | POA: Diagnosis not present

## 2020-06-19 DIAGNOSIS — R6520 Severe sepsis without septic shock: Secondary | ICD-10-CM | POA: Diagnosis not present

## 2020-06-19 LAB — BASIC METABOLIC PANEL
Anion gap: 7 (ref 5–15)
BUN: 8 mg/dL (ref 8–23)
CO2: 27 mmol/L (ref 22–32)
Calcium: 8.5 mg/dL — ABNORMAL LOW (ref 8.9–10.3)
Chloride: 110 mmol/L (ref 98–111)
Creatinine, Ser: 0.6 mg/dL (ref 0.44–1.00)
GFR, Estimated: >60 mL/min (ref >60)
Glucose, Bld: 105 mg/dL — ABNORMAL HIGH (ref 70–99)
Potassium: 3.1 mmol/L — ABNORMAL LOW (ref 3.5–5.1)
Sodium: 144 mmol/L (ref 135–145)

## 2020-06-19 LAB — CBC
HCT: 35.2 % — ABNORMAL LOW (ref 36.0–46.0)
Hemoglobin: 11.8 g/dL — ABNORMAL LOW (ref 12.0–15.0)
MCH: 32.8 pg (ref 26.0–34.0)
MCHC: 33.5 g/dL (ref 30.0–36.0)
MCV: 97.8 fL (ref 80.0–100.0)
Platelets: 192 10*3/uL (ref 150–400)
RBC: 3.6 MIL/uL — ABNORMAL LOW (ref 3.87–5.11)
RDW: 12.4 % (ref 11.5–15.5)
WBC: 7.1 10*3/uL (ref 4.0–10.5)
nRBC: 0 % (ref 0.0–0.2)

## 2020-06-19 LAB — GLUCOSE, CAPILLARY
Glucose-Capillary: 103 mg/dL — ABNORMAL HIGH (ref 70–99)
Glucose-Capillary: 103 mg/dL — ABNORMAL HIGH (ref 70–99)

## 2020-06-19 MED ORDER — THIAMINE HCL 100 MG PO TABS: 100.0000 mg | ORAL_TABLET | Freq: Every day | ORAL | 2 refills | Status: DC

## 2020-06-19 MED ORDER — ALBUTEROL SULFATE HFA 108 (90 BASE) MCG/ACT IN AERS: 2.0000 | INHALATION_SPRAY | Freq: Four times a day (QID) | RESPIRATORY_TRACT | 2 refills | Status: DC | PRN

## 2020-06-19 MED ORDER — AMOXICILLIN-POT CLAVULANATE 875-125 MG PO TABS
1.0000 | ORAL_TABLET | Freq: Two times a day (BID) | ORAL | Status: DC
  Administered 2020-06-19: 1 via ORAL
  Filled 2020-06-19: qty 1

## 2020-06-19 MED ORDER — AMOXICILLIN-POT CLAVULANATE 875-125 MG PO TABS: 1.0000 | ORAL_TABLET | Freq: Two times a day (BID) | ORAL | 0 refills | Status: AC

## 2020-06-19 MED ORDER — ACETAMINOPHEN 325 MG PO TABS: 650.0000 mg | ORAL_TABLET | Freq: Four times a day (QID) | ORAL | 0 refills | Status: AC | PRN

## 2020-06-19 MED ORDER — PANTOPRAZOLE SODIUM 40 MG PO TBEC: 40.0000 mg | DELAYED_RELEASE_TABLET | Freq: Every day | ORAL | 3 refills | Status: DC

## 2020-06-19 MED ORDER — METOPROLOL SUCCINATE ER 25 MG PO TB24: 25.0000 mg | ORAL_TABLET | Freq: Every day | ORAL | 5 refills | Status: AC

## 2020-06-19 MED ORDER — BUSPIRONE HCL 10 MG PO TABS: 10.0000 mg | ORAL_TABLET | Freq: Two times a day (BID) | ORAL | 3 refills | Status: DC

## 2020-06-19 MED ORDER — POTASSIUM CHLORIDE CRYS ER 20 MEQ PO TBCR
40.0000 meq | EXTENDED_RELEASE_TABLET | ORAL | Status: DC
  Administered 2020-06-19: 40 meq via ORAL
  Filled 2020-06-19: qty 2

## 2020-06-19 MED ORDER — DULOXETINE HCL 60 MG PO CPEP: 60.0000 mg | ORAL_CAPSULE | Freq: Every day | ORAL | 2 refills | Status: AC

## 2020-06-19 MED ORDER — ASPIRIN 81 MG PO TBEC: 81.0000 mg | DELAYED_RELEASE_TABLET | Freq: Every day | ORAL | 11 refills | Status: AC

## 2020-06-19 MED ORDER — VYTORIN 10-40 MG PO TABS: 1.0000 | ORAL_TABLET | Freq: Every day | ORAL | 5 refills | Status: AC

## 2020-06-19 MED ORDER — AMLODIPINE BESYLATE 10 MG PO TABS: 10.0000 mg | ORAL_TABLET | Freq: Every day | ORAL | 3 refills | Status: DC

## 2020-06-19 MED ORDER — FOLIC ACID 1 MG PO TABS: 1.0000 mg | ORAL_TABLET | Freq: Every day | ORAL | 2 refills | Status: DC

## 2020-06-19 NOTE — Progress Notes (Addendum)
  SATURATION QUALIFICATIONS: (This note is used to comply with regulatory documentation for home oxygen)   Patient Saturations on Room Air at Rest = 94 %   Patient Saturations on Room Air while Ambulating = 86 %   Patient Saturations on 2 Liters of oxygen while Ambulating = 92 %     Patient needs continuous O2 at 2 L/min continuously via nasal cannula with humidifier, with gaseous portability and conserving device    Dx-COPD - Roxan Hockey, MD

## 2020-06-19 NOTE — Progress Notes (Signed)
No issues after extubation on 06/18/20.  [place] arranging for home oxygen.  She plans to follow up with her PCP to repeat chest xray and assess continued home oxygen needs.  She is to be discharged once home oxygen is set up.  Spoke with pts son.  Chesley Mires, MD [place] Pager - [phone] 06/19/2020, 2:31 PM

## 2020-06-19 NOTE — Progress Notes (Signed)
PT Cancellation Note  Patient Details Name: [name] MRN: [id number] DOB: 1946-01-21   Cancelled Treatment:    Reason Eval/Treat Not Completed: PT screened, no needs identified, will sign off. Per nursing, pt ambulates around ICU, has equipment at home, d/c home today and doesn't need PT. Will sign off at this time, please re-consult if needs arise.    Talbot Grumbling PT, DPT 06/19/20, 1:48 PM

## 2020-06-19 NOTE — Progress Notes (Signed)
Received referral from patient physician regarding spiritual needs of grief. Patient husband passed last week and was under Hospice care at home. Found patient laying on her back in hospital bed with eyes closed. She opened eyes and greeted Chaplain warmly into her room. Chaplain sat bedside and engaged her in reflection around her life, upbringing, spiritual heritage, and relationship with her husband. She shared openly today about her grief, how tired she has been as she has cared for her ailing husband for years now. She expressed how baffled she was that she didn't feel relief after he died and how painful it was to see him pass. She stated that she guesses that she felt she wanted to be with him and that is what led her to be hospitalized. We talked about how care giving can be hard on the body as well. She needed affirmation of her faith today and she talked openly about hoe God feels close by yet far away right now. She is grateful for the care she has been given and how attentive her youngest son, Elta [place]adel[place], has been and remains for her now that her husband has passed. Chaplain provided safe space for her to cry, reflect, and be authentic in her grief. Chaplain provided prayer to close the visit. Ms. Yearsley stated that she feels ready to bury her husband now and to move forward to do the things he would want her to do. Chaplain will remain available in order to provide spiritual support and to assess for spiritual need.

## 2020-06-19 NOTE — Evaluation (Signed)
Clinical/Bedside Swallow Evaluation Patient Details  Name: BERNIECE ABID MRN: [id number] Date of Birth: [birth year]  Today's Date: 06/19/2020 Time: SLP Start Time (ACUTE ONLY): 3151 SLP Stop Time (ACUTE ONLY): 1410 SLP Time Calculation (min) (ACUTE ONLY): 21 min  Past Medical History:  Past Medical History:  Diagnosis Date  . Allergic rhinitis, cause unspecified   . Arthritis   . Cancer ([place])    hx skin cancer  . Depression   . Esophageal stenosis   . GERD (gastroesophageal reflux disease)   . Hiatal hernia   . History of bronchitis as a child   . History of nonmelanoma skin cancer   . History of urinary tract infection   . Hyperlipidemia   . Leg fracture, right    2015  . Multiple falls   . Pneumonia    2015  . Unspecified essential hypertension    PT STOPPED BP MED WITH PCP KNOWLEDGE  . Wears glasses    Past Surgical History:  Past Surgical History:  Procedure Laterality Date  . APPENDECTOMY  1984  . BLADDER SUSPENSION  SEVERAL YRS AGO  . HAND SURGERY     trigger finger release bilat   . HARDWARE REMOVAL Right 04/19/2014   Procedure: HARDWARE REMOVAL FROM RIGHT TIBIA;  Surgeon: [name], MD;  Location: [facility];  Service: Orthopedics;  Laterality: Right;  . LAPAROSCOPIC CHOLECYSTECTOMY  04/2004  . laporoscopy  1977   ovarian cyst  . LATERAL COLLATERAL LIGAMENT REPAIR, KNEE     RT KNEE  . LEFT HEART CATH AND CORONARY ANGIOGRAPHY N/A 11/12/2016   Procedure: LEFT HEART CATH AND CORONARY ANGIOGRAPHY;  Surgeon: Burnell Blanks, MD;  Location: [facility];  Service: Cardiovascular;  Laterality: N/A;  . LUMBAR LAMINECTOMY/DECOMPRESSION MICRODISCECTOMY N/A 03/07/2015   Procedure: COMPLETE DECOMPRESSION/LUMBAR LAMINECTOMY L4 - L5 FOR STENOSIS 1 LEVEL;  Surgeon: Latanya Maudlin, MD;  Location: [facility];  Service: Orthopedics;  Laterality: N/A;  . ORIF TIBIA PLATEAU Right 12/26/2013   Procedure: OPEN REDUCTION INTERNAL FIXATION (ORIF) TIBIAL PLATEAU;  Surgeon:  [name], MD;  Location: [facility];  Service: Orthopedics;  Laterality: Right;  . TOTAL ANKLE REPLACEMENT     7 years ago; left   . TOTAL KNEE ARTHROPLASTY Right 08/09/2014   Procedure: RIGHT TOTAL KNEE ARTHROPLASTY;  Surgeon: Gaynelle Arabian, MD;  Location: [facility];  Service: Orthopedics;  Laterality: Right;  . TUBAL LIGATION     75 reversal in 81 and tubal in 59   HPI:  75 yo female presented to [facility] ER on 06/17/20 being found by family passed out.  Her husband passed away on 07-04-2020 after being enrolled in home hospice.  She told her son she was going to take a xanax before going to sleep.  Last know normal time was around 10 pm on 07/04/2020.  In AM of 06/17/20 her son tried calling her, but no answer.  He went to her house and through the window say her laying on the floor.  He broke into her house and she had vomit on her chest.  She was very lethargic, but he was able to arouse her some.  She reported drinking 3 beers prior to going to sleep.  He called EMS and she was brought to the hospital.  In ER her SpO2 was 52% on room air.  She was found to have bilateral pulmonary infiltrates concerning for aspiration pneumonia.  She was tried on bipap but had persistent hypoxia and hypercapnia.  She required  intubation and mechanical ventilation. Pt was extubated yesterday. BSE requested.   Assessment / Plan / Recommendation Clinical Impression  Clinical swallow evaluation completed at bedside. Oral motor examination is WNL with the exception of mild hoarse vocal quality likely due to recent intubation. Pt denies previous difficulty swallowing. She currently has aspiration PNA,however suspect this is from emesis prior to admission. Pt self presented regular textures and thin liquids and shows no signs or symptoms of aspiration and no reports of globus. Ok to advance to regular textures and thin liquids and no further SLP services indicated at this time. Above to RN and Pt. SLP Visit Diagnosis: Dysphagia,  unspecified (R13.10)    Aspiration Risk  No limitations    Diet Recommendation Regular;Thin liquid   Liquid Administration via: Cup;Straw Medication Administration: Whole meds with liquid Supervision: Patient able to self feed Postural Changes: Seated upright at 90 degrees;Remain upright for at least 30 minutes after po intake    Other  Recommendations Oral Care Recommendations: Oral care BID;Patient independent with oral care Other Recommendations: Clarify dietary restrictions   Follow up Recommendations None      Frequency and Duration            Prognosis Prognosis for Safe Diet Advancement: Good      Swallow Study   General Date of Onset: 06/17/20 HPI: 75 yo female presented to [facility] ER on 06/17/20 being found by family passed out.  Her husband passed away on 2020/06/20 after being enrolled in home hospice.  She told her son she was going to take a xanax before going to sleep.  Last know normal time was around 10 pm on 06/20/2020.  In AM of 06/17/20 her son tried calling her, but no answer.  He went to her house and through the window say her laying on the floor.  He broke into her house and she had vomit on her chest.  She was very lethargic, but he was able to arouse her some.  She reported drinking 3 beers prior to going to sleep.  He called EMS and she was brought to the hospital.  In ER her SpO2 was 52% on room air.  She was found to have bilateral pulmonary infiltrates concerning for aspiration pneumonia.  She was tried on bipap but had persistent hypoxia and hypercapnia.  She required intubation and mechanical ventilation. Pt was extubated yesterday. BSE requested. Type of Study: Bedside Swallow Evaluation Previous Swallow Assessment: N/A Diet Prior to this Study:  (full liquids) Temperature Spikes Noted: No Respiratory Status: Nasal cannula History of Recent Intubation: Yes Length of Intubations (days): 2 days Date extubated: 06/18/20 Behavior/Cognition:  Alert;Cooperative;Pleasant mood Oral Cavity Assessment: Within Functional Limits Oral Care Completed by SLP: Recent completion by staff Oral Cavity - Dentition: Adequate natural dentition Vision: Functional for self-feeding Self-Feeding Abilities: Able to feed self Patient Positioning: Upright in bed Baseline Vocal Quality: Hoarse (recent extubation) Volitional Cough: Strong Volitional Swallow: Able to elicit    Oral/Motor/Sensory Function Overall Oral Motor/Sensory Function: Within functional limits   Ice Chips Ice chips: Within functional limits Presentation: Spoon   Thin Liquid Thin Liquid: Within functional limits Presentation: Straw;Self Fed    Nectar Thick Nectar Thick Liquid: Not tested   Honey Thick Honey Thick Liquid: Not tested   Puree Puree: Not tested   Solid     Solid: Within functional limits Presentation: Self Fed     Thank you,  Genene Churn, [place]  Kemara Quigley 06/19/2020,2:14 PM

## 2020-06-19 NOTE — Progress Notes (Signed)
SLP Cancellation Note  Patient Details Name: [name] MRN: [id number] DOB: February 18, 1946   Cancelled treatment:       Reason Eval/Treat Not Completed: Other (comment) (Chaplain is in the room with Pt); SLP could not complete BSE at this time due to chaplain in with Pt. RN reports that Pt is likely to be discharged today. SLP will check back as schedule permits.   Thank you,  Genene Churn, [place]    [place] 06/19/2020, 1:25 PM

## 2020-06-19 NOTE — Progress Notes (Signed)
SATURATION QUALIFICATIONS: (This note is used to comply with regulatory documentation for home oxygen)  Patient Saturations on Room Air at Rest = 92%  Patient Saturations on Room Air while Ambulating =  86%  Patient Saturations on 3 Liters of oxygen while Ambulating = 90 %  Please briefly explain why patient needs home oxygen: Desaturation after 100 foot walk remained for over one minute during rest 88%. Oxygen saturations increased back to 96% when placed back on 3 lpm oxygen by nasal cannula.

## 2020-06-19 NOTE — Progress Notes (Signed)
Oxygen was ordered for patient but she was also discharged and she stated she did not want to wait for oxygen to arrive. Had removed all IV and central line accesses. Foley had already been removed. Patient had walked around square in ICU. Rolled to car  By wheelchair and son took patient home.
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
## 2020-06-19 NOTE — TOC Transition Note (Signed)
Transition of Care [facility]) - CM/SW Discharge Note   Patient Details  Name: [name] MRN: [id number] Date of Birth: [birth year]  [facility] CM/SW Contact:  Boneta Lucks, RN Phone Number: 06/19/2020, 1:31 PM   Clinical Narrative:   Patient medically ready for discharge requiring home oxygen. Qualifying note and orders have been placed.  Ashly with [facility] accepted the referral, has a driver in the area to deliver.     Final next level of care: Home/Self Care Barriers to Discharge: Barriers Resolved   Patient Goals and CMS Choice Patient states their goals for this hospitalization and ongoing recovery are:: to go home. CMS Medicare.gov Compare Post Acute Care list provided to:: Patient Choice offered to / list presented to : Patient  Discharge Placement         Name of family member notified: Family at the bedside Patient and family notified of of transfer: 06/19/20  Discharge Plan and Services             DME Arranged: Oxygen DME Agency: [facility] Date DME Agency Contacted: 06/19/20 Time DME Agency Contacted: [phone]      Readmission Risk Interventions Readmission Risk Prevention Plan 06/19/2020  Medication Screening Complete  Transportation Screening Complete  Some recent data might be hidden

## 2020-06-22 LAB — CULTURE, BLOOD (ROUTINE X 2)
Culture: NO GROWTH
Culture: NO GROWTH
Special Requests: ADEQUATE

## 2020-10-08 ENCOUNTER — Encounter: Payer: Self-pay | Admitting: Gastroenterology

## 2021-05-02 NOTE — Progress Notes (Deleted)
Cardiology Office Note:    Date:  05/02/2021   ID:  Marquasia, Schmieder 1945/10/30, MRN [id number]  PCP:  Selinda Orion   [facility] Providers Cardiologist:  Lauree Chandler, MD { Click to update primary MD,subspecialty MD or APP then REFRESH:1}    Referring MD: Aura Dials, PA-C   Chief Complaint:   History of Present Illness:    [name] is a 76 y.o. female with a hx of HTN, OSA, acute respiratory failure with hypoxia and hypercapnia due to aspiration pneumonia  Seen remotely by Dr. Radford Pax with normal stress test in 2009. She reestablished care with Dr. Angelena Form in 2018 for evaluation of chest pain. Echo 10/2016 revealed normal LVEF, LVH with small LVOT gradient, no SAM. Cardiac cath 10/2016 revealed no evidence of CAD and normal filling pressures.   She was last seen in our office 02/10/19 by Dr. Angelena Form at which time no changes were made and she was advised to follow-up in 12 months.   She was hospitalized 4/24-4/26/22 following an episode of being found unconscious with vomit on her chest. She reported she had drank 3 beers and a shot of liquor and was found the following morning by her son. He measured her O2 saturation which was 52% by pulse ox. Upon admission, she required intubation due to persistent respiratory acidosis despite BiPAP. She was extubated on 06/18/20 and discharged on home O2 at 2 L. She reported that she was grieving the death of her husband just prior to admission.      Past Medical History:  Diagnosis Date   Allergic rhinitis, cause unspecified    Arthritis    Cancer ([place])    hx skin cancer   Depression    Esophageal stenosis    GERD (gastroesophageal reflux disease)    Hiatal hernia    History of bronchitis as a child    History of nonmelanoma skin cancer    History of urinary tract infection    Hyperlipidemia    Leg fracture, right    2015   Multiple falls    Pneumonia    2015   Unspecified essential hypertension     PT STOPPED BP MED WITH PCP KNOWLEDGE   Wears glasses     Past Surgical History:  Procedure Laterality Date   APPENDECTOMY  1984   BLADDER SUSPENSION  SEVERAL YRS AGO   HAND SURGERY     trigger finger release bilat    HARDWARE REMOVAL Right 04/19/2014   Procedure: HARDWARE REMOVAL FROM RIGHT TIBIA;  Surgeon: [name], MD;  Location: [facility];  Service: Orthopedics;  Laterality: Right;   LAPAROSCOPIC CHOLECYSTECTOMY  04/2004   laporoscopy  1977   ovarian cyst   LATERAL COLLATERAL LIGAMENT REPAIR, KNEE     RT KNEE   LEFT HEART CATH AND CORONARY ANGIOGRAPHY N/A 11/12/2016   Procedure: LEFT HEART CATH AND CORONARY ANGIOGRAPHY;  Surgeon: Burnell Blanks, MD;  Location: [place] [facility];  Service: Cardiovascular;  Laterality: N/A;   LUMBAR LAMINECTOMY/DECOMPRESSION MICRODISCECTOMY N/A 03/07/2015   Procedure: COMPLETE DECOMPRESSION/LUMBAR LAMINECTOMY L4 - L5 FOR STENOSIS 1 LEVEL;  Surgeon: Latanya Maudlin, MD;  Location: [facility];  Service: Orthopedics;  Laterality: N/A;   ORIF TIBIA PLATEAU Right 12/26/2013   Procedure: OPEN REDUCTION INTERNAL FIXATION (ORIF) TIBIAL PLATEAU;  Surgeon: [name], MD;  Location: [facility];  Service: Orthopedics;  Laterality: Right;   TOTAL ANKLE REPLACEMENT     7 years ago; left  TOTAL KNEE ARTHROPLASTY Right 08/09/2014   Procedure: RIGHT TOTAL KNEE ARTHROPLASTY;  Surgeon: Gaynelle Arabian, MD;  Location: [facility];  Service: Orthopedics;  Laterality: Right;   TUBAL LIGATION     76 reversal in 82 and tubal in 86    Current Medications: No outpatient medications have been marked as taking for the 05/06/21 encounter (Appointment) with Ann Maki, Lanice Schwab, NP.     Allergies:   Patient has no known allergies.   Social History   Socioeconomic History   Marital status: Married    Spouse name: Not on file   Number of children: 1   Years of education: Not on file   Highest education level: Not on file  Occupational History   Occupation: Retired     Comment: Event organiser  Tobacco Use   Smoking status: Never   Smokeless tobacco: Never  Vaping Use   Vaping Use: Never used  Substance and Sexual Activity   Alcohol use: Yes    Alcohol/week: 3.0 standard drinks    Types: 3 Cans of beer per week    Comment: OCCASIONAL   Drug use: No   Sexual activity: Yes    Birth control/protection: None    Comment: not asked  Other Topics Concern   Not on file  Social History Narrative   Not on file   Social Determinants of Health   Financial Resource Strain: Not on file  Food Insecurity: Not on file  Transportation Needs: Not on file  Physical Activity: Not on file  Stress: Not on file  Social Connections: Not on file     Family History: The patient's ***family history includes Diabetes in her sister; Heart disease in her brother; Stomach cancer (age of onset: 41) in her brother. There is no history of Colon cancer.  ROS:   Please see the history of present illness.    *** All other systems reviewed and are negative.  Labs/Other Studies Reviewed:    The following studies were reviewed today:  VAS [place] LE (DVT) 9/2021Summary: RIGHT: - No evidence of common femoral vein obstruction. LEFT: - There is no evidence of deep vein thrombosis in the lower extremity. - There is no evidence of superficial venous thrombosis. - No cystic structure found in the popliteal fossa   LHC 10/2016  1. No angiographic evidence of CAD 2. Normal LV filling pressures   Recommendations: No further ischemic workup. Continue optimal BP control.   Echo 10/2016  Left ventricle:  There appears to be a small LVOT gradient although  SAM is not appreciated due to poor image quality. The cavity size  was normal. Wall thickness was increased in a pattern of moderate  LVH. Systolic function was normal. The estimated ejection fraction  was in the range of 60% to 65%. There was dynamic obstruction at  restin the outflow tract, with mid-cavity  obliteration, a peak  velocity of 288 cm/sec, and a peak gradient of 33 mm Hg. Wall  motion was normal; there were no regional wall motion  abnormalities. Doppler parameters are consistent with abnormal left  ventricular relaxation (grade 1 diastolic dysfunction).  Aortic valve:   Trileaflet; normal thickness leaflets. Mobility was  not restricted.  Doppler:  Transvalvular velocity was within the  normal range. There was no stenosis. There was no regurgitation.  Aorta:  The aorta was normal, not dilated, and non-diseased. Aortic  root: The aortic root was normal in size.  Mitral valve:   Mildly thickened leaflets . Mobility was not  restricted.  Doppler:  Transvalvular velocity was within the normal  range. There was no evidence for stenosis. There was trivial  regurgitation.    Peak gradient (D): 2 mm Hg.  Left atrium:  The atrium was normal in size.  Atrial septum:  No defect or patent foramen ovale was identified.  Right ventricle:  The cavity size was mildly dilated. Wall  thickness was normal. Systolic function was normal.  Pulmonic valve:    Doppler:  Transvalvular velocity was within the  normal range. There was no evidence for stenosis. There was mild  regurgitation.  Tricuspid valve:   Structurally normal valve.    Doppler:  Transvalvular velocity was within the normal range. There was mild  regurgitation.  Pulmonary artery:   The main pulmonary artery was normal-sized.  Systolic pressure was within the normal range.  Right atrium:  The atrium was normal in size.  Pericardium:  The pericardium was normal in appearance. There was  no pericardial effusion.  Systemic veins:  Inferior vena cava: The vessel was normal in size. The  respirophasic diameter changes were in the normal range (>= 50%),  consistent with normal central venous pressure.  Post procedure conclusions  Ascending Aorta:  - The aorta was normal, not dilated, and non-diseased.   Recent Labs: 06/17/2020: B  Natriuretic Peptide 120.0 06/18/2020: ALT 66 06/19/2020: BUN 8; Creatinine, Ser 0.60; Hemoglobin 11.8; Platelets 192; Potassium 3.1; Sodium 144  Recent Lipid Panel    Component Value Date/Time   CHOL  05/15/2007 0350    171        ATP III CLASSIFICATION:  <200     mg/dL   Desirable  200-239  mg/dL   Borderline High  >=240    mg/dL   High   TRIG 137 06/18/2020 0538   HDL 43 05/15/2007 0350   CHOLHDL 4.0 05/15/2007 0350   VLDL 22 05/15/2007 0350   LDLCALC (H) 05/15/2007 0350    106        Total Cholesterol/HDL:CHD Risk Coronary Heart Disease Risk Table                     Men   Women  1/2 Average Risk   3.4   3.3     Risk Assessment/Calculations:   {Does this patient have ATRIAL FIBRILLATION?:[id number]}       Physical Exam:    VS:  There were no vitals taken for this visit.    Wt Readings from Last 3 Encounters:  06/19/20 189 lb 6 oz (85.9 kg)  12/30/19 180 lb (81.6 kg)  09/16/19 197 lb 8 oz (89.6 kg)     GEN: *** Well nourished, well developed in no acute distress HEENT: Normal NECK: No JVD; No carotid bruits CARDIAC: ***RRR, no murmurs, rubs, gallops RESPIRATORY:  Clear to auscultation without rales, wheezing or rhonchi  ABDOMEN: Soft, non-tender, non-distended MUSCULOSKELETAL:  No edema; No deformity. *** pedal pulses, ***bilaterally SKIN: Warm and dry NEUROLOGIC:  Alert and oriented x 3 PSYCHIATRIC:  Normal affect   EKG:  EKG is *** ordered today.  The ekg ordered today demonstrates ***  Diagnoses:    No diagnosis found. Assessment and Plan:     ***          {Are you ordering a CV Procedure (e.g. stress test, cath, DCCV, TEE, etc)?   Press F2        :[id number]}    Medication Adjustments/Labs and Tests Ordered: Current medicines are reviewed at length with  the patient today.  Concerns regarding medicines are outlined above.  No orders of the defined types were placed in this encounter.  No orders of the defined types were placed in this  encounter.   There are no Patient Instructions on file for this visit.   Signed, Emmaline Life, NP  05/02/2021 9:06 AM    [place]

## 2021-05-06 ENCOUNTER — Encounter: Payer: Self-pay | Admitting: Nurse Practitioner

## 2021-05-06 ENCOUNTER — Other Ambulatory Visit: Payer: Self-pay

## 2021-05-06 ENCOUNTER — Ambulatory Visit (INDEPENDENT_AMBULATORY_CARE_PROVIDER_SITE_OTHER): Payer: Medicare Other | Admitting: Nurse Practitioner

## 2021-05-06 VITALS — BP 126/76 | HR 70 | Ht 64.0 in | Wt 195.2 lb

## 2021-05-06 DIAGNOSIS — E785 Hyperlipidemia, unspecified: Secondary | ICD-10-CM | POA: Diagnosis not present

## 2021-05-06 DIAGNOSIS — R0600 Dyspnea, unspecified: Secondary | ICD-10-CM

## 2021-05-06 DIAGNOSIS — I517 Cardiomegaly: Secondary | ICD-10-CM | POA: Diagnosis not present

## 2021-05-06 DIAGNOSIS — I10 Essential (primary) hypertension: Secondary | ICD-10-CM

## 2021-05-06 DIAGNOSIS — R600 Localized edema: Secondary | ICD-10-CM

## 2021-05-06 DIAGNOSIS — F4321 Adjustment disorder with depressed mood: Secondary | ICD-10-CM

## 2021-05-06 NOTE — Patient Instructions (Signed)
Medication Instructions:  ? ?Your physician recommends that you continue on your current medications as directed. Please refer to the Current Medication list given to you today. ? ? ?*If you need a refill on your cardiac medications before your next appointment, please call your pharmacy* ? ? ?Testing/Procedures: ? ?Your physician has requested that you have an echocardiogram. Echocardiography is a painless test that uses sound waves to create images of your heart. It provides your doctor with information about the size and shape of your heart and how well your heart?s chambers and valves are working. This procedure takes approximately one hour. There are no restrictions for this procedure. ? ? ? ?Follow-Up: ?At [facility], you and your health needs are our priority.  As part of our continuing mission to provide you with exceptional heart care, we have created designated Provider Care Teams.  These Care Teams include your primary Cardiologist (physician) and Advanced Practice Providers (APPs -  Physician Assistants and Nurse Practitioners) who all work together to provide you with the care you need, when you need it. ? ?We recommend signing up for the patient portal called "MyChart".  Sign up information is provided on this After Visit Summary.  MyChart is used to connect with patients for Virtual Visits (Telemedicine).  Patients are able to view lab/test results, encounter notes, upcoming appointments, etc.  Non-urgent messages can be sent to your provider as well.   ?To learn more about what you can do with MyChart, go to [email or web address].   ? ?Your next appointment:   ?6 week(s) ? ?The format for your next appointment:   ?In Person ? ?Provider:   ?Christen Bame, NP       ? ? ?

## 2021-05-06 NOTE — Progress Notes (Signed)
Cardiology Office Note:    Date:  05/06/2021   ID:  Lauriel, Helin October 12, 1945, MRN [id number]  PCP:  Selinda Orion   [facility] Providers Cardiologist:  Lauree Chandler, MD     Referring MD: Aura Dials, PA-C   Chief Complaint: reestablish cardiac care  History of Present Illness:    ATHALIE NEWHARD is a very pleasant 76 y.o. female with a hx of HTN, OSA, acute respiratory failure with hypoxia and hypercapnia due to aspiration pneumonia  Seen remotely by Dr. Radford Pax with normal stress test in 2009. She reestablished care with Dr. Angelena Form in 2018 for evaluation of chest pain. Echo 10/2016 revealed normal LVEF, LVH with small LVOT gradient, no SAM. Cardiac cath 10/2016 revealed no evidence of CAD and normal filling pressures.   She was last seen in our office 02/10/19 by Dr. Angelena Form at which time no changes were made and she was advised to follow-up in 12 months.   She was hospitalized 4/24-4/26/22 following an episode of being found unconscious with vomit on her chest. She reported she had drank 3 beers and a shot of liquor and was found the following morning by her son. He measured her O2 saturation which was 52% by pulse ox. Upon admission, she required intubation due to persistent respiratory acidosis despite BiPAP. She was extubated on 06/18/20 and discharged on home O2 at 2 L. She reported that she was grieving the death of her husband just prior to admission.   Today, she is here alone and would like to reestablish cardiac care. She admits that she has not been taking good care of herself for the past year, since the death of her husband in 2020-06-29.  She cared for him for 2 years prior to his death and has "put herself in a shell."  States she is starting to feel back to normal and would like to check on her heart health. She previously walked almost daily but has not been very active since her husband's death. She started walking for exercise last week and  tolerated the activity without chest pain. She is having some dyspnea on exertion and bilateral lower extremity swelling. She has orthopnea, but no PND. Is having a sleep test at the end of this month.  Reports at times she is fearful to go to sleep.  Not on oxygen since hospitalization 06/29/20.  Recent episode of working in her flower garden and had to lay down on the ground and vomit due to feeling of weakness and overwhelming nausea.  Reports the next time she worked outside she did so on an empty stomach and felt better. Hydrates well. She denies chest pain, melena, hematuria, diaphoresis, weakness, presyncope, or syncope, Occasional palpitations that do not interfere with her daily activity.She is concerned about "broken heart syndrome."   Past Medical History:  Diagnosis Date   Allergic rhinitis, cause unspecified    Arthritis    Cancer ([place])    hx skin cancer   Depression    Esophageal stenosis    GERD (gastroesophageal reflux disease)    Hiatal hernia    History of bronchitis as a child    History of nonmelanoma skin cancer    History of urinary tract infection    Hyperlipidemia    Leg fracture, right    2015   Multiple falls    Pneumonia    2015   Unspecified essential hypertension    PT STOPPED BP MED WITH PCP KNOWLEDGE  Wears glasses     Past Surgical History:  Procedure Laterality Date   APPENDECTOMY  1984   BLADDER SUSPENSION  SEVERAL YRS AGO   HAND SURGERY     trigger finger release bilat    HARDWARE REMOVAL Right 04/19/2014   Procedure: HARDWARE REMOVAL FROM RIGHT TIBIA;  Surgeon: [name], MD;  Location: [facility];  Service: Orthopedics;  Laterality: Right;   LAPAROSCOPIC CHOLECYSTECTOMY  04/2004   laporoscopy  1977   ovarian cyst   LATERAL COLLATERAL LIGAMENT REPAIR, KNEE     RT KNEE   LEFT HEART CATH AND CORONARY ANGIOGRAPHY N/A 11/12/2016   Procedure: LEFT HEART CATH AND CORONARY ANGIOGRAPHY;  Surgeon: Burnell Blanks, MD;  Location: [place]  CV LAB;  Service: Cardiovascular;  Laterality: N/A;   LUMBAR LAMINECTOMY/DECOMPRESSION MICRODISCECTOMY N/A 03/07/2015   Procedure: COMPLETE DECOMPRESSION/LUMBAR LAMINECTOMY L4 - L5 FOR STENOSIS 1 LEVEL;  Surgeon: Latanya Maudlin, MD;  Location: [facility];  Service: Orthopedics;  Laterality: N/A;   ORIF TIBIA PLATEAU Right 12/26/2013   Procedure: OPEN REDUCTION INTERNAL FIXATION (ORIF) TIBIAL PLATEAU;  Surgeon: [name], MD;  [facility];  Service: Orthopedics;  Laterality: Right;   TOTAL ANKLE REPLACEMENT     7 years ago; left    TOTAL KNEE ARTHROPLASTY Right 08/09/2014   Procedure: RIGHT TOTAL KNEE ARTHROPLASTY;  Surgeon: Gaynelle Arabian, MD;  [facility];  Service: Orthopedics;  Laterality: Right;   TUBAL LIGATION     76 reversal in 81 and tubal in 85    Current Medications: Current Meds  Medication Sig   acetaminophen (TYLENOL) 325 MG tablet Take 2 tablets (650 mg total) by mouth every 6 (six) hours as needed for mild pain, moderate pain or fever.   amLODipine (NORVASC) 10 MG tablet Take 1 tablet (10 mg total) by mouth daily.   aspirin EC 81 MG EC tablet Take 1 tablet (81 mg total) by mouth daily with breakfast. Swallow whole.   calcium citrate-vitamin D (CITRACAL+D) 315-200 MG-UNIT per tablet Take 1 tablet by mouth every morning.   DULoxetine (CYMBALTA) 60 MG capsule Take 1 capsule (60 mg total) by mouth daily.   metoprolol succinate (TOPROL-XL) 25 MG 24 hr tablet Take 1 tablet (25 mg total) by mouth daily.   Multiple Vitamin (MULTIVITAMIN WITH MINERALS) TABS tablet Take 1 tablet by mouth daily.   VYTORIN 10-40 MG tablet Take 1 tablet by mouth at bedtime.     Allergies:   Patient has no known allergies.   Social History   Socioeconomic History   Marital status: Widowed    Spouse name: Not on file   Number of children: 1   Years of education: Not on file   Highest education level: Not on file  Occupational History   Occupation: Retired    Comment: Event organiser   Tobacco Use   Smoking status: Never   Smokeless tobacco: Never  Vaping Use   Vaping Use: Never used  Substance and Sexual Activity   Alcohol use: Yes    Alcohol/week: 3.0 standard drinks    Types: 3 Cans of beer per week    Comment: OCCASIONAL   Drug use: No   Sexual activity: Yes    Birth control/protection: None    Comment: not asked  Other Topics Concern   Not on file  Social History Narrative   Not on file   Social Determinants of Health   Financial Resource Strain: Not on file  Food Insecurity: Not on file  Transportation Needs: Not on file  Physical Activity: Not on file  Stress: Not on file  Social Connections: Not on file     Family History: The patient's family history includes Diabetes in her sister; Heart disease in her brother; Stomach cancer (age of onset: 32) in her brother. There is no history of Colon cancer.  ROS:   Please see the history of present illness.    + dyspnea  + leg edema All other systems reviewed and are negative.  Labs/Other Studies Reviewed:    The following studies were reviewed today:  VAS [place] LE (DVT) 9/2021Summary: RIGHT: - No evidence of common femoral vein obstruction. LEFT: - There is no evidence of deep vein thrombosis in the lower extremity. - There is no evidence of superficial venous thrombosis. - No cystic structure found in the popliteal fossa   LHC 10/2016  1. No angiographic evidence of CAD 2. Normal LV filling pressures   Recommendations: No further ischemic workup. Continue optimal BP control.   Echo 10/2016  Left ventricle:  There appears to be a small LVOT gradient although  SAM is not appreciated due to poor image quality. The cavity size  was normal. Wall thickness was increased in a pattern of moderate  LVH. Systolic function was normal. The estimated ejection fraction  was in the range of 60% to 65%. There was dynamic obstruction at  restin the outflow tract, with mid-cavity obliteration, a peak   velocity of 288 cm/sec, and a peak gradient of 33 mm Hg. Wall  motion was normal; there were no regional wall motion  abnormalities. Doppler parameters are consistent with abnormal left  ventricular relaxation (grade 1 diastolic dysfunction).  Aortic valve:   Trileaflet; normal thickness leaflets. Mobility was  not restricted.  Doppler:  Transvalvular velocity was within the  normal range. There was no stenosis. There was no regurgitation.  Aorta:  The aorta was normal, not dilated, and non-diseased. Aortic  root: The aortic root was normal in size.  Mitral valve:   Mildly thickened leaflets . Mobility was not  restricted.  Doppler:  Transvalvular velocity was within the normal  range. There was no evidence for stenosis. There was trivial  regurgitation.    Peak gradient (D): 2 mm Hg.  Left atrium:  The atrium was normal in size.  Atrial septum:  No defect or patent foramen ovale was identified.  Right ventricle:  The cavity size was mildly dilated. Wall  thickness was normal. Systolic function was normal.  Pulmonic valve:    Doppler:  Transvalvular velocity was within the  normal range. There was no evidence for stenosis. There was mild  regurgitation.  Tricuspid valve:   Structurally normal valve.    Doppler:  Transvalvular velocity was within the normal range. There was mild  regurgitation.  Pulmonary artery:   The main pulmonary artery was normal-sized.  Systolic pressure was within the normal range.  Right atrium:  The atrium was normal in size.  Pericardium:  The pericardium was normal in appearance. There was  no pericardial effusion.  Systemic veins:  Inferior vena cava: The vessel was normal in size. The  respirophasic diameter changes were in the normal range (>= 50%),  consistent with normal central venous pressure.  Post procedure conclusions  Ascending Aorta:  - The aorta was normal, not dilated, and non-diseased.   Recent Labs: 06/17/2020: B Natriuretic Peptide  120.0 06/18/2020: ALT 66 06/19/2020: BUN 8; Creatinine, Ser 0.60; Hemoglobin 11.8; Platelets 192; Potassium 3.1; Sodium  144   Recent Lipid Panel    Component Value Date/Time   CHOL  05/15/2007 0350    171        ATP III CLASSIFICATION:  <200     mg/dL   Desirable  200-239  mg/dL   Borderline High  >=240    mg/dL   High   TRIG 137 06/18/2020 0538   HDL 43 05/15/2007 0350   CHOLHDL 4.0 05/15/2007 0350   VLDL 22 05/15/2007 0350   LDLCALC (H) 05/15/2007 0350    106        Total Cholesterol/HDL:CHD Risk Coronary Heart Disease Risk Table                     Men   Women  1/2 Average Risk   3.4   3.3     Risk Assessment/Calculations:       Physical Exam:    VS:  BP 126/76    Pulse 70    Ht '5\' 4"'$  (1.626 m)    Wt 195 lb 3.2 oz (88.5 kg)    SpO2 96%    BMI 33.51 kg/m     Wt Readings from Last 3 Encounters:  05/06/21 195 lb 3.2 oz (88.5 kg)  06/19/20 189 lb 6 oz (85.9 kg)  12/30/19 180 lb (81.6 kg)     GEN:  Well nourished, well developed in no acute distress HEENT: Normal NECK: No JVD; No carotid bruits CARDIAC: RRR, no murmurs, rubs, gallops RESPIRATORY:  Clear to auscultation without rales, wheezing or rhonchi  ABDOMEN: Soft, non-tender, non-distended MUSCULOSKELETAL:  1+ pitting edema RLE, mild non-pitting edema LLE; No deformity. 2+ pedal pulses, equal bilaterally SKIN: Warm and dry NEUROLOGIC:  Alert and oriented x 3 PSYCHIATRIC:  Normal affect   EKG:  EKG is ordered today.  The ekg ordered today demonstrates normal sinus rhythm at 70 bpm, Right BBB  Diagnoses:    1. Hyperlipidemia LDL goal <100   2. Essential hypertension   3. Dyspnea, unspecified type   4. LVH (left ventricular hypertrophy)   5. Bilateral leg edema   6. Grief    Assessment and Plan:     Dyspnea: Dyspnea on exertion and at rest, lower extremity edema, orthopnea. Has not bee monitoring weight consistently. Feels that she may have some abdominal bloating as her bra strap feels tight. She has  been grieving the loss of her husband for almost one year. She is careful with sodium intake. Will get echo to evaluate heart and valve function. She is aware of  Takotsubo cardiomyopathy and would like to rule this out.   Leg edema: Bilateral leg swelling, right worse than left.  She has an ankle replacement in the left lower extremity. She is on amlodipine which may be contributing to leg edema. She is careful to avoid high sodium foods. Will evaluate heart function with updated echo. Will consider reducing amlodipine if LVEF is normal.   LVH: Moderate LVH seen on echo 10/2016. LV cavity size was normal. No aortic stenosis. Echo additionally revealed small LVOT gradient, SAM was not appreciated but image quality was poor. She was previously very active but has been sedentary over last 11 months. BP is well controlled today. Will get echo to reassess LVH and LVOT gradient. Has sleep study pending which I encouraged her to complete.   Essential hypertension: BP is well controlled.  As noted above she has bilateral leg swelling. We will review echocardiogram and adjust antihypertensives as indicated by  echo results.   Hyperlipidemia LDL goal < 100: LDL 82 01/2021.  Continue Vytorin.  Grief: Offered referral to counseling.  She states that she feels that she has overcome the worst part.  Has good family/friend support and she is returning to her regular activities. Denies suicidal ideations. She has had no alcohol since hospitalization April 2022.  We discussed CDC recommendations for alcohol consumption for females.     Disposition: 4-6 weeks with APP  Medication Adjustments/Labs and Tests Ordered: Current medicines are reviewed at length with the patient today.  Concerns regarding medicines are outlined above.  Orders Placed This Encounter  Procedures   EKG 12-Lead   ECHOCARDIOGRAM COMPLETE   No orders of the defined types were placed in this encounter.   Patient Instructions  Medication  Instructions:   Your physician recommends that you continue on your current medications as directed. Please refer to the Current Medication list given to you today.   *If you need a refill on your cardiac medications before your next appointment, please call your pharmacy*   Testing/Procedures:  Your physician has requested that you have an echocardiogram. Echocardiography is a painless test that uses sound waves to create images of your heart. It provides your doctor with information about the size and shape of your heart and how well your hearts chambers and valves are working. This procedure takes approximately one hour. There are no restrictions for this procedure.    Follow-Up: At [facility], you and your health needs are our priority.  As part of our continuing mission to provide you with exceptional heart care, we have created designated Provider Care Teams.  These Care Teams include your primary Cardiologist (physician) and Advanced Practice Providers (APPs -  Physician Assistants and Nurse Practitioners) who all work together to provide you with the care you need, when you need it.  We recommend signing up for the patient portal called "MyChart".  Sign up information is provided on this After Visit Summary.  MyChart is used to connect with patients for Virtual Visits (Telemedicine).  Patients are able to view lab/test results, encounter notes, upcoming appointments, etc.  Non-urgent messages can be sent to your provider as well.   To learn more about what you can do with MyChart, go to [email or web address].    Your next appointment:   6 week(s)  The format for your next appointment:   In Person  Provider:   Christen Bame, NP           Signed, Emmaline Life, NP  05/06/2021 4:39 PM    [place]

## 2021-05-15 ENCOUNTER — Ambulatory Visit (HOSPITAL_COMMUNITY): Payer: Medicare Other | Attending: Internal Medicine

## 2021-05-15 ENCOUNTER — Other Ambulatory Visit: Payer: Self-pay

## 2021-05-15 DIAGNOSIS — I10 Essential (primary) hypertension: Secondary | ICD-10-CM | POA: Diagnosis not present

## 2021-05-15 DIAGNOSIS — R0600 Dyspnea, unspecified: Secondary | ICD-10-CM | POA: Diagnosis not present

## 2021-05-15 DIAGNOSIS — E785 Hyperlipidemia, unspecified: Secondary | ICD-10-CM

## 2021-05-15 LAB — ECHOCARDIOGRAM COMPLETE
AR max vel: 2.17 cm2
AV Area VTI: 2.08 cm2
AV Area mean vel: 1.81 cm2
AV Mean grad: 4 mmHg
AV Peak grad: 6.8 mmHg
Ao pk vel: 1.3 m/s
Area-P 1/2: 2.62 cm2
S' Lateral: 1.6 cm

## 2021-05-15 MED ORDER — PERFLUTREN LIPID MICROSPHERE
1.0000 mL | INTRAVENOUS | Status: AC | PRN
  Administered 2021-05-15: 2 mL via INTRAVENOUS

## 2021-06-23 NOTE — Progress Notes (Signed)
?Cardiology Office Note:   ? ?Date:  06/25/2021  ? ?ID:  [name], DOB 06/12/1945, MRN [id number] ? ?PCP:  Aura Dials, PA-C ?  ?[facility] Providers ?Cardiologist:  Lauree Chandler, MD    ? ?Referring MD: Aura Dials, PA-C  ? ?Chief Complaint: follow-up dyspnea ? ?History of Present Illness:   ? ?[name] is a very pleasant 76 y.o. female with a hx of HTN, OSA, acute respiratory failure with hypoxia and hypercapnia due to aspiration pneumonia. ? ?Seen remotely by Dr. Radford Pax with normal stress test in 2009. She reestablished care with Dr. Angelena Form in 2018 for evaluation of chest pain. Echo 10/2016 revealed normal LVEF, LVH with small LVOT gradient, no SAM. Cardiac cath 10/2016 revealed no evidence of CAD and normal filling pressures.  ? ?She was last seen in our office 02/10/19 by Dr. Angelena Form at which time no changes were made and she was advised to follow-up in 12 months.  ? ?She was hospitalized 4/24-4/26/22 following an episode of being found unconscious with vomit on her chest. She reported she had drank 3 beers and a shot of liquor and was found the following morning by her son. He measured her O2 saturation which was 52% by pulse ox. Upon admission, she required intubation due to persistent respiratory acidosis despite BiPAP. She was extubated on 06/18/20 and discharged on home O2 at 2 L. She reported that she was grieving the death of her husband just prior to admission.  ? ?Her last office visit was 05/21/2021, seen by me to reestablish care. Admitted she has not been taking good care of herself for the past year, since the death of her husband in 06/09/2020. Had just returned to walking for exercise the previous week, without chest pain. Having some dyspnea on exertion, bilateral lower extremity swelling, and orthopnea, no PND. Being evaluated for OSA by PCP. Not on oxygen since hospitalization 06-09-2020. Recent episode of working in her flower garden and had to lay down on the  ground and vomit due to feeling of weakness and overwhelming nausea. Did not have return of symptoms the next time she worked outside as she did so well hydrated and with an empty stomach. Concerned about "broken heart syndrome."  Her echocardiogram revealed hyperdynamic LV function, grade 1 diastolic dysfunction, no significant valve disease. ? ?Today, she is here alone for follow-up.  She reports she has been a little fatigued recently but has been caring for her 27 year old sister who recently had a mastectomy she has begun walking on occasion for exercise and is tolerating this without shortness of breath or chest pain.  Vomits on occasion when she is working outside for long.,  Feels that this may be related to previous intubation and hospitalization from 2020-06-09. She denies chest pain, shortness of breath, lower extremity edema, palpitations, melena, hematuria, hemoptysis, diaphoresis, weakness, presyncope, syncope, orthopnea, and PND.  She continues to cope with the loss of her husband and feels that she is doing well and does not desire grief counseling.  She likes to read and prefers to read books on grief rather than meet one-on-one or in groups.  Tries to avoid high sodium foods and eat mostly fruits and vegetables, however she does not enjoy cooking for just herself.  ? ?Past Medical History:  ?Diagnosis Date  ? Allergic rhinitis, cause unspecified   ? Arthritis   ? Cancer [facility])   ? hx skin cancer  ? Depression   ? Esophageal stenosis   ?  GERD (gastroesophageal reflux disease)   ? Hiatal hernia   ? History of bronchitis as a child   ? History of nonmelanoma skin cancer   ? History of urinary tract infection   ? Hyperlipidemia   ? Leg fracture, right   ? 2015  ? Multiple falls   ? Pneumonia   ? 2015  ? Unspecified essential hypertension   ? PT STOPPED BP MED WITH PCP KNOWLEDGE  ? Wears glasses   ? ? ?Past Surgical History:  ?Procedure Laterality Date  ? APPENDECTOMY  1984  ? BLADDER SUSPENSION  SEVERAL  YRS AGO  ? HAND SURGERY    ? trigger finger release bilat   ? HARDWARE REMOVAL Right 04/19/2014  ? Procedure: HARDWARE REMOVAL FROM RIGHT TIBIA;  Surgeon: [name], MD;  [facility];  Service: Orthopedics;  Laterality: Right;  ? LAPAROSCOPIC CHOLECYSTECTOMY  04/2004  ? laporoscopy  1977  ? ovarian cyst  ? LATERAL COLLATERAL LIGAMENT REPAIR, KNEE    ? RT KNEE  ? LEFT HEART CATH AND CORONARY ANGIOGRAPHY N/A 11/12/2016  ? Procedure: LEFT HEART CATH AND CORONARY ANGIOGRAPHY;  Surgeon: Burnell Blanks, MD;  Location: [place] CV LAB;  Service: Cardiovascular;  Laterality: N/A;  ? LUMBAR LAMINECTOMY/DECOMPRESSION MICRODISCECTOMY N/A 03/07/2015  ? Procedure: COMPLETE DECOMPRESSION/LUMBAR LAMINECTOMY L4 - L5 FOR STENOSIS 1 LEVEL;  Surgeon: Latanya Maudlin, MD;  [facility];  Service: Orthopedics;  Laterality: N/A;  ? ORIF TIBIA PLATEAU Right 12/26/2013  ? Procedure: OPEN REDUCTION INTERNAL FIXATION (ORIF) TIBIAL PLATEAU;  Surgeon: [name], MD;  [facility];  Service: Orthopedics;  Laterality: Right;  ? TOTAL ANKLE REPLACEMENT    ? 7 years ago; left   ? TOTAL KNEE ARTHROPLASTY Right 08/09/2014  ? Procedure: RIGHT TOTAL KNEE ARTHROPLASTY;  Surgeon: Gaynelle Arabian, MD;  [facility];  Service: Orthopedics;  Laterality: Right;  ? TUBAL LIGATION    ? 76 reversal in 81 and tubal in 55  ? ? ?Current Medications: ?Current Meds  ?Medication Sig  ? acetaminophen (TYLENOL) 325 MG tablet Take 2 tablets (650 mg total) by mouth every 6 (six) hours as needed for mild pain, moderate pain or fever.  ? amLODipine (NORVASC) 10 MG tablet Take 1 tablet (10 mg total) by mouth daily.  ? aspirin EC 81 MG EC tablet Take 1 tablet (81 mg total) by mouth daily with breakfast. Swallow whole.  ? calcium citrate-vitamin D (CITRACAL+D) 315-200 MG-UNIT per tablet Take 1 tablet by mouth every morning.  ? DULoxetine (CYMBALTA) 60 MG capsule Take 1 capsule (60 mg total) by mouth daily.  ? famotidine (PEPCID) 20 MG tablet  Take 20 mg by mouth once as needed for heartburn.  ? metoprolol succinate (TOPROL-XL) 25 MG 24 hr tablet Take 1 tablet (25 mg total) by mouth daily.  ? Multiple Vitamin (MULTIVITAMIN WITH MINERALS) TABS tablet Take 1 tablet by mouth daily.  ? VYTORIN 10-40 MG tablet Take 1 tablet by mouth at bedtime.  ?  ? ?Allergies:   Patient has no known allergies.  ? ?Social History  ? ?Socioeconomic History  ? Marital status: Widowed  ?  Spouse name: Not on file  ? Number of children: 1  ? Years of education: Not on file  ? Highest education level: Not on file  ?Occupational History  ? 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 Smoking status: Never  ? Smokeless tobacco: Never  ?Vaping Use  ? Vaping Use:  Never used  ?Substance and Sexual Activity  ? Alcohol use: Yes  ?  Alcohol/week: 3.0 standard drinks  ?  Types: 3 Cans of beer per week  ?  Comment: OCCASIONAL  ? Drug use: No  ? Sexual activity: Yes  ?  Birth control/protection: None  ?  Comment: not asked  ?Other Topics Concern  ? Not on file  ?Social History Narrative  ? Not on file  ? ?Social Determinants of Health  ? ?Financial Resource Strain: Not on file  ?Food Insecurity: Not on file  ?Transportation Needs: Not on file  ?Physical Activity: Not on file  ?Stress: Not on file  ?Social Connections: Not on file  ?  ? ?Family History: ?The patient's family history includes Diabetes in her sister; Heart disease in her brother; Stomach cancer (age of onset: 72) in her brother. There is no history of Colon cancer. ? ?ROS:   ?Please see the history of present illness.    ?+ dyspnea  ?+ leg edema ?All other systems reviewed and are negative. ? ?Labs/Other Studies Reviewed:   ? ?The following studies were reviewed today: ? ?Echo 05/15/21 ? ?1. Left ventricular ejection fraction, by estimation, is >75%. The left  ?ventricle has hyperdynamic function. The left ventricle has no regional  ?wall motion abnormalities. There is moderate left ventricular hypertrophy.   ?Left ventricular diastolic  ?parameters are consistent with Grade I diastolic dysfunction (impaired  ?relaxation).  ? 2. Right ventricular systolic function is normal. The right ventricular  ?size is n

## 2021-06-25 ENCOUNTER — Encounter: Payer: Self-pay | Admitting: Nurse Practitioner

## 2021-06-25 ENCOUNTER — Ambulatory Visit (INDEPENDENT_AMBULATORY_CARE_PROVIDER_SITE_OTHER): Payer: Medicare Other | Admitting: Nurse Practitioner

## 2021-06-25 VITALS — BP 138/62 | HR 84 | Ht 64.0 in | Wt 191.0 lb

## 2021-06-25 DIAGNOSIS — I517 Cardiomegaly: Secondary | ICD-10-CM

## 2021-06-25 DIAGNOSIS — F4321 Adjustment disorder with depressed mood: Secondary | ICD-10-CM | POA: Diagnosis not present

## 2021-06-25 DIAGNOSIS — I10 Essential (primary) hypertension: Secondary | ICD-10-CM | POA: Diagnosis not present

## 2021-06-25 DIAGNOSIS — R600 Localized edema: Secondary | ICD-10-CM | POA: Diagnosis not present

## 2021-06-25 DIAGNOSIS — E785 Hyperlipidemia, unspecified: Secondary | ICD-10-CM

## 2021-06-25 NOTE — Patient Instructions (Signed)
Medication Instructions:  ? ?Your physician recommends that you continue on your current medications as directed. Please refer to the Current Medication list given to you today. ? ?*If you need a refill on your cardiac medications before your next appointment, please call your pharmacy* ? ?Lab Work: ? ?None ordered!! ? ?If you have labs (blood work) drawn today and your tests are completely normal, you will receive your results only by: ?MyChart Message (if you have MyChart) OR ?A paper copy in the mail ?If you have any lab test that is abnormal or we need to change your treatment, we will call you to review the results. ? ?Testing/Procedures: ? ?None ordered!!!! ? ?Follow-Up: ?At [facility], you and your health needs are our priority.  As part of our continuing mission to provide you with exceptional heart care, we have created designated Provider Care Teams.  These Care Teams include your primary Cardiologist (physician) and Advanced Practice Providers (APPs -  Physician Assistants and Nurse Practitioners) who all work together to provide you with the care you need, when you need it. ? ?We recommend signing up for the patient portal called "MyChart".  Sign up information is provided on this After Visit Summary.  MyChart is used to connect with patients for Virtual Visits (Telemedicine).  Patients are able to view lab/test results, encounter notes, upcoming appointments, etc.  Non-urgent messages can be sent to your provider as well.   ?To learn more about what you can do with MyChart, go to [email or web address].   ? ?Your next appointment:   ?6 month(s) ? ?The format for your next appointment:   ?In Person ? ?Provider:   ?Lauree Chandler, MD   ? ? ?Important Information About Sugar ? ? ? ? ?  ?

## 2021-06-26 ENCOUNTER — Encounter: Payer: Self-pay | Admitting: Nurse Practitioner

## 2021-08-24 ENCOUNTER — Other Ambulatory Visit: Payer: Self-pay

## 2021-08-24 ENCOUNTER — Encounter (HOSPITAL_BASED_OUTPATIENT_CLINIC_OR_DEPARTMENT_OTHER): Payer: Self-pay

## 2021-08-24 ENCOUNTER — Emergency Department (HOSPITAL_BASED_OUTPATIENT_CLINIC_OR_DEPARTMENT_OTHER)
Admission: EM | Admit: 2021-08-24 | Discharge: 2021-08-24 | Disposition: A | Discharge status: Home / Self Care | Payer: Medicare Other | Attending: Emergency Medicine | Admitting: Emergency Medicine

## 2021-08-24 DIAGNOSIS — I10 Essential (primary) hypertension: Secondary | ICD-10-CM | POA: Insufficient documentation

## 2021-08-24 DIAGNOSIS — Z7982 Long term (current) use of aspirin: Secondary | ICD-10-CM | POA: Insufficient documentation

## 2021-08-24 DIAGNOSIS — Z79899 Other long term (current) drug therapy: Secondary | ICD-10-CM | POA: Diagnosis not present

## 2021-08-24 DIAGNOSIS — J029 Acute pharyngitis, unspecified: Secondary | ICD-10-CM | POA: Insufficient documentation

## 2021-08-24 LAB — GROUP A STREP BY PCR: Group A Strep by PCR: NOT DETECTED

## 2021-08-24 MED ORDER — ACETAMINOPHEN 325 MG PO TABS
650.0000 mg | ORAL_TABLET | Freq: Once | ORAL | Status: AC
  Administered 2021-08-24: 650 mg via ORAL
  Filled 2021-08-24: qty 2

## 2021-08-24 NOTE — Discharge Instructions (Signed)
You were seen today for sore throat. Your workup was negative for strep at this time. I recommend using Tylenol for pain relief at home. You can continue to use popsicles and other soothing food items as well. I recommend follow up with your PCP if you fail to see improvement.

## 2021-08-24 NOTE — ED Triage Notes (Signed)
Patient here POV from Home.  Endorse Sore Throat that began yesterday. Since then Symptoms have worsened.  No Other URI Symptoms. Possible Subjective Fever last PM.  NAD Noted during Triage. A&Ox4. GCS 15. Ambulatory. Patient speaking in Clear and Complete Sentences.

## 2021-08-24 NOTE — ED Provider Notes (Signed)
[place] EMERGENCY DEPT Provider Note   CSN: [id number] Arrival date & time: 08/24/21  1207     History  Chief Complaint  Patient presents with   Sore Throat    [name] is a 76 y.o. female.  Patient presents to the hospital complaining of 1 day of sore throat.  Patient states that she began to feel that her throat was scratchy last night.  She states that it worsened overnight and into today.  She has been eating "Icees" which are soothing her throat.  Patient has not taken medications at home.  Patient denies fevers and is afebrile upon arrival.  Denies body aches, chills, shortness of breath, cough, rhinorrhea, headache.  Past medical history significant for history of GERD, pneumonia, hiatal hernia, hyperlipidemia, esophageal stenosis, hypertension, depression  HPI     Home Medications Prior to Admission medications   Medication Sig Start Date End Date Taking? Authorizing Provider  acetaminophen (TYLENOL) 325 MG tablet Take 2 tablets (650 mg total) by mouth every 6 (six) hours as needed for mild pain, moderate pain or fever. 06/19/20   Roxan Hockey, MD  amLODipine (NORVASC) 10 MG tablet Take 1 tablet (10 mg total) by mouth daily. 06/19/20   Roxan Hockey, MD  aspirin EC 81 MG EC tablet Take 1 tablet (81 mg total) by mouth daily with breakfast. Swallow whole. 06/20/20   Roxan Hockey, MD  calcium citrate-vitamin D (CITRACAL+D) 315-200 MG-UNIT per tablet Take 1 tablet by mouth every morning.    [provider]  DULoxetine (CYMBALTA) 60 MG capsule Take 1 capsule (60 mg total) by mouth daily. 06/19/20   Roxan Hockey, MD  famotidine (PEPCID) 20 MG tablet Take 20 mg by mouth once as needed for heartburn. 06/09/21   [provider]  metoprolol succinate (TOPROL-XL) 25 MG 24 hr tablet Take 1 tablet (25 mg total) by mouth daily. 06/19/20   Roxan Hockey, MD  Multiple Vitamin (MULTIVITAMIN WITH MINERALS) TABS tablet Take 1 tablet by mouth  daily.    [provider]  VYTORIN 10-40 MG tablet Take 1 tablet by mouth at bedtime. 06/19/20   Roxan Hockey, MD      Allergies    Patient has no known allergies.    Review of Systems   Review of Systems  Constitutional:  Negative for chills and fever.  HENT:  Positive for sore throat. Negative for congestion and sneezing.   Respiratory:  Negative for cough and shortness of breath.   Cardiovascular:  Negative for chest pain.  Gastrointestinal:  Negative for abdominal pain and nausea.  Musculoskeletal:  Negative for myalgias.    Physical Exam Updated Vital Signs BP 139/77 (BP Location: Right Arm)   Pulse 95   Temp 98.3 F (36.8 C)   Resp 14   Ht '5\' 4"'$  (1.626 m)   Wt 86.6 kg   SpO2 93%   BMI 32.77 kg/m  Physical Exam Vitals and nursing note reviewed.  Constitutional:      General: She is not in acute distress. HENT:     Head: Normocephalic and atraumatic.     Mouth/Throat:     Mouth: Mucous membranes are moist.     Pharynx: Posterior oropharyngeal erythema present. No pharyngeal swelling or oropharyngeal exudate.     Tonsils: No tonsillar exudate.  Eyes:     Conjunctiva/sclera: Conjunctivae normal.     Pupils: Pupils are equal, round, and reactive to light.  Cardiovascular:     Rate and Rhythm: Normal rate and  regular rhythm.     Heart sounds: Normal heart sounds.  Pulmonary:     Effort: Pulmonary effort is normal.     Breath sounds: Normal breath sounds.  Abdominal:     Palpations: Abdomen is soft.     Tenderness: There is no abdominal tenderness.  Musculoskeletal:     Cervical back: Normal range of motion and neck supple.  Lymphadenopathy:     Cervical: No cervical adenopathy.  Skin:    General: Skin is warm and dry.  Neurological:     Mental Status: She is alert and oriented to person, place, and time.     ED Results / Procedures / Treatments   Labs (all labs ordered are listed, but only abnormal results are displayed) Labs Reviewed   GROUP A STREP BY PCR    EKG None  Radiology No results found.  Procedures Procedures    Medications Ordered in ED Medications  acetaminophen (TYLENOL) tablet 650 mg (has no administration in time range)    ED Course/ Medical Decision Making/ A&P                           Medical Decision Making Risk OTC drugs.   Patient presents with a chief complaint of sore throat.  Patient states that she has history of previous sore throats which have been evaluated by both primary care and ENT.  Patient states in the past they were unable to determine exactly what caused her sore throats but that she was treated with antibiotics with some success. I reviewed her chart history and see history of esophageal strictures but see no visits for her throat over the past 5 years.   I ordered a rapid group A strep test which was negative.  Patient has an erythematous oropharynx but uvula is midline, no significant swelling, no cobblestoning.  No signs of a peritonsillar abscess.  Voice is normal. Very low clinical suspicion of retropharyngeal abscess. Discussed possible CT of neck with patient but patient declined at this time which is very reasonable.   Patient likely has a viral infection. Patient is stable and agreeable with discharge home at this time. Plan for her to use over the counter medication for symptom control. Follow up with PCP. Patient will return for re-evaluation if she becomes unable to swallow.         Final Clinical Impression(s) / ED Diagnoses Final diagnoses:  Sore throat    Rx / DC Orders ED Discharge Orders     None         Ronny Bacon 08/24/21 1324    Malvin Johns, MD 08/24/21 1507

## 2021-08-24 NOTE — ED Notes (Signed)
Discharge instructions, follow up care, and pain control reviewed and explained, pt verbalized understanding and had no further questions on d/c.

## 2021-12-29 NOTE — Progress Notes (Unsigned)
No chief complaint on file.  History of Present Illness: 76 yo female with history of GERD, HTN and HLD who is here today for cardiac follow up. She has been seen remotely by Dr. Fransico Him and reports a normal stress test at that time in 2009. I met her in September 2018 during an office visit for evaluation of chest pain. She has never smoked. Echo September 2018 with normal LV systolic function, FMBB=40-37%, LVH with small LVOT gradient, no SAM. Cardiac cath 11/12/16 with no evidence of CAD and normal filling pressures. She was admitted in April 2022 after an episode of alcohol intoxication and subsequent unconsciousness with acute respiratory failure requiring intubation. Echo March 2023 with LVEF=75% with Trivial MR.   She is here today for follow up. The patient denies any chest pain, dyspnea, palpitations, lower extremity edema, orthopnea, PND, dizziness, near syncope or syncope.   Primary Care Provider: Selinda Orion   Past Medical History:  Diagnosis Date   Allergic rhinitis, cause unspecified    Arthritis    Cancer ([place])    hx skin cancer   Depression    Esophageal stenosis    GERD (gastroesophageal reflux disease)    Hiatal hernia    History of bronchitis as a child    History of nonmelanoma skin cancer    History of urinary tract infection    Hyperlipidemia    Leg fracture, right    2015   Multiple falls    Pneumonia    2015   Unspecified essential hypertension    PT STOPPED BP MED WITH PCP KNOWLEDGE   Wears glasses     Past Surgical History:  Procedure Laterality Date   APPENDECTOMY  1984   BLADDER SUSPENSION  SEVERAL YRS AGO   HAND SURGERY     trigger finger release bilat    HARDWARE REMOVAL Right 04/19/2014   Procedure: HARDWARE REMOVAL FROM RIGHT TIBIA;  Surgeon: [name], MD;  Location: [facility];  Service: Orthopedics;  Laterality: Right;   LAPAROSCOPIC CHOLECYSTECTOMY  04/2004   laporoscopy  1977   ovarian cyst   LATERAL COLLATERAL  LIGAMENT REPAIR, KNEE     RT KNEE   LEFT HEART CATH AND CORONARY ANGIOGRAPHY N/A 11/12/2016   Procedure: LEFT HEART CATH AND CORONARY ANGIOGRAPHY;  Surgeon: Burnell Blanks, MD;  Location: [facility];  Service: Cardiovascular;  Laterality: N/A;   LUMBAR LAMINECTOMY/DECOMPRESSION MICRODISCECTOMY N/A 03/07/2015   Procedure: COMPLETE DECOMPRESSION/LUMBAR LAMINECTOMY L4 - L5 FOR STENOSIS 1 LEVEL;  Surgeon: Latanya Maudlin, MD;  Location: [facility];  Service: Orthopedics;  Laterality: N/A;   ORIF TIBIA PLATEAU Right 12/26/2013   Procedure: OPEN REDUCTION INTERNAL FIXATION (ORIF) TIBIAL PLATEAU;  Surgeon: [name], MD;  Location: [facility];  Service: Orthopedics;  Laterality: Right;   TOTAL ANKLE REPLACEMENT     7 years ago; left    TOTAL KNEE ARTHROPLASTY Right 08/09/2014   Procedure: RIGHT TOTAL KNEE ARTHROPLASTY;  Surgeon: Gaynelle Arabian, MD;  Location: [facility];  Service: Orthopedics;  Laterality: Right;   TUBAL LIGATION     76 reversal in 82 and tubal in 85    Current Outpatient Medications  Medication Sig Dispense Refill   acetaminophen (TYLENOL) 325 MG tablet Take 2 tablets (650 mg total) by mouth every 6 (six) hours as needed for mild pain, moderate pain or fever. 12 tablet 0   amLODipine (NORVASC) 10 MG tablet Take 1 tablet (10 mg total) by mouth daily. [place]  tablet 3   aspirin EC 81 MG EC tablet Take 1 tablet (81 mg total) by mouth daily with breakfast. Swallow whole. 30 tablet 11   calcium citrate-vitamin D (CITRACAL+D) 315-200 MG-UNIT per tablet Take 1 tablet by mouth every morning.     DULoxetine (CYMBALTA) 60 MG capsule Take 1 capsule (60 mg total) by mouth daily. 30 capsule 2   famotidine (PEPCID) 20 MG tablet Take 20 mg by mouth once as needed for heartburn.     metoprolol succinate (TOPROL-XL) 25 MG 24 hr tablet Take 1 tablet (25 mg total) by mouth daily. 30 tablet 5   Multiple Vitamin (MULTIVITAMIN WITH MINERALS) TABS tablet Take 1 tablet by mouth daily.     VYTORIN 10-40  MG tablet Take 1 tablet by mouth at bedtime. 30 tablet 5   No current facility-administered medications for this visit.    No Known Allergies  Social History   Socioeconomic History   Marital status: Widowed    Spouse name: Not on file   Number of children: 1   Years of education: Not on file   Highest education level: Not on file  Occupational History   Occupation: Retired    Comment: Event organiser  Tobacco Use   Smoking status: Never   Smokeless tobacco: Never  Vaping Use   Vaping Use: Never used  Substance and Sexual Activity   Alcohol use: Yes    Alcohol/week: 3.0 standard drinks of alcohol    Types: 3 Cans of beer per week    Comment: OCCASIONAL   Drug use: No   Sexual activity: Yes    Birth control/protection: None    Comment: not asked  Other Topics Concern   Not on file  Social History Narrative   Not on file   Social Determinants of Health   Financial Resource Strain: Not on file  Food Insecurity: Not on file  Transportation Needs: Not on file  Physical Activity: Not on file  Stress: Not on file  Social Connections: Not on file  Intimate Partner Violence: Not on file    Family History  Problem Relation Age of Onset   Stomach cancer Brother 51       question if started pancreatic    Diabetes Sister    Heart disease Brother    Colon cancer Neg Hx     Review of Systems:  As stated in the HPI and otherwise negative.   There were no vitals taken for this visit.  Physical Examination: General: Well developed, well nourished, NAD  HEENT: OP clear, mucus membranes moist  SKIN: warm, dry. No rashes. Neuro: No focal deficits  Musculoskeletal: Muscle strength 5/5 all ext  Psychiatric: Mood and affect normal  Neck: No JVD, no carotid bruits, no thyromegaly, no lymphadenopathy.  Lungs:Clear bilaterally, no wheezes, rhonci, crackles Cardiovascular: Regular rate and rhythm. No murmurs, gallops or rubs. Abdomen:Soft. Bowel sounds present. Non-tender.   Extremities: No lower extremity edema. Pulses are 2 + in the bilateral DP/PT.  Echo March 2023:  1. Left ventricular ejection fraction, by estimation, is >75%. The left  ventricle has hyperdynamic function. The left ventricle has no regional  wall motion abnormalities. There is moderate left ventricular hypertrophy.  Left ventricular diastolic  parameters are consistent with Grade I diastolic dysfunction (impaired  relaxation).   2. Right ventricular systolic function is normal. The right ventricular  size is normal. Tricuspid regurgitation signal is inadequate for assessing  PA pressure.   3. The mitral valve is abnormal.  Trivial mitral valve regurgitation.   4. The aortic valve is tricuspid. Aortic valve regurgitation is not  visualized.   5. Increased flow velocities may be secondary to anemia, thyrotoxicosis,  hyperdynamic or high flow state.   EKG:  EKG is *** ordered today and shows   Recent Labs: No results found for requested labs within last 365 days.   Lipid Panel    Wt Readings from Last 3 Encounters:  08/24/21 190 lb 14.7 oz (86.6 kg)  06/25/21 191 lb (86.6 kg)  05/06/21 195 lb 3.2 oz (88.5 kg)    Assessment and Plan:   1. Chest pain: No evidence of CAD by cath 2018. No recent chest pain  2. LVH: No syncope or dizziness. She has a LVOT gradient but no SAM. Continue Toprol  3. HTN: BP is well controlled.   Labs/ tests ordered today include:  No orders of the defined types were placed in this encounter.   Disposition:   FU with me in 12 months    Signed, Lauree Chandler, MD 12/29/2021 3:03 PM    [facility] [address], [place], [place]  [address] Phone: [phone]; Fax: [phone]

## 2021-12-30 ENCOUNTER — Encounter: Payer: Self-pay | Admitting: Cardiovascular Disease

## 2021-12-30 ENCOUNTER — Ambulatory Visit: Payer: Medicare Other | Attending: Cardiovascular Disease | Admitting: Cardiovascular Disease

## 2021-12-30 VITALS — BP 118/70 | HR 86 | Ht 64.0 in | Wt 184.6 lb

## 2021-12-30 DIAGNOSIS — I10 Essential (primary) hypertension: Secondary | ICD-10-CM

## 2021-12-30 DIAGNOSIS — I517 Cardiomegaly: Secondary | ICD-10-CM

## 2021-12-30 NOTE — Patient Instructions (Signed)
Medication Instructions:  No changes *If you need a refill on your cardiac medications before your next appointment, please call your pharmacy*   Lab Work: none If you have labs (blood work) drawn today and your tests are completely normal, you will receive your results only by: [name] (if you have MyChart) OR A paper copy in the mail If you have any lab test that is abnormal or we need to change your treatment, we will call you to review the results.   Testing/Procedures: none   Follow-Up: At [facility], you and your health needs are our priority.  As part of our continuing mission to provide you with exceptional heart care, we have created designated Provider Care Teams.  These Care Teams include your primary Cardiologist (physician) and Advanced Practice Providers (APPs -  Physician Assistants and Nurse Practitioners) who all work together to provide you with the care you need, when you need it.   Your next appointment:   2 year(s)  The format for your next appointment:   In Person  Provider:   Lauree Chandler, MD      Important Information About Sugar

## 2023-01-21 ENCOUNTER — Telehealth: Payer: Self-pay | Admitting: Cardiovascular Disease

## 2023-01-21 NOTE — Telephone Encounter (Signed)
Patient states she feels better now than she did when she first called our office. She reports yesterday she had nausea and vomiting. She has had 2 episodes of feeling faint. Yesterday she almost passed out, felt her knees shaking and she did fall and hit her face on her recliner. Denies LOC. She reports having SOB and chest soreness in the front of her chest and around her back.   She reports she started mounjaro about 3 months ago and hasn't felt well since then.  Advised patient on ED precautions, she verbalized understanding. She requests next available appt at our office, offered her an appt with DOD Dr. Graciela Husbands on 12/4, she accepted.  Reminded patient to use ED if symptoms worsen or persist, patient verbalized understanding and expressed appreciation for call.

## 2023-01-21 NOTE — Telephone Encounter (Addendum)
STAT if patient feels like he/she is going to faint   1. Are you feeling dizzy, lightheaded, or faint right now? no    2. Have you passed out?  no (If yes move to .SYNCOPECHMG)   3. Do you have any other symptoms? Patient states she is having chest soreness. Patient states she had nausea and vomiting yesterday.  Brain fogginess. Patient states about three months ago she started [facility] and ever since then she started to feel funny.    4. Have you checked your HR and BP (record if available)? no

## 2023-01-28 ENCOUNTER — Ambulatory Visit: Payer: Medicare Other | Attending: Internal Medicine | Admitting: Internal Medicine

## 2023-01-28 ENCOUNTER — Encounter: Payer: Self-pay | Admitting: Internal Medicine

## 2023-01-28 VITALS — BP 110/68 | HR 73 | Ht 64.0 in | Wt 166.0 lb

## 2023-01-28 DIAGNOSIS — R072 Precordial pain: Secondary | ICD-10-CM | POA: Diagnosis not present

## 2023-01-28 DIAGNOSIS — R0600 Dyspnea, unspecified: Secondary | ICD-10-CM | POA: Diagnosis not present

## 2023-01-28 DIAGNOSIS — I517 Cardiomegaly: Secondary | ICD-10-CM

## 2023-01-28 NOTE — Progress Notes (Signed)
ELECTROPHYSIOLOGY OFFICE NOTE  Patient ID: [name], MRN: [id number], DOB/AGE: [birth year] 76 y.o. Admit date: (Not on file) Date of Consult: 01/28/2023  Primary Physician: Dani Gobble, PA-C Primary Cardiologist: CMac  HPI [name] is a 77 y.o. female seen as add on because of variety of symptoms that have developed since she started mounjaro.  This includes orthostatic intolerance dyspnea on exertion sometimes complicated by nausea vomiting and presyncope.  She is also had chest tightness with this and diaphoresis.  2 months ago she had none of this.  The Greggory Keen was started about 2 weeks before all the symptoms occurred.   DATE TEST EF   9/18 Echo  60-65%  LVH  9/18 LHC    % Non obstructive CAD  3/23 Echo  75%    Date Cr K Hgb  2/24   15.4  11/24 0.97 5.3              Past Medical History:  Diagnosis Date   Allergic rhinitis, cause unspecified    Arthritis    Cancer (HCC)    hx skin cancer   Depression    Esophageal stenosis    GERD (gastroesophageal reflux disease)    Hiatal hernia    History of bronchitis as a child    History of nonmelanoma skin cancer    History of urinary tract infection    Hyperlipidemia    Leg fracture, right    2015   Multiple falls    Pneumonia    2015   Unspecified essential hypertension    PT STOPPED BP MED WITH PCP KNOWLEDGE   Wears glasses       Surgical History:  Past Surgical History:  Procedure Laterality Date   APPENDECTOMY  1984   BLADDER SUSPENSION  SEVERAL YRS AGO   HAND SURGERY     trigger finger release bilat    HARDWARE REMOVAL Right 04/19/2014   Procedure: HARDWARE REMOVAL FROM RIGHT TIBIA;  Surgeon: Loanne Drilling, MD;  Location: [facility];  Service: Orthopedics;  Laterality: Right;   LAPAROSCOPIC CHOLECYSTECTOMY  04/2004   laporoscopy  1977   ovarian cyst   LATERAL COLLATERAL LIGAMENT REPAIR, KNEE     RT KNEE   LEFT HEART CATH AND CORONARY ANGIOGRAPHY N/A 11/12/2016   Procedure:  LEFT HEART CATH AND CORONARY ANGIOGRAPHY;  Surgeon: Kathleene Hazel, MD;  Location: [facility];  Service: Cardiovascular;  Laterality: N/A;   LUMBAR LAMINECTOMY/DECOMPRESSION MICRODISCECTOMY N/A 03/07/2015   Procedure: COMPLETE DECOMPRESSION/LUMBAR LAMINECTOMY L4 - L5 FOR STENOSIS 1 LEVEL;  Surgeon: Ranee Gosselin, MD;  Location: [facility];  Service: Orthopedics;  Laterality: N/A;   ORIF TIBIA PLATEAU Right 12/26/2013   Procedure: OPEN REDUCTION INTERNAL FIXATION (ORIF) TIBIAL PLATEAU;  Surgeon: Loanne Drilling, MD;  Location: [facility];  Service: Orthopedics;  Laterality: Right;   TOTAL ANKLE REPLACEMENT     7 years ago; left    TOTAL KNEE ARTHROPLASTY Right 08/09/2014   Procedure: RIGHT TOTAL KNEE ARTHROPLASTY;  Surgeon: Ollen Gross, MD;  Location: [facility];  Service: Orthopedics;  Laterality: Right;   TUBAL LIGATION     75 reversal in 81 and tubal in 85     Home Meds: Current Meds  Medication Sig   acetaminophen (TYLENOL) 325 MG tablet Take 2 tablets (650 mg total) by mouth every 6 (six) hours as needed for mild pain, moderate pain or fever.   amLODipine (NORVASC) 10 MG tablet Take 1  tablet (10 mg total) by mouth daily.   aspirin EC 81 MG EC tablet Take 1 tablet (81 mg total) by mouth daily with breakfast. Swallow whole.   calcium citrate-vitamin D (CITRACAL+D) 315-200 MG-UNIT per tablet Take 1 tablet by mouth every morning.   DULoxetine (CYMBALTA) 60 MG capsule Take 1 capsule (60 mg total) by mouth daily.   metoprolol succinate (TOPROL-XL) 25 MG 24 hr tablet Take 1 tablet (25 mg total) by mouth daily.   MOUNJARO 2.5 MG/0.5ML Pen Inject 2.5 mg into the skin once a week.   Multiple Vitamin (MULTIVITAMIN WITH MINERALS) TABS tablet Take 1 tablet by mouth daily.   VYTORIN 10-40 MG tablet Take 1 tablet by mouth at bedtime.    Allergies: No Known Allergies  Social History   Socioeconomic History   Marital status: Widowed    Spouse name: Not on file   Number of children: 1    Years of education: Not on file   Highest education level: Not on file  Occupational History   Occupation: Retired    Comment: Patent examiner  Tobacco Use   Smoking status: Never   Smokeless tobacco: Never  Vaping Use   Vaping status: Never Used  Substance and Sexual Activity   Alcohol use: Yes    Alcohol/week: 3.0 standard drinks of alcohol    Types: 3 Cans of beer per week    Comment: OCCASIONAL   Drug use: No   Sexual activity: Yes    Birth control/protection: None    Comment: not asked  Other Topics Concern   Not on file  Social History Narrative   Not on file   Social Determinants of Health   Financial Resource Strain: Low Risk  (07/14/2022)   Received from [facility]   Overall Financial Resource Strain (CARDIA)    Difficulty of Paying Living Expenses: Not hard at all  Food Insecurity: No Food Insecurity (07/14/2022)   Received from [facility]   Hunger Vital Sign    Worried About Running Out of Food in the Last Year: Never true    Ran Out of Food in the Last Year: Never true  Transportation Needs: No Transportation Needs (07/14/2022)   Received from [facility] - Transportation    Lack of Transportation (Medical): No    Lack of Transportation (Non-Medical): No  Physical Activity: Sufficiently Active (07/14/2022)   Received from [facility]   Exercise Vital Sign    Days of Exercise per Week: 5 days    Minutes of Exercise per Session: 50 min  Stress: No Stress Concern Present (07/14/2022)   Received from [facility] - Occupational Stress Questionnaire    Feeling of Stress : Only a little  Social Connections: Moderately Integrated (07/14/2022)   Received from [facility]   Social Network    How would you rate your social network (family, work, friends)?: Adequate participation with social networks  Intimate Partner Violence: Not At Risk (07/14/2022)   Received from [facility]   HITS    Over the  last 12 months how often did your partner physically hurt you?: Never    Over the last 12 months how often did your partner insult you or talk down to you?: Never    Over the last 12 months how often did your partner threaten you with physical harm?: Never    Over the last 12 months how often did your partner scream or curse at you?:  Never     Family History  Problem Relation Age of Onset   Stomach cancer Brother 47       question if started pancreatic    Diabetes Sister    Heart disease Brother    Colon cancer Neg Hx      ROS:  Please see the history of present illness.     All other systems reviewed and negative.    Physical Exam: Blood pressure 110/68, pulse 73, height 5\' 4"  (1.626 m), weight 166 lb (75.3 kg), SpO2 95%. General: Well developed, well nourished female in no acute distress. Head: Normocephalic, atraumatic, sclera non-icteric, no xanthomas, nares are without discharge. EENT: normal  Lymph Nodes:  none Neck: Negative for carotid bruits. JVD not elevated. Back:without scoliosis kyphosis Lungs: Clear bilaterally to auscultation without wheezes, rales, or rhonchi. Breathing is unlabored. Heart: RRR with S1 S2. No  murmur . No rubs, or gallops appreciated. Abdomen: Soft, non-tender, non-distended with normoactive bowel sounds. No hepatomegaly. No rebound/guarding. No obvious abdominal masses. Msk:  Strength and tone appear normal for age. Extremities: No clubbing or cyanosis. No + edema.  Distal pedal pulses are 2+ and equal bilaterally. Skin: Warm and Dry Neuro: Alert and oriented X 3. CN III-XII intact Grossly normal sensory and motor function . Psych:  Responds to questions appropriately with a normal affect.        ZHY:QMVHQ @ 73 15/13/41 RBBB LAFB   Assessment and Plan:  Dyspnea on exertion  Orthostatic lightheadedness  Fatigue  The patient has acute onset of a variety of symptoms but all following the introduction of Mounjaro.  Reviewing online, all  of her symptoms are associated with the [facility].  So the first hypothesis would be this and we will discontinue it.  She is to see her PCP next week and will discuss this further with her.  It is unlikely that it is cardiac but this needs to be considered.  A year ago she had a normal echo and 6 years ago she had a normal catheterization.  While it is possible that there has been significant intercurrent progression of cardiac disease, we will hypothesize initially not.  If her symptoms are not better in 3 weeks, she will have an appointment to follow-up with her primary cardiologist for further evaluation.        Sherryl Manges

## 2023-01-28 NOTE — Patient Instructions (Addendum)
Medication Instructions:  Your physician recommends that you continue on your current medications as directed. Please refer to the Current Medication list given to you today.   *If you need a refill on your cardiac medications before your next appointment, please call your pharmacy*   Lab Work: None ordered.  If you have labs (blood work) drawn today and your tests are completely normal, you will receive your results only by: MyChart Message (if you have MyChart) OR A paper copy in the mail If you have any lab test that is abnormal or we need to change your treatment, we will call you to review the results.   Testing/Procedures: None ordered.    Follow-Up: At [facility], you and your health needs are our priority.  As part of our continuing mission to provide you with exceptional heart care, we have created designated Provider Care Teams.  These Care Teams include your primary Cardiologist (physician) and Advanced Practice Providers (APPs -  Physician Assistants and Nurse Practitioners) who all work together to provide you with the care you need, when you need it.  We recommend signing up for the patient portal called "MyChart".  Sign up information is provided on this After Visit Summary.  MyChart is used to connect with patients for Virtual Visits (Telemedicine).  Patients are able to view lab/test results, encounter notes, upcoming appointments, etc.  Non-urgent messages can be sent to your provider as well.   To learn more about what you can do with MyChart, go to [email or web address].    Your next appointment:   3 weeks with Dr Clifton James - you will be contacted to schedule this appointment.

## 2023-02-19 ENCOUNTER — Ambulatory Visit: Payer: Medicare Other | Admitting: Physician Assistant

## 2023-03-09 ENCOUNTER — Ambulatory Visit: Payer: Medicare Other | Admitting: Cardiovascular Disease

## 2023-05-12 ENCOUNTER — Telehealth: Payer: Self-pay | Admitting: Internal Medicine

## 2023-05-12 ENCOUNTER — Other Ambulatory Visit: Payer: Self-pay

## 2023-05-12 ENCOUNTER — Emergency Department (HOSPITAL_COMMUNITY)

## 2023-05-12 ENCOUNTER — Emergency Department (HOSPITAL_COMMUNITY)
Admission: EM | Admit: 2023-05-12 | Discharge: 2023-05-12 | Disposition: A | Discharge status: Home / Self Care | Attending: Emergency Medicine | Admitting: Emergency Medicine

## 2023-05-12 ENCOUNTER — Encounter (HOSPITAL_COMMUNITY): Payer: Self-pay | Admitting: Emergency Medicine

## 2023-05-12 DIAGNOSIS — Z79899 Other long term (current) drug therapy: Secondary | ICD-10-CM | POA: Insufficient documentation

## 2023-05-12 DIAGNOSIS — R079 Chest pain, unspecified: Secondary | ICD-10-CM | POA: Insufficient documentation

## 2023-05-12 DIAGNOSIS — I10 Essential (primary) hypertension: Secondary | ICD-10-CM | POA: Diagnosis not present

## 2023-05-12 DIAGNOSIS — M542 Cervicalgia: Secondary | ICD-10-CM | POA: Insufficient documentation

## 2023-05-12 DIAGNOSIS — R42 Dizziness and giddiness: Secondary | ICD-10-CM | POA: Insufficient documentation

## 2023-05-12 DIAGNOSIS — Z7982 Long term (current) use of aspirin: Secondary | ICD-10-CM | POA: Insufficient documentation

## 2023-05-12 LAB — CBC
HCT: 42.3 % (ref 36.0–46.0)
Hemoglobin: 14.6 g/dL (ref 12.0–15.0)
MCH: 32.4 pg (ref 26.0–34.0)
MCHC: 34.5 g/dL (ref 30.0–36.0)
MCV: 93.8 fL (ref 80.0–100.0)
Platelets: 261 10*3/uL (ref 150–400)
RBC: 4.51 MIL/uL (ref 3.87–5.11)
RDW: 12.2 % (ref 11.5–15.5)
WBC: 11.1 10*3/uL — ABNORMAL HIGH (ref 4.0–10.5)
nRBC: 0 % (ref 0.0–0.2)

## 2023-05-12 LAB — BASIC METABOLIC PANEL
Anion gap: 11 (ref 5–15)
BUN: 15 mg/dL (ref 8–23)
CO2: 23 mmol/L (ref 22–32)
Calcium: 9.8 mg/dL (ref 8.9–10.3)
Chloride: 107 mmol/L (ref 98–111)
Creatinine, Ser: 1.03 mg/dL — ABNORMAL HIGH (ref 0.44–1.00)
GFR, Estimated: 56 mL/min — ABNORMAL LOW (ref >60)
Glucose, Bld: 146 mg/dL — ABNORMAL HIGH (ref 70–99)
Potassium: 4 mmol/L (ref 3.5–5.1)
Sodium: 141 mmol/L (ref 135–145)

## 2023-05-12 LAB — TROPONIN I (HIGH SENSITIVITY): Troponin I (High Sensitivity): 4 ng/L (ref <18)

## 2023-05-12 MED ORDER — IOHEXOL 350 MG/ML SOLN
75.0000 mL | Freq: Once | INTRAVENOUS | Status: AC | PRN
  Administered 2023-05-12: 75 mL via INTRAVENOUS

## 2023-05-12 NOTE — Telephone Encounter (Signed)
 Spoke with PCP and she states patient was in her office today. Patient is still having palpitations with some chest pressure. She read over Dr. Odessa Fleming last note and he stated if she was still having symptoms to schedule f/u. She was calling to get her scheduled. She was supposed to have 3 week f/u with Dr. Clifton James but did not. She states EKG was normal but besides PVC's. I have her scheduled with EP APP. She asked if we can give patient a call with appointment.  Left voicemail for patient to return call to office

## 2023-05-12 NOTE — Telephone Encounter (Signed)
 Pt called back and I gave her the appointment information

## 2023-05-12 NOTE — ED Provider Notes (Signed)
 [place] EMERGENCY DEPARTMENT AT [facility] Provider Note   CSN: [id number] Arrival date & time: 05/12/23  1326     History  Chief Complaint  Patient presents with   Dizziness    [name] is a 78 y.o. female, HLD, LVH, and HTN, who presents to the ED 2/2 to intermittent headaches, and lightheadedness, has been going on for the last month and a half.  She states in February, she looked up, and then fell backwards, and hit her head, since then she has had intermittent headaches, and lightheadedness, when she turns her head to the right, or looks up.  She states she also has had intermittent chest pain, and shortness of breath, that is exertional in nature.  With her lightheadedness, and neck pain, and headaches, she has associated nausea, and vomiting.  She also reports that her vision appears a little bit blurred, but denies any double vision, or weakness on one side of the body.  Reports just generalized weakness.  Went to see orthopedics today, and was told that she should come to the ER, for further imaging.  Home Medications Prior to Admission medications   Medication Sig Start Date End Date Taking? Authorizing Provider  acetaminophen (TYLENOL) 325 MG tablet Take 2 tablets (650 mg total) by mouth every 6 (six) hours as needed for mild pain, moderate pain or fever. 06/19/20   Shon Hale, MD  amLODipine (NORVASC) 10 MG tablet Take 1 tablet (10 mg total) by mouth daily. 06/19/20   Shon Hale, MD  aspirin EC 81 MG EC tablet Take 1 tablet (81 mg total) by mouth daily with breakfast. Swallow whole. 06/20/20   Shon Hale, MD  calcium citrate-vitamin D (CITRACAL+D) 315-200 MG-UNIT per tablet Take 1 tablet by mouth every morning.    [provider]  DULoxetine (CYMBALTA) 60 MG capsule Take 1 capsule (60 mg total) by mouth daily. 06/19/20   Shon Hale, MD  famotidine (PEPCID) 20 MG tablet Take 20 mg by mouth once as needed for heartburn. 06/09/21    [provider]  metFORMIN (GLUCOPHAGE) 500 MG tablet Take 1,000 mg by mouth in the morning and at bedtime. Patient not taking: Reported on 01/28/2023 11/23/21   [provider]  metoprolol succinate (TOPROL-XL) 25 MG 24 hr tablet Take 1 tablet (25 mg total) by mouth daily. 06/19/20   Shon Hale, MD  MOUNJARO 2.5 MG/0.5ML Pen Inject 2.5 mg into the skin once a week. 12/08/22   [provider]  Multiple Vitamin (MULTIVITAMIN WITH MINERALS) TABS tablet Take 1 tablet by mouth daily.    [provider]  VYTORIN 10-40 MG tablet Take 1 tablet by mouth at bedtime. 06/19/20   Shon Hale, MD      Allergies    Patient has no known allergies.    Review of Systems   Review of Systems  Respiratory:  Positive for shortness of breath.   Cardiovascular:  Positive for chest pain.  Neurological:  Positive for light-headedness. Negative for dizziness.    Physical Exam Updated Vital Signs BP (!) 143/61   Pulse 71   Temp 98 F (36.7 C) (Oral)   Resp 16   Ht 5\' 4"  (1.626 m)   Wt 73.5 kg   SpO2 98%   BMI 27.81 kg/m  Physical Exam Vitals and nursing note reviewed.  Constitutional:      General: She is not in acute distress.    Appearance: She is well-developed.  HENT:  Head: Normocephalic and atraumatic.  Eyes:     Conjunctiva/sclera: Conjunctivae normal.  Cardiovascular:     Rate and Rhythm: Normal rate and regular rhythm.     Heart sounds: No murmur heard. Pulmonary:     Effort: Pulmonary effort is normal. No respiratory distress.     Breath sounds: Normal breath sounds.  Abdominal:     Palpations: Abdomen is soft.     Tenderness: There is no abdominal tenderness.  Musculoskeletal:        General: No swelling.     Cervical back: Neck supple.     Comments: Tenderness to palpation of cervical spine, with lightheadedness, when patient turns head to the right, and extends neck.  Range of motion is intact, but painful.  Skin:    General: Skin  is warm and dry.     Capillary Refill: Capillary refill takes less than 2 seconds.  Neurological:     Mental Status: She is alert.  Psychiatric:        Mood and Affect: Mood normal.     ED Results / Procedures / Treatments   Labs (all labs ordered are listed, but only abnormal results are displayed) Labs Reviewed  BASIC METABOLIC PANEL - Abnormal; Notable for the following components:      Result Value   Glucose, Bld 146 (*)    Creatinine, Ser 1.03 (*)    GFR, Estimated 56 (*)    All other components within normal limits  CBC - Abnormal; Notable for the following components:   WBC 11.1 (*)    All other components within normal limits  TROPONIN I (HIGH SENSITIVITY)    EKG EKG Interpretation Date/Time:  Tuesday May 12 2023 13:33:02 EDT Ventricular Rate:  89 PR Interval:  148 QRS Duration:  139 QT Interval:  372 QTC Calculation: 453 R Axis:   264  Text Interpretation: Sinus rhythm Atrial premature complex Consider right atrial enlargement RBBB and LAFB No significant change since last tracing Confirmed by Linwood Dibbles [id number]) on 05/12/2023 1:42:07 PM  Radiology DG Chest 2 View Result Date: 05/12/2023 CLINICAL DATA:  Chest pain EXAM: CHEST - 2 VIEW COMPARISON:  06/18/2020 FINDINGS: The heart size and mediastinal contours are within normal limits. Both lungs are clear. The visualized skeletal structures are unremarkable. IMPRESSION: No active cardiopulmonary disease. Electronically Signed   By: Charlett Nose M.D.   On: 05/12/2023 18:11   CT ANGIO HEAD NECK W WO CM Result Date: 05/12/2023 CLINICAL DATA:  Syncope/presyncope, cerebrovascular cause suspected. Dizziness when turning the head towards the right. EXAM: CT ANGIOGRAPHY HEAD AND NECK WITH AND WITHOUT CONTRAST TECHNIQUE: Multidetector CT imaging of the head and neck was performed using the standard protocol during bolus administration of intravenous contrast. Multiplanar CT image reconstructions and MIPs were obtained to  evaluate the vascular anatomy. Carotid stenosis measurements (when applicable) are obtained utilizing NASCET criteria, using the distal internal carotid diameter as the denominator. RADIATION DOSE REDUCTION: This exam was performed according to the departmental dose-optimization program which includes automated exposure control, adjustment of the mA and/or kV according to patient size and/or use of iterative reconstruction technique. CONTRAST:  75mL OMNIPAQUE IOHEXOL 350 MG/ML SOLN COMPARISON:  Head CT 09/16/2019 FINDINGS: CT HEAD FINDINGS Brain: Age related volume loss. No evidence of old or acute focal infarction, mass lesion, hemorrhage, hydrocephalus or extra-axial collection. Vascular: There is atherosclerotic calcification of the major vessels at the base of the brain. Skull: Negative Sinuses/Orbits: Clear/normal Other: None Review of the MIP images confirms the  above findings CTA NECK FINDINGS Aortic arch: Aortic atherosclerosis.  Branching pattern is normal. Right carotid system: Common carotid artery widely patent to the bifurcation. Mild calcified plaque at the bifurcation but no stenosis. Cervical ICA widely patent. Left carotid system: Left common carotid artery widely patent to the bifurcation. Minimal plaque at the bifurcation but no stenosis. Cervical ICA widely patent. Vertebral arteries: No proximal subclavian stenosis. Both vertebral artery origins are widely patent. Both vertebral arteries appear normal through the cervical region to the foramen magnum. No evidence of extrinsic compression. Skeleton: Mild cervical spondylosis. Other neck: No mass or lymphadenopathy. Upper chest: Lung apices are clear. Review of the MIP images confirms the above findings CTA HEAD FINDINGS Anterior circulation: Both internal carotid arteries are patent through the skull base and siphon regions. Ordinary siphon atherosclerosis but no stenosis. The anterior and middle cerebral vessels are patent. No large vessel  occlusion or proximal stenosis. No aneurysm or vascular malformation. Posterior circulation: Both vertebral arteries are patent through the foramen magnum to the basilar artery. No basilar stenosis. Posterior circulation branch vessels are normal. Venous sinuses: Patent and normal. Anatomic variants: None significant. Review of the MIP images confirms the above findings IMPRESSION: 1. No acute head CT finding. Age related volume loss. 2. Aortic atherosclerosis. 3. Mild atherosclerotic change at both carotid bifurcations but no stenosis. 4. No intracranial large vessel occlusion or proximal stenosis. Electronically Signed   By: Paulina Fusi M.D.   On: 05/12/2023 17:56    Procedures Procedures    Medications Ordered in ED Medications  iohexol (OMNIPAQUE) 350 MG/ML injection 75 mL (75 mLs Intravenous Contrast Given 05/12/23 1614)    ED Course/ Medical Decision Making/ A&P             HEART Score: 5                    Medical Decision Making Patient is a 78 year old female, here with lightheadedness, has been going on for the month and a half, after looking upwards, and hitting head.  She has pain to her midline cervical spine, as well as pain with rotation to the right, and extension of the neck.  Will obtain a CTA head/neck, for further eval for vertebral stenosis.  We will also obtain troponins, chest x-ray, given exertional chest pain, with some shortness of breath.  Denies any recent surgeries or major trauma.  Chest pain has been going on from some time.  Denies any current chest pain, or lightheadedness  Amount and/or Complexity of Data Reviewed Labs: ordered.    Details: Blood work unremarkable Radiology: ordered.    Details: CTA head/neck, she has no acute occlusions, or severe stenosis ECG/medicine tests:  Decision-making details documented in ED Course. Discussion of management or test interpretation with external provider(s): Patient is a 78 year old female, here with 2 different  complaints, one of them being lightheadedness, when looking up, and turning her head to the right.  She also has a headache, to her occiput.  A CTA head/neck, unremarkable, does not have any severe occlusions, or stenosis.  She is overall well-appearing, this may be musculoskeletal in nature, I encouraged her to follow-up with orthopedics for this, as arterial causes have been ruled out.  Additionally, she complains of chest pain, troponin within normal limits, chest x-ray clear.  Encouraged to follow-up with cardiology.  Currently does not have any chest pain, in the ER, has just been intermittent please  Risk Prescription drug management.    Final Clinical  Impression(s) / ED Diagnoses Final diagnoses:  Lightheadedness  Neck pain  Chest pain, unspecified type    Rx / DC Orders ED Discharge Orders     None         Jaecob Lowden, [place]ley [place], PA 05/12/23 1831    Royanne Foots, DO 05/18/23 [phone]

## 2023-05-12 NOTE — Telephone Encounter (Signed)
 Patient c/o Palpitations: STAT if patient c/o lightheadedness, shortness of breath, or chest pain  How long have you had palpitations/irregular HR/ Afib? Are you having the symptoms now? A couple of weeks   Are you currently experiencing lightheadedness, SOB or CP? Having chest pain off and on, and SOB off and on but not currently  Do you have a history of afib (atrial fibrillation) or irregular heart rhythm? No  Have you checked your BP or HR? (document readings if available): BP 124/78 HR 67  Are you experiencing any other symptoms? No, but the PA is requesting a callback on her cell regarding the office visit today. Please advise

## 2023-05-12 NOTE — ED Triage Notes (Signed)
 Patient arrives POV was seen at orthopedic urgent care today. She was seen for right shoulder and neck pain since Feb. She has been experiencing dizziness and headaches since Feb. Complaints of pain at times when she turns her head.

## 2023-05-12 NOTE — Discharge Instructions (Addendum)
 Please follow-up with your primary care doctor, and your orthopedist as well as your cardiologist.  Your troponin today is normal, the chest pain, the cause is unclear, please follow-up with your cardiologist for further evaluation.  The cause of your neck pain, may be arthritic, and may be causing some headaches.  You can also have presyncope, secondary to pain, possibly due to the arthritis in your neck.  Please follow-up with the orthopedist, and primary care doctor.  You have no evidence of any narrowing in the arteries of your neck

## 2023-05-18 ENCOUNTER — Ambulatory Visit (INDEPENDENT_AMBULATORY_CARE_PROVIDER_SITE_OTHER)

## 2023-05-18 ENCOUNTER — Ambulatory Visit: Attending: Pulmonary Disease | Admitting: Pulmonary Disease

## 2023-05-18 ENCOUNTER — Encounter: Payer: Self-pay | Admitting: Pulmonary Disease

## 2023-05-18 VITALS — BP 120/74 | HR 67 | Ht 64.0 in | Wt 165.4 lb

## 2023-05-18 DIAGNOSIS — R002 Palpitations: Secondary | ICD-10-CM

## 2023-05-18 DIAGNOSIS — R072 Precordial pain: Secondary | ICD-10-CM | POA: Diagnosis not present

## 2023-05-18 DIAGNOSIS — R5383 Other fatigue: Secondary | ICD-10-CM

## 2023-05-18 DIAGNOSIS — R0600 Dyspnea, unspecified: Secondary | ICD-10-CM | POA: Diagnosis not present

## 2023-05-18 DIAGNOSIS — R42 Dizziness and giddiness: Secondary | ICD-10-CM

## 2023-05-18 NOTE — Patient Instructions (Signed)
 Medication Instructions:  Your physician recommends that you continue on your current medications as directed. Please refer to the Current Medication list given to you today.  *If you need a refill on your cardiac medications before your next appointment, please call your pharmacy*  Lab Work: None ordered If you have labs (blood work) drawn today and your tests are completely normal, you will receive your results only by: MyChart Message (if you have MyChart) OR A paper copy in the mail If you have any lab test that is abnormal or we need to change your treatment, we will call you to review the results.  Testing/Procedures: Christena Deem- Long Term Monitor Instructions  Your physician has requested you wear a ZIO patch monitor for 7 days.  This is a single patch monitor. [employer] supplies one patch monitor per enrollment. Additional stickers are not available. Please do not apply patch if you will be having a Nuclear Stress Test,  Echocardiogram, Cardiac CT, MRI, or Chest Xray during the period you would be wearing the  monitor. The patch cannot be worn during these tests. You cannot remove and re-apply the  ZIO XT patch monitor.  Your ZIO patch monitor will be mailed 3 day USPS to your address on file. It may take 3-5 days  to receive your monitor after you have been enrolled.  Once you have received your monitor, please review the enclosed instructions. Your monitor  has already been registered assigning a specific monitor serial # to you.  Billing and Patient Assistance Program Information  We have supplied [employer] with any of your insurance information on file for billing purposes. [employer] offers a sliding scale Patient Assistance Program for patients that do not have  insurance, or whose insurance does not completely cover the cost of the ZIO monitor.  You must apply for the Patient Assistance Program to qualify for this discounted rate.  To apply, please call [employer] at [phone],  select option 4, select option 2, ask to apply for  Patient Assistance Program. Meredeth Ide will ask your household income, and how many people  are in your household. They will quote your out-of-pocket cost based on that information.  [employer] will also be able to set up a 52-month, interest-free payment plan if needed.  Applying the monitor   Shave hair from upper left chest.  Hold abrader disc by orange tab. Rub abrader in 40 strokes over the upper left chest as  indicated in your monitor instructions.  Clean area with 4 enclosed alcohol pads. Let dry.  Apply patch as indicated in monitor instructions. Patch will be placed under collarbone on left  side of chest with arrow pointing upward.  Rub patch adhesive wings for 2 minutes. Remove white label marked "1". Remove the white  label marked "2". Rub patch adhesive wings for 2 additional minutes.  While looking in a mirror, press and release button in center of patch. A small green light will  flash 3-4 times. This will be your only indicator that the monitor has been turned on.  Do not shower for the first 24 hours. You may shower after the first 24 hours.  Press the button if you feel a symptom. You will hear a small click. Record Date, Time and  Symptom in the Patient Logbook.  When you are ready to remove the patch, follow instructions on the last 2 pages of Patient  Logbook. Stick patch monitor onto the last page of Patient Logbook.  Place Patient Logbook in the  blue and white box. Use locking tab on box and tape box closed  securely. The blue and white box has prepaid postage on it. Please place it in the mailbox as  soon as possible. Your physician should have your test results approximately 7 days after the  monitor has been mailed back to [facility].  Call [facility] Customer Care at [phone] if you have questions regarding  your ZIO XT patch monitor. Call them immediately if you see an orange light blinking on your   monitor.  If your monitor falls off in less than 4 days, contact our Monitor department at [phone].  If your monitor becomes loose or falls off after 4 days call [employer] at [phone] for  suggestions on securing your monitor   Follow-Up: At [facility], you and your health needs are our priority.  As part of our continuing mission to provide you with exceptional heart care, we have created designated Provider Care Teams.  These Care Teams include your primary Cardiologist (physician) and Advanced Practice Providers (APPs -  Physician Assistants and Nurse Practitioners) who all work together to provide you with the care you need, when you need it.  Your next appointment:   1 month(s)  Provider:   Canary Brim, NP

## 2023-05-18 NOTE — Progress Notes (Signed)
 Electrophysiology Office Note:   Date:  05/18/2023  ID:  [name], [name] March 21, 1945, MRN [id number]  Primary Cardiologist: Verne Carrow, MD Primary Heart Failure: None Electrophysiologist: None      History of Present Illness:   [name] is a 78 y.o. female with h/o RBBB, LAFB, HTN, GERD, esophageal stenosis, HLD, hiatal hernia, DM II seen today for acute visit due to palpitations, chest pressure, lightheadedness and shortness of breath.    Seen by Dr. Graciela Husbands in 01/2023 for orthostatic intolerance, dyspnea on exertion, N/V and pre-syncope on mounjaro > this was started and symptoms developed 2 weeks after. She has continued with the [facility] for her DM.   She was at her Ortho visit 05/11/23 for right shoulder pain and her shoulder was injected. At that time, she reported headaches / dizziness since February, neck pain when she turns her head and Ortho sent her to the ER. She reported an episode where she was looking up and got so dizzy she almost fell backward.  She was referred to the ER on 05/12/23 with intermittent chest pain / pressure and palpitations. The patient had a negative CXR, labs largely wnl (Cr 1.03, WBC 11.1) and negative troponin.  EKG with SR, known RBBB, LAD. She was discharged home.    She took her mounjaro 3/23 and worked out in the yard. She felt tired and fatigued and has felt that way for some time. She notes chest pressure intermittently > last night she had to get up and take an antacid and sit up in the chair with pressure which resolved after sitting up. She has indigestion, nausea and vomiting at times. She has a known hiatal hernia. She does get short of breath with exertion occasionally & palpitations.    She denies PND, orthopnea, nausea, vomiting, dizziness, syncope, edema, weight gain, or early satiety.   Review of systems complete and found to be negative unless listed in HPI.   EP Information / Studies Reviewed:    EKG is ordered today.  Personal review as below.  EKG Interpretation Date/Time:  Monday May 18 2023 15:56:40 EDT Ventricular Rate:  67 PR Interval:  146 QRS Duration:  132 QT Interval:  428 QTC Calculation: 452 R Axis:   -30  Text Interpretation: Normal sinus rhythm Left axis deviation Right bundle branch block Confirmed by Canary Brim ([id number]) on 05/18/2023 4:24:43 PM   Studies:  ECHO 2018 > LVEF 60-65% LHC 2018 > non-obs CAD  ECHO 04/2021 > LVEF 75%          Physical Exam:   VS:  BP 120/74   Pulse 67   Ht 5\' 4"  (1.626 m)   Wt 165 lb 6.4 oz (75 kg)   SpO2 91%   BMI 28.39 kg/m    Wt Readings from Last 3 Encounters:  05/18/23 165 lb 6.4 oz (75 kg)  05/12/23 162 lb (73.5 kg)  01/28/23 166 lb (75.3 kg)     GEN: Well nourished, well developed in no acute distress NECK: No JVD; No carotid bruits CARDIAC: Regular rate and rhythm, no murmurs, rubs, gallops RESPIRATORY:  Clear to auscultation without rales, wheezing or rhonchi  ABDOMEN: Soft, non-tender, non-distended EXTREMITIES:  No edema; No deformity   ASSESSMENT AND PLAN:    Dizziness / Lightheadedness / SOB  Nausea / Vomiting  -seems to have started 2 weeks after starting Mounjaro > discussed considering other treatment options with her PCP (copied on note) -if symptoms improve after stopping medication, then no  further cardiac work up   RBBB, LAFB Chest Discomfort  -plan for 7 day monitor to ensure no intermittent HB occurring that could cause fatigue / SOB  -if heart monitor negative & symptoms do not resolve off Mounjaro, consider Lexiscan stress testing  -recent negative troponin > less likely cardiac and may be more GI related given hiatal hernia and meds   Hypertension  -well controlled on current regimen    DM II  -encouraged patient to reach out to PCP regarding symptoms   Follow up with EP APP in 4 weeks  Signed, Canary Brim, NP-C, AGACNP-BC [facility] - Electrophysiology  05/18/2023, 5:16 PM

## 2023-05-18 NOTE — Progress Notes (Unsigned)
Enrolled for [employer] to mail a ZIO XT long term holter monitor to the patients address on file.   Dr. McAlhany to read. 

## 2023-06-15 DIAGNOSIS — R002 Palpitations: Secondary | ICD-10-CM | POA: Diagnosis not present

## 2023-06-15 DIAGNOSIS — R5383 Other fatigue: Secondary | ICD-10-CM | POA: Diagnosis not present
